# Patient Record
Sex: Female | Born: 1990 | Race: Black or African American | Hispanic: No | Marital: Single | Smoking: Current every day smoker
Health system: Southern US, Community
[De-identification: ages and names within clinical notes are randomized; demographics above are authoritative.]

## PROBLEM LIST (undated history)

## (undated) ENCOUNTER — Inpatient Hospital Stay (HOSPITAL_COMMUNITY): Payer: Self-pay

## (undated) DIAGNOSIS — A749 Chlamydial infection, unspecified: Secondary | ICD-10-CM

## (undated) DIAGNOSIS — I4891 Unspecified atrial fibrillation: Secondary | ICD-10-CM

## (undated) DIAGNOSIS — I10 Essential (primary) hypertension: Secondary | ICD-10-CM

## (undated) DIAGNOSIS — E0500 Thyrotoxicosis with diffuse goiter without thyrotoxic crisis or storm: Secondary | ICD-10-CM

## (undated) DIAGNOSIS — O1490 Unspecified pre-eclampsia, unspecified trimester: Secondary | ICD-10-CM

## (undated) DIAGNOSIS — M940 Chondrocostal junction syndrome [Tietze]: Secondary | ICD-10-CM

## (undated) DIAGNOSIS — E079 Disorder of thyroid, unspecified: Secondary | ICD-10-CM

## (undated) DIAGNOSIS — I517 Cardiomegaly: Secondary | ICD-10-CM

## (undated) DIAGNOSIS — R079 Chest pain, unspecified: Secondary | ICD-10-CM

## (undated) DIAGNOSIS — E0590 Thyrotoxicosis, unspecified without thyrotoxic crisis or storm: Secondary | ICD-10-CM

## (undated) DIAGNOSIS — A599 Trichomoniasis, unspecified: Secondary | ICD-10-CM

## (undated) HISTORY — PX: TUBAL LIGATION: SHX77

## (undated) HISTORY — DX: Chest pain, unspecified: R07.9

## (undated) HISTORY — DX: Cardiomegaly: I51.7

## (undated) HISTORY — PX: IUD REMOVAL: SHX5392

## (undated) HISTORY — DX: Chondrocostal junction syndrome (tietze): M94.0

---

## 2009-05-21 ENCOUNTER — Inpatient Hospital Stay (HOSPITAL_COMMUNITY): Admission: AD | Admit: 2009-05-21 | Discharge: 2009-05-21 | Payer: Self-pay | Admitting: Obstetrics and Gynecology

## 2009-06-01 ENCOUNTER — Ambulatory Visit: Payer: Self-pay | Admitting: Family

## 2009-06-01 ENCOUNTER — Inpatient Hospital Stay (HOSPITAL_COMMUNITY): Admission: AD | Admit: 2009-06-01 | Discharge: 2009-06-01 | Payer: Self-pay | Admitting: Obstetrics and Gynecology

## 2009-06-17 ENCOUNTER — Inpatient Hospital Stay (HOSPITAL_COMMUNITY): Admission: AD | Admit: 2009-06-17 | Discharge: 2009-06-17 | Payer: Self-pay | Admitting: Internal Medicine

## 2009-06-25 ENCOUNTER — Inpatient Hospital Stay (HOSPITAL_COMMUNITY): Admission: AD | Admit: 2009-06-25 | Discharge: 2009-06-26 | Payer: Self-pay | Admitting: Obstetrics and Gynecology

## 2009-07-24 ENCOUNTER — Ambulatory Visit: Payer: Self-pay | Admitting: Family

## 2009-07-24 ENCOUNTER — Inpatient Hospital Stay (HOSPITAL_COMMUNITY): Admission: AD | Admit: 2009-07-24 | Discharge: 2009-07-24 | Payer: Self-pay | Admitting: Obstetrics & Gynecology

## 2009-08-28 ENCOUNTER — Ambulatory Visit (HOSPITAL_COMMUNITY): Admission: RE | Admit: 2009-08-28 | Discharge: 2009-08-28 | Payer: Self-pay | Admitting: Obstetrics

## 2009-10-23 ENCOUNTER — Inpatient Hospital Stay (HOSPITAL_COMMUNITY): Admission: AD | Admit: 2009-10-23 | Discharge: 2009-10-27 | Payer: Self-pay | Admitting: Obstetrics & Gynecology

## 2009-11-01 ENCOUNTER — Emergency Department (HOSPITAL_COMMUNITY): Admission: EM | Admit: 2009-11-01 | Discharge: 2009-11-02 | Payer: Self-pay | Admitting: Emergency Medicine

## 2010-11-16 LAB — COMPREHENSIVE METABOLIC PANEL
ALT: 10 U/L (ref 0–35)
AST: 14 U/L (ref 0–37)
Albumin: 2.6 g/dL — ABNORMAL LOW (ref 3.5–5.2)
Alkaline Phosphatase: 152 U/L — ABNORMAL HIGH (ref 39–117)
BUN: 5 mg/dL — ABNORMAL LOW (ref 6–23)
Calcium: 9.4 mg/dL (ref 8.4–10.5)
Creatinine, Ser: 0.49 mg/dL (ref 0.4–1.2)
Glucose, Bld: 82 mg/dL (ref 70–99)
Potassium: 4 mEq/L (ref 3.5–5.1)
Sodium: 138 mEq/L (ref 135–145)
Total Bilirubin: 0.3 mg/dL (ref 0.3–1.2)
Total Protein: 6.1 g/dL (ref 6.0–8.3)

## 2010-11-16 LAB — CBC
HCT: 29.3 % — ABNORMAL LOW (ref 36.0–46.0)
Hemoglobin: 11.5 g/dL — ABNORMAL LOW (ref 12.0–15.0)
Hemoglobin: 9.8 g/dL — ABNORMAL LOW (ref 12.0–15.0)
MCHC: 32.7 g/dL (ref 30.0–36.0)
MCV: 85 fL (ref 78.0–100.0)
MCV: 85.6 fL (ref 78.0–100.0)
Platelets: 178 10*3/uL (ref 150–400)
Platelets: 209 10*3/uL (ref 150–400)
RBC: 4.09 MIL/uL (ref 3.87–5.11)
RDW: 15.6 % — ABNORMAL HIGH (ref 11.5–15.5)
RDW: 15.6 % — ABNORMAL HIGH (ref 11.5–15.5)
WBC: 14.4 10*3/uL — ABNORMAL HIGH (ref 4.0–10.5)
WBC: 16.2 10*3/uL — ABNORMAL HIGH (ref 4.0–10.5)

## 2010-11-16 LAB — MRSA PCR SCREENING: MRSA by PCR: NEGATIVE

## 2010-11-16 LAB — LACTATE DEHYDROGENASE: LDH: 108 U/L (ref 94–250)

## 2010-11-24 LAB — WET PREP, GENITAL: Clue Cells Wet Prep HPF POC: NONE SEEN

## 2010-11-25 LAB — URINALYSIS, ROUTINE W REFLEX MICROSCOPIC
Bilirubin Urine: NEGATIVE
Hgb urine dipstick: NEGATIVE
Nitrite: NEGATIVE
Specific Gravity, Urine: >1.03 — ABNORMAL HIGH (ref 1.005–1.030)
pH: 6 (ref 5.0–8.0)

## 2010-11-25 LAB — GC/CHLAMYDIA PROBE AMP, GENITAL: GC Probe Amp, Genital: NEGATIVE

## 2010-11-25 LAB — DIFFERENTIAL
Basophils Relative: 0 % (ref 0–1)
Lymphocytes Relative: 28 % (ref 12–46)
Lymphs Abs: 3 10*3/uL (ref 0.7–4.0)
Neutrophils Relative %: 64 % (ref 43–77)

## 2010-11-25 LAB — WET PREP, GENITAL
Clue Cells Wet Prep HPF POC: NONE SEEN
Trich, Wet Prep: NONE SEEN

## 2010-11-25 LAB — CBC
MCV: 87.2 fL (ref 78.0–100.0)
WBC: 10.6 10*3/uL — ABNORMAL HIGH (ref 4.0–10.5)

## 2010-11-25 LAB — SICKLE CELL SCREEN: Sickle Cell Screen: NEGATIVE

## 2010-11-25 LAB — TYPE AND SCREEN: Antibody Screen: NEGATIVE

## 2010-11-25 LAB — HEPATITIS B SURFACE ANTIGEN: Hepatitis B Surface Ag: NEGATIVE

## 2010-11-26 LAB — URINALYSIS, ROUTINE W REFLEX MICROSCOPIC
Glucose, UA: NEGATIVE mg/dL
Nitrite: POSITIVE — AB

## 2010-11-26 LAB — URINE CULTURE: Colony Count: >=100000

## 2010-11-26 LAB — URINE MICROSCOPIC-ADD ON

## 2010-11-27 LAB — URINALYSIS, ROUTINE W REFLEX MICROSCOPIC
Bilirubin Urine: NEGATIVE
Glucose, UA: NEGATIVE mg/dL
Ketones, ur: NEGATIVE mg/dL
Nitrite: NEGATIVE
pH: 7 (ref 5.0–8.0)

## 2010-11-27 LAB — WET PREP, GENITAL
Trich, Wet Prep: NONE SEEN
Yeast Wet Prep HPF POC: NONE SEEN

## 2010-11-27 LAB — CBC
Platelets: 231 10*3/uL (ref 150–400)
WBC: 8.8 10*3/uL (ref 4.0–10.5)

## 2011-01-29 ENCOUNTER — Emergency Department (HOSPITAL_COMMUNITY)
Admission: EM | Admit: 2011-01-29 | Discharge: 2011-01-29 | Disposition: A | Discharge status: Home / Self Care | Payer: Self-pay | Attending: Emergency Medicine | Admitting: Emergency Medicine

## 2011-01-29 DIAGNOSIS — O200 Threatened abortion: Secondary | ICD-10-CM | POA: Insufficient documentation

## 2011-01-29 DIAGNOSIS — I10 Essential (primary) hypertension: Secondary | ICD-10-CM | POA: Insufficient documentation

## 2011-01-29 LAB — URINALYSIS, ROUTINE W REFLEX MICROSCOPIC
Protein, ur: NEGATIVE mg/dL
Urobilinogen, UA: 1 mg/dL (ref 0.0–1.0)

## 2011-01-29 LAB — URINE MICROSCOPIC-ADD ON

## 2011-01-29 LAB — POCT PREGNANCY, URINE: Preg Test, Ur: POSITIVE

## 2011-01-29 LAB — HCG, QUANTITATIVE, PREGNANCY: hCG, Beta Chain, Quant, S: 549 m[IU]/mL — ABNORMAL HIGH (ref <5)

## 2011-01-29 LAB — WET PREP, GENITAL

## 2011-02-01 ENCOUNTER — Inpatient Hospital Stay (HOSPITAL_COMMUNITY): Payer: Self-pay

## 2011-02-01 ENCOUNTER — Inpatient Hospital Stay (HOSPITAL_COMMUNITY)
Admission: AD | Admit: 2011-02-01 | Discharge: 2011-02-01 | Disposition: A | Discharge status: Home / Self Care | Payer: Self-pay | Source: Ambulatory Visit | Attending: Obstetrics & Gynecology | Admitting: Obstetrics & Gynecology

## 2011-02-01 DIAGNOSIS — R52 Pain, unspecified: Secondary | ICD-10-CM

## 2011-02-01 DIAGNOSIS — O039 Complete or unspecified spontaneous abortion without complication: Secondary | ICD-10-CM

## 2011-02-01 LAB — WET PREP, GENITAL
Trich, Wet Prep: NONE SEEN
Yeast Wet Prep HPF POC: NONE SEEN

## 2011-02-01 LAB — CBC
Hemoglobin: 12.2 g/dL (ref 12.0–15.0)
MCHC: 32.5 g/dL (ref 30.0–36.0)
RBC: 4.46 MIL/uL (ref 3.87–5.11)

## 2011-02-01 LAB — HCG, QUANTITATIVE, PREGNANCY: hCG, Beta Chain, Quant, S: 80 m[IU]/mL — ABNORMAL HIGH (ref <5)

## 2011-02-03 LAB — GC/CHLAMYDIA PROBE AMP, GENITAL: Chlamydia, DNA Probe: UNDETERMINED

## 2011-02-08 ENCOUNTER — Other Ambulatory Visit: Payer: Self-pay

## 2011-02-08 DIAGNOSIS — Z0189 Encounter for other specified special examinations: Secondary | ICD-10-CM

## 2011-05-12 ENCOUNTER — Emergency Department (HOSPITAL_COMMUNITY)
Admission: EM | Admit: 2011-05-12 | Discharge: 2011-05-12 | Disposition: A | Discharge status: Home / Self Care | Payer: Medicaid Other | Attending: Emergency Medicine | Admitting: Emergency Medicine

## 2011-05-12 DIAGNOSIS — Z87891 Personal history of nicotine dependence: Secondary | ICD-10-CM | POA: Insufficient documentation

## 2011-05-12 DIAGNOSIS — I10 Essential (primary) hypertension: Secondary | ICD-10-CM | POA: Insufficient documentation

## 2011-05-12 DIAGNOSIS — R21 Rash and other nonspecific skin eruption: Secondary | ICD-10-CM | POA: Insufficient documentation

## 2011-10-04 ENCOUNTER — Encounter (HOSPITAL_COMMUNITY): Payer: Self-pay

## 2011-10-04 ENCOUNTER — Emergency Department (INDEPENDENT_AMBULATORY_CARE_PROVIDER_SITE_OTHER)
Admission: EM | Admit: 2011-10-04 | Discharge: 2011-10-04 | Disposition: A | Discharge status: Home / Self Care | Payer: Medicaid Other | Source: Home / Self Care | Attending: Family Medicine | Admitting: Family Medicine

## 2011-10-04 DIAGNOSIS — B86 Scabies: Secondary | ICD-10-CM

## 2011-10-04 MED ORDER — PERMETHRIN 5 % EX CREA: TOPICAL_CREAM | Freq: Once | CUTANEOUS | Status: AC

## 2011-10-04 NOTE — ED Provider Notes (Signed)
Marilyn Norman is a 21 y.o. female who presents to Urgent Care today for itching and bumps on her skin since August 2012.  Seen in ED at that time and told to take Benadryl for relief.  Not helping.  Sleeps with son occasionally who also has some itching on his skin and bumps.  No concurrent or prior illnesses.     PMH reviewed.  ROS as above otherwise neg Medications reviewed. No current facility-administered medications for this encounter.   Current Outpatient Prescriptions  Medication Sig Dispense Refill  . permethrin (ELIMITE) 5 % cream Apply topically once.  60 g  0    Exam:  BP 136/66  Pulse 66  Temp(Src) 98.3 F (36.8 C) (Oral)  Resp 18  SpO2 100%  LMP 09/22/2011 Gen: Well NAD HEENT: EOMI,  MMM Lungs: CTABL Nl WOB Heart: RRR no MRG Abd: NABS, NT, ND Exts: Non edematous BL  LE, warm and well perfused.  Skin:  Multiple papules scattered throughout bilateral upper extremities and lower extremities.  None noted on back.  Some excoriations noted.  Burrows noted.  Assessment and Plan: 1.  Scabies:  Plan to treat with Permethrin 5% cream.  Provided instructions.  Follow-up if no improvement. 2.  No PCP:  Provided resource sheet with list of PCP's in area.     Renold Don, MD 10/04/11 [phone]

## 2011-10-04 NOTE — ED Notes (Signed)
Diffuse reddened raise rash

## 2011-10-06 NOTE — ED Provider Notes (Signed)
Medical screening examination/treatment/procedure(s) were performed by PGY-3 FM resident and as supervising physician I was immediately available for consultation/collaboration.   Sharin Grave, MD   Sharin Grave, MD 10/06/11 [phone]

## 2012-03-17 ENCOUNTER — Encounter (HOSPITAL_COMMUNITY): Payer: Self-pay | Admitting: Emergency Medicine

## 2012-03-17 ENCOUNTER — Emergency Department (HOSPITAL_COMMUNITY)
Admission: EM | Admit: 2012-03-17 | Discharge: 2012-03-17 | Disposition: A | Discharge status: Home / Self Care | Payer: Medicaid Other | Attending: Emergency Medicine | Admitting: Emergency Medicine

## 2012-03-17 DIAGNOSIS — N921 Excessive and frequent menstruation with irregular cycle: Secondary | ICD-10-CM

## 2012-03-17 DIAGNOSIS — F17200 Nicotine dependence, unspecified, uncomplicated: Secondary | ICD-10-CM | POA: Insufficient documentation

## 2012-03-17 DIAGNOSIS — I10 Essential (primary) hypertension: Secondary | ICD-10-CM | POA: Insufficient documentation

## 2012-03-17 HISTORY — DX: Essential (primary) hypertension: I10

## 2012-03-17 LAB — URINALYSIS, ROUTINE W REFLEX MICROSCOPIC
Bilirubin Urine: NEGATIVE
Nitrite: NEGATIVE
Specific Gravity, Urine: 1.02 (ref 1.005–1.030)
pH: 6 (ref 5.0–8.0)

## 2012-03-17 LAB — URINE MICROSCOPIC-ADD ON

## 2012-03-17 LAB — POCT PREGNANCY, URINE: Preg Test, Ur: NEGATIVE

## 2012-03-17 NOTE — ED Provider Notes (Signed)
History    This chart was scribed for Joya Gaskins, MD by Sofie Rower. The patient was seen in room TR09C/TR09C and the patient's care was started at 11:28 AM    CSN: [id number]  Arrival date & time 03/17/12  1052   First MD Initiated Contact with Patient 03/17/12 1124      Chief Complaint  Patient presents with  . Late Period    Patient is a 21 y.o. female presenting with back pain. The history is provided by the patient. No language interpreter was used.  Back Pain  This is a new problem. The current episode started more than 1 week ago. The problem has not changed since onset.The pain is associated with no known injury. The pain is moderate. Pertinent negatives include no abdominal pain and no dysuria. She has tried nothing for the symptoms. The treatment provided no relief.   The pt informs the EDP that she is late in having her period, has not had one yet this month. LNMP 01/22/12.   Pt denies increased urinary frequency.     Past Medical History  Diagnosis Date  . Hypertension     Past Surgical History  Procedure Date  . Cesarean section     No family history on file.  History  Substance Use Topics  . Smoking status: Current Everyday Smoker  . Smokeless tobacco: Not on file  . Alcohol Use: Yes    OB History    Grav Para Term Preterm Abortions TAB SAB Ect Mult Living                  Review of Systems  Gastrointestinal: Negative for abdominal pain.  Genitourinary: Negative for dysuria.  Musculoskeletal: Positive for back pain.    Allergies  Strawberry  Home Medications   Current Outpatient Rx  Name Route Sig Dispense Refill  . ACETAMINOPHEN 500 MG PO TABS Oral Take 1,000 mg by mouth every 6 (six) hours as needed. For pain      BP 155/107  Pulse 65  Temp 98.7 F (37.1 C) (Oral)  Resp 20  SpO2 98%  LMP 01/22/2012  Physical Exam  CONSTITUTIONAL: Well developed/well nourished HEAD AND FACE: Normocephalic/atraumatic EYES:  EOMI/PERRL ENMT: Mucous membranes moist NECK: supple no meningeal signs SPINE:entire spine nontender CV: S1/S2 noted, no murmurs/rubs/gallops noted LUNGS: Lungs are clear to auscultation bilaterally, no apparent distress ABDOMEN: soft, nontender, no rebound or guarding GU:no cva tenderness NEURO: Pt is awake/alert, moves all extremitiesx4 EXTREMITIES: pulses normal, full ROM SKIN: warm, color normal PSYCH: no abnormalities of mood noted   ED Course  Procedures   DIAGNOSTIC STUDIES: Oxygen Saturation is 98% on room air, normal by my interpretation.    COORDINATION OF CARE:     11:31AM- EDP at bedside discusses treatment plan concerning pregnancy test.  11:46AM- EDP at bedside to discuss laboratory results.  Pt denies abdominal pain and denies vaginal bleeding Referred to GYN for followup Repots mild low back pain but exam unremarkable, no focal neuro deficits  Results for orders placed during the hospital encounter of 03/17/12  POCT PREGNANCY, URINE      Component Value Range   Preg Test, Ur NEGATIVE  NEGATIVE      1. Metrorrhagia       MDM  Nursing notes including past medical history and social history reviewed and considered in documentation labs/vitals reviewed and considered      I personally performed the services described in this documentation, which was scribed in my  presence. The recorded information has been reviewed and considered.       Joya Gaskins, MD 03/17/12 1148

## 2012-03-17 NOTE — ED Notes (Signed)
Pt reports that her period has been irregular and that she did not get a period this month. LMP 01/22/2012

## 2012-04-01 ENCOUNTER — Emergency Department (HOSPITAL_COMMUNITY): Payer: Medicaid Other

## 2012-04-01 ENCOUNTER — Encounter (HOSPITAL_COMMUNITY): Payer: Self-pay | Admitting: Emergency Medicine

## 2012-04-01 ENCOUNTER — Other Ambulatory Visit: Payer: Self-pay

## 2012-04-01 ENCOUNTER — Emergency Department (HOSPITAL_COMMUNITY)
Admission: EM | Admit: 2012-04-01 | Discharge: 2012-04-01 | Disposition: A | Discharge status: Home / Self Care | Payer: Medicaid Other | Attending: Emergency Medicine | Admitting: Emergency Medicine

## 2012-04-01 DIAGNOSIS — F17200 Nicotine dependence, unspecified, uncomplicated: Secondary | ICD-10-CM | POA: Insufficient documentation

## 2012-04-01 DIAGNOSIS — I10 Essential (primary) hypertension: Secondary | ICD-10-CM | POA: Insufficient documentation

## 2012-04-01 DIAGNOSIS — R079 Chest pain, unspecified: Secondary | ICD-10-CM | POA: Insufficient documentation

## 2012-04-01 LAB — CBC
HCT: 38.6 % (ref 36.0–46.0)
Hemoglobin: 13.2 g/dL (ref 12.0–15.0)
MCH: 29.1 pg (ref 26.0–34.0)
MCHC: 34.2 g/dL (ref 30.0–36.0)

## 2012-04-01 LAB — BASIC METABOLIC PANEL
BUN: 11 mg/dL (ref 6–23)
CO2: 25 mEq/L (ref 19–32)
Calcium: 9.3 mg/dL (ref 8.4–10.5)
GFR calc non Af Amer: >90 mL/min (ref >90)
Glucose, Bld: 83 mg/dL (ref 70–99)
Potassium: 4 mEq/L (ref 3.5–5.1)

## 2012-04-01 NOTE — ED Provider Notes (Signed)
History     CSN: [id number]  Arrival date & time 04/01/12  1059   First MD Initiated Contact with Patient 04/01/12 1126      Chief Complaint  Patient presents with  . Chest Pain    (Consider location/radiation/quality/duration/timing/severity/associated sxs/prior treatment) HPI  21 y.o. female in no acute distress with past medical history significant for hypertension complaining of left-sided chest pain worsening over the course of one week. Pain is described as 8/10 pressure-like with no radiation and worsened by movement and palpation. Patient denies any family history of early cardiac death, trauma, shortness of breath, nausea or vomiting.    Past Medical History  Diagnosis Date  . Hypertension     Past Surgical History  Procedure Date  . Cesarean section     History reviewed. No pertinent family history.  History  Substance Use Topics  . Smoking status: Current Everyday Smoker  . Smokeless tobacco: Not on file  . Alcohol Use: Yes    OB History    Grav Para Term Preterm Abortions TAB SAB Ect Mult Living                  Review of Systems  Respiratory: Negative for shortness of breath.   Cardiovascular: Positive for chest pain.  All other systems reviewed and are negative.    Allergies  Strawberry  Home Medications  No current outpatient prescriptions on file.  BP 158/90  Pulse 63  Temp 98.5 F (36.9 C) (Oral)  Resp 20  SpO2 99%  LMP 03/16/2012  Physical Exam  Vitals reviewed. Constitutional: She is oriented to person, place, and time. She appears well-developed and well-nourished. No distress.  HENT:  Head: Normocephalic.  Eyes: Conjunctivae and EOM are normal.  Cardiovascular: Normal rate, normal heart sounds and intact distal pulses.   Pulmonary/Chest: Effort normal and breath sounds normal. No respiratory distress. She has no wheezes. She has no rales. She exhibits tenderness.       Patient has reproducible chest pain to the upper left  chest. No crepitance  Musculoskeletal: Normal range of motion.  Neurological: She is alert and oriented to person, place, and time.  Psychiatric: She has a normal mood and affect.    ED Course  Procedures (including critical care time)   Labs Reviewed  CBC  BASIC METABOLIC PANEL  POCT I-STAT TROPONIN I   Dg Chest 2 View  04/01/2012  *RADIOLOGY REPORT*  Clinical Data: 3-day history of left-sided chest pain.  Smoker.  CHEST - 2 VIEW  Comparison: None.  Findings: Heart size upper normal to slightly enlarged.  Hilar and mediastinal contours otherwise unremarkable.  Lungs clear. Bronchovascular markings normal.  Pulmonary vascularity normal.  No pneumothorax.  No pleural effusions.  Visualized bony thorax intact.  IMPRESSION: Borderline heart size.  No acute cardiopulmonary disease.  Original Report Authenticated By: Arnell Sieving, M.D.     1. Chest pain       MDM  EKG and chest x-ray show no acute processes however chest x-ray shows borderline enlargement of the heart. I doubt any acute process at this point and we'll treat her as a costochondritis however I will advise her to follow immediately with primary care to evaluate her cardiomegaly in light of her hypertension at such a young age. Will also counsel her on smoking cessation.   Date: 04/01/2012  Rate: 75  Rhythm: normal sinus rhythm  QRS Axis: normal  Intervals: normal  ST/T Wave abnormalities: normal  Conduction Disutrbances:none  Narrative  Interpretation:   Old EKG Reviewed: unchanged          Wynetta Emery, PA-C 04/01/12 1244

## 2012-04-01 NOTE — ED Notes (Signed)
Pt c/o left sided CP x 1 week worse with palpation and with laying down

## 2012-04-02 NOTE — ED Provider Notes (Signed)
Medical screening examination/treatment/procedure(s) were performed by non-physician practitioner and as supervising physician I was immediately available for consultation/collaboration.  Derwood Kaplan, MD 04/02/12 1723

## 2012-04-05 ENCOUNTER — Encounter: Payer: Self-pay | Admitting: *Deleted

## 2012-04-05 DIAGNOSIS — R079 Chest pain, unspecified: Secondary | ICD-10-CM | POA: Insufficient documentation

## 2012-04-05 DIAGNOSIS — I10 Essential (primary) hypertension: Secondary | ICD-10-CM | POA: Insufficient documentation

## 2012-04-06 ENCOUNTER — Encounter: Payer: Self-pay | Admitting: *Deleted

## 2012-04-07 ENCOUNTER — Ambulatory Visit (INDEPENDENT_AMBULATORY_CARE_PROVIDER_SITE_OTHER): Payer: Medicaid Other | Admitting: Cardiovascular Disease

## 2012-04-07 ENCOUNTER — Encounter: Payer: Self-pay | Admitting: Cardiovascular Disease

## 2012-04-07 VITALS — BP 164/103 | HR 73 | Ht 64.0 in

## 2012-04-07 DIAGNOSIS — I10 Essential (primary) hypertension: Secondary | ICD-10-CM

## 2012-04-07 DIAGNOSIS — R079 Chest pain, unspecified: Secondary | ICD-10-CM

## 2012-04-07 NOTE — Patient Instructions (Signed)
Your physician recommends that you schedule a follow-up appointment in: 2-3 WEEKS WITH [facility] Your physician recommends that you continue on your current medications as directed. Please refer to the Current Medication list given to you today. Your physician has requested that you have an echocardiogram. Echocardiography is a painless test that uses sound waves to create images of your heart. It provides your doctor with information about the size and shape of your heart and how well your heart's chambers and valves are working. This procedure takes approximately one hour. There are no restrictions for this procedure. DX  401.1

## 2012-04-07 NOTE — Assessment & Plan Note (Signed)
Atypical No need for ETT unless echo abnormal

## 2012-04-07 NOTE — Progress Notes (Signed)
Patient ID: Marilyn Norman, female   DOB: 03-30-1991, 21 y.o.   MRN: [id number] 21 y.o. female in no acute distress with past medical history significant for hypertension complaining of left-sided chest pain worsening over the course of one week. Pain is described as 8/10 pressure-like with no radiation and worsened by movement and palpation. Patient denies any family history of early cardiac death, trauma, shortness of breath, nausea or vomiting.  Seen in ER 8/10 with r/o  Mild CE on CXR.  Conitnues to have atypical pain last 6 days.  Describes labile BP since pregnant 5 years ago.  Not on meds.  Father died in 13-Feb-2023 of heart disease but did drugs.  Denies dyspnea, palitations, syncope chronic kidney disease.  Cr .76 in ER and other labs normal.    ROS: Denies fever, malais, weight loss, blurry vision, decreased visual acuity, cough, sputum, SOB, hemoptysis, pleuritic pain, palpitaitons, heartburn, abdominal pain, melena, lower extremity edema, claudication, or rash.  All other systems reviewed and negative   General: Affect appropriate Healthy:  appears stated age HEENT: normal Neck supple with no adenopathy JVP normal no bruits no thyromegaly Lungs clear with no wheezing and good diaphragmatic motion Heart:  S1/S2 no murmur,rub, gallop or click PMI normal Abdomen: benighn, BS positve, no tenderness, no AAA no bruit.  No HSM or HJR Distal pulses intact with no bruits No edema Neuro non-focal Skin warm and dry No muscular weakness  Medications Current Outpatient Prescriptions  Medication Sig Dispense Refill  . ibuprofen (ADVIL,MOTRIN) 200 MG tablet Take 200 mg by mouth every 6 (six) hours as needed.        Allergies Strawberry  Family History: No family history on file.  Social History: History   Social History  . Marital Status: Single    Spouse Name: N/A    Number of Children: N/A  . Years of Education: N/A   Occupational History  . Not on file.   Social History Main  Topics  . Smoking status: Current Everyday Smoker  . Smokeless tobacco: Not on file  . Alcohol Use: No  . Drug Use: Yes    Special: Marijuana  . Sexually Active: Not on file   Other Topics Concern  . Not on file   Social History Narrative  . No narrative on file    Electrocardiogram:  8/10  SR rate 71 normal with no LVH  Assessment and Plan

## 2012-04-07 NOTE — Assessment & Plan Note (Signed)
Will likely need Rx.  Echo for LV size and function with ? CE .  She has BP cuff at home Will bring it next visit and learn how to use it Likely start Cozaar 50 mg Low sodium diet

## 2012-04-12 ENCOUNTER — Ambulatory Visit (HOSPITAL_COMMUNITY): Payer: Medicaid Other | Attending: Cardiovascular Disease | Admitting: Radiology

## 2012-04-12 DIAGNOSIS — F17200 Nicotine dependence, unspecified, uncomplicated: Secondary | ICD-10-CM | POA: Insufficient documentation

## 2012-04-12 DIAGNOSIS — I10 Essential (primary) hypertension: Secondary | ICD-10-CM

## 2012-04-12 DIAGNOSIS — I059 Rheumatic mitral valve disease, unspecified: Secondary | ICD-10-CM | POA: Insufficient documentation

## 2012-04-12 DIAGNOSIS — R072 Precordial pain: Secondary | ICD-10-CM

## 2012-04-12 DIAGNOSIS — R079 Chest pain, unspecified: Secondary | ICD-10-CM | POA: Insufficient documentation

## 2012-04-12 NOTE — Progress Notes (Signed)
Echocardiogram performed.  

## 2012-04-27 DIAGNOSIS — M940 Chondrocostal junction syndrome [Tietze]: Secondary | ICD-10-CM | POA: Insufficient documentation

## 2012-04-27 DIAGNOSIS — I517 Cardiomegaly: Secondary | ICD-10-CM | POA: Insufficient documentation

## 2012-04-28 ENCOUNTER — Ambulatory Visit: Payer: Medicaid Other | Admitting: Cardiovascular Disease

## 2012-05-17 ENCOUNTER — Encounter: Payer: Self-pay | Admitting: Cardiovascular Disease

## 2012-05-17 ENCOUNTER — Ambulatory Visit: Payer: Medicaid Other | Admitting: Cardiovascular Disease

## 2012-05-17 ENCOUNTER — Ambulatory Visit (INDEPENDENT_AMBULATORY_CARE_PROVIDER_SITE_OTHER): Payer: Medicaid Other | Admitting: Cardiovascular Disease

## 2012-05-17 VITALS — BP 152/98 | HR 79 | Ht 64.0 in | Wt 214.8 lb

## 2012-05-17 DIAGNOSIS — R079 Chest pain, unspecified: Secondary | ICD-10-CM

## 2012-05-17 DIAGNOSIS — I10 Essential (primary) hypertension: Secondary | ICD-10-CM

## 2012-05-17 NOTE — Assessment & Plan Note (Signed)
Atypical normal stress echo observe 

## 2012-05-17 NOTE — Progress Notes (Signed)
Patient ID: Marilyn Norman, female   DOB: [birth year], 21 y.o.   MRN: [id number] 21 y.o. female in no acute distress with past medical history significant for hypertension complaining of left-sided chest pain worsening over the course of one week. Pain is described as 8/10 pressure-like with no radiation and worsened by movement and palpation. Patient denies any family history of early cardiac death, trauma, shortness of breath, nausea or vomiting. Seen in ER 8/10 with r/o Mild CE on CXR. Conitnues to have atypical pain last 6 days. Describes labile BP since pregnant 5 years ago. Not on meds. Father died in 02-09-23 of heart disease but did drugs. Denies dyspnea, palitations, syncope chronic kidney disease. Cr .76 in ER and other labs normal.   Stress echo done 8/22 normal.  ROS: Denies fever, malais, weight loss, blurry vision, decreased visual acuity, cough, sputum, SOB, hemoptysis, pleuritic pain, palpitaitons, heartburn, abdominal pain, melena, lower extremity edema, claudication, or rash.  All other systems reviewed and negative  General: Affect appropriate Healthy:  appears stated age HEENT: normal Neck supple with no adenopathy JVP normal no bruits no thyromegaly Lungs clear with no wheezing and good diaphragmatic motion Heart:  S1/S2 no murmur, no rub, gallop or click PMI normal Abdomen: benighn, BS positve, no tenderness, no AAA no bruit.  No HSM or HJR Distal pulses intact with no bruits No edema Neuro non-focal Skin warm and dry No muscular weakness   Current Outpatient Prescriptions  Medication Sig Dispense Refill  . HYDROcodone-acetaminophen (NORCO/VICODIN) 5-325 MG per tablet Take 1 tablet by mouth every 6 (six) hours as needed.      Marland Kitchen ibuprofen (ADVIL,MOTRIN) 200 MG tablet Take 200 mg by mouth every 6 (six) hours as needed.        Allergies  Strawberry  Electrocardiogram:  Assessment and Plan

## 2012-05-17 NOTE — Patient Instructions (Signed)
Your physician recommends that you schedule a follow-up appointment in: AS NEEDED  Your physician recommends that you continue on your current medications as directed. Please refer to the Current Medication list given to you today.  

## 2012-05-17 NOTE — Assessment & Plan Note (Signed)
Well controlled.  Continue current medications and low sodium Dash type diet.    

## 2012-10-11 ENCOUNTER — Emergency Department (HOSPITAL_COMMUNITY): Payer: Medicaid Other

## 2012-10-11 ENCOUNTER — Other Ambulatory Visit: Payer: Self-pay | Admitting: Family Medicine

## 2012-10-11 ENCOUNTER — Emergency Department (HOSPITAL_COMMUNITY)
Admission: EM | Admit: 2012-10-11 | Discharge: 2012-10-11 | Disposition: A | Discharge status: Home / Self Care | Payer: Medicaid Other | Attending: Emergency Medicine | Admitting: Emergency Medicine

## 2012-10-11 ENCOUNTER — Encounter (HOSPITAL_COMMUNITY): Payer: Self-pay | Admitting: *Deleted

## 2012-10-11 DIAGNOSIS — N949 Unspecified condition associated with female genital organs and menstrual cycle: Secondary | ICD-10-CM | POA: Insufficient documentation

## 2012-10-11 DIAGNOSIS — O9989 Other specified diseases and conditions complicating pregnancy, childbirth and the puerperium: Secondary | ICD-10-CM | POA: Insufficient documentation

## 2012-10-11 DIAGNOSIS — I10 Essential (primary) hypertension: Secondary | ICD-10-CM | POA: Insufficient documentation

## 2012-10-11 DIAGNOSIS — Z8679 Personal history of other diseases of the circulatory system: Secondary | ICD-10-CM | POA: Insufficient documentation

## 2012-10-11 DIAGNOSIS — N898 Other specified noninflammatory disorders of vagina: Secondary | ICD-10-CM | POA: Insufficient documentation

## 2012-10-11 DIAGNOSIS — Z8739 Personal history of other diseases of the musculoskeletal system and connective tissue: Secondary | ICD-10-CM | POA: Insufficient documentation

## 2012-10-11 DIAGNOSIS — O98319 Other infections with a predominantly sexual mode of transmission complicating pregnancy, unspecified trimester: Secondary | ICD-10-CM | POA: Insufficient documentation

## 2012-10-11 DIAGNOSIS — R109 Unspecified abdominal pain: Secondary | ICD-10-CM | POA: Insufficient documentation

## 2012-10-11 DIAGNOSIS — Z87891 Personal history of nicotine dependence: Secondary | ICD-10-CM | POA: Insufficient documentation

## 2012-10-11 LAB — WET PREP, GENITAL
WBC, Wet Prep HPF POC: NONE SEEN
Yeast Wet Prep HPF POC: NONE SEEN

## 2012-10-11 LAB — HCG, QUANTITATIVE, PREGNANCY: hCG, Beta Chain, Quant, S: 733 m[IU]/mL — ABNORMAL HIGH (ref <5)

## 2012-10-11 LAB — URINALYSIS, ROUTINE W REFLEX MICROSCOPIC
Nitrite: NEGATIVE
Protein, ur: NEGATIVE mg/dL
Specific Gravity, Urine: 1.026 (ref 1.005–1.030)
Urobilinogen, UA: 1 mg/dL (ref 0.0–1.0)

## 2012-10-11 LAB — CBC WITH DIFFERENTIAL/PLATELET
Basophils Absolute: 0 10*3/uL (ref 0.0–0.1)
Eosinophils Relative: 5 % (ref 0–5)
HCT: 37.4 % (ref 36.0–46.0)
Hemoglobin: 12.6 g/dL (ref 12.0–15.0)
Lymphocytes Relative: 35 % (ref 12–46)
Lymphs Abs: 2.8 10*3/uL (ref 0.7–4.0)
MCV: 84.6 fL (ref 78.0–100.0)
Monocytes Absolute: 0.8 10*3/uL (ref 0.1–1.0)
Monocytes Relative: 10 % (ref 3–12)
RDW: 13.9 % (ref 11.5–15.5)
WBC: 8 10*3/uL (ref 4.0–10.5)

## 2012-10-11 LAB — COMPREHENSIVE METABOLIC PANEL
BUN: 9 mg/dL (ref 6–23)
CO2: 26 mEq/L (ref 19–32)
Calcium: 9.3 mg/dL (ref 8.4–10.5)
Chloride: 109 mEq/L (ref 96–112)
Creatinine, Ser: 0.63 mg/dL (ref 0.50–1.10)
GFR calc Af Amer: >90 mL/min (ref >90)
GFR calc non Af Amer: >90 mL/min (ref >90)
Glucose, Bld: 91 mg/dL (ref 70–99)
Total Bilirubin: 0.4 mg/dL (ref 0.3–1.2)

## 2012-10-11 LAB — URINE MICROSCOPIC-ADD ON

## 2012-10-11 LAB — POCT PREGNANCY, URINE: Preg Test, Ur: POSITIVE — AB

## 2012-10-11 MED ORDER — METRONIDAZOLE 500 MG PO TABS
2000.0000 mg | ORAL_TABLET | Freq: Once | ORAL | Status: AC
  Administered 2012-10-11: 2000 mg via ORAL
  Filled 2012-10-11: qty 4

## 2012-10-11 MED ORDER — CEFTRIAXONE SODIUM 250 MG IJ SOLR
250.0000 mg | Freq: Once | INTRAMUSCULAR | Status: AC
  Administered 2012-10-11: 250 mg via INTRAMUSCULAR
  Filled 2012-10-11: qty 250

## 2012-10-11 MED ORDER — LIDOCAINE HCL (PF) 1 % IJ SOLN
INTRAMUSCULAR | Status: AC
  Administered 2012-10-11: 1.5 mL via NASAL
  Filled 2012-10-11: qty 5

## 2012-10-11 MED ORDER — AZITHROMYCIN 1 G PO PACK
1.0000 g | PACK | Freq: Once | ORAL | Status: AC
  Administered 2012-10-11: 1 g via ORAL
  Filled 2012-10-11: qty 1

## 2012-10-11 MED ORDER — FOLIC ACID 1 MG PO TABS: 1.0000 mg | ORAL_TABLET | Freq: Every day | ORAL | Status: DC

## 2012-10-11 NOTE — ED Provider Notes (Addendum)
History     CSN: [id number]  Arrival date & time 10/11/12  1043   First MD Initiated Contact with Patient 10/11/12 1120      Chief Complaint  Patient presents with  . Abdominal Pain    (Consider location/radiation/quality/duration/timing/severity/associated sxs/prior treatment) HPI Comments: G2P2 patient comes in with cc of abd pain and vaginal discharge. LMP was in Jan, and she was noted to have positive U preg (this will be her 3rd pregnancy). Pt reports that for the past few days she has been having suprapubic and LLQ abd pain, with clear, but foul smelling vaginal discharge. Pt has no n/v/f/c. No vaginal bleeding.   Patient is a 22 y.o. female presenting with abdominal pain. The history is provided by the patient.  Abdominal Pain Associated symptoms: vaginal discharge   Associated symptoms: no chest pain, no dysuria, no hematuria, no nausea, no shortness of breath, no vaginal bleeding and no vomiting     Past Medical History  Diagnosis Date  . Hypertension   . Chest pain   . Cardiomegaly   . Costochondritis     Past Surgical History  Procedure Laterality Date  . Cesarean section      No family history on file.  History  Substance Use Topics  . Smoking status: Former Games developer  . Smokeless tobacco: Not on file  . Alcohol Use: No    OB History   Grav Para Term Preterm Abortions TAB SAB Ect Mult Living                  Review of Systems  Constitutional: Negative for activity change.  HENT: Negative for neck pain.   Respiratory: Negative for shortness of breath.   Cardiovascular: Negative for chest pain.  Gastrointestinal: Positive for abdominal pain. Negative for nausea and vomiting.  Genitourinary: Positive for vaginal discharge and pelvic pain. Negative for dysuria, hematuria, flank pain and vaginal bleeding.  Neurological: Negative for headaches.    Allergies  Strawberry  Home Medications   Current Outpatient Rx  Name  Route  Sig  Dispense  Refill   . acetaminophen (TYLENOL) 500 MG tablet   Oral   Take 1,000 mg by mouth every 6 (six) hours as needed for pain.           BP 140/75  Pulse 70  Temp(Src) 98.3 F (36.8 C) (Oral)  Resp 17  SpO2 98%  LMP 09/09/2012  Physical Exam  Nursing note and vitals reviewed. Constitutional: She is oriented to person, place, and time. She appears well-developed and well-nourished.  HENT:  Head: Normocephalic and atraumatic.  Eyes: Conjunctivae and EOM are normal. Pupils are equal, round, and reactive to light.  Neck: Normal range of motion. Neck supple.  Cardiovascular: Normal rate, regular rhythm, normal heart sounds and intact distal pulses.   No murmur heard. Pulmonary/Chest: Effort normal. No respiratory distress. She has no wheezes.  Abdominal: Soft. Bowel sounds are normal. She exhibits no distension. There is tenderness. There is no rebound and no guarding.  Genitourinary: Vagina normal and uterus normal.  External exam - normal, no lesions Speculum exam: Pt has some white, foul smelling discharge, no blood Bimanual exam: Patient has no CMT, no adnexal tenderness or fullness and cervical os is closed  Neurological: She is alert and oriented to person, place, and time.  Skin: Skin is warm and dry.    ED Course  Procedures (including critical care time)  Labs Reviewed  COMPREHENSIVE METABOLIC PANEL - Abnormal; Notable for the following:  Albumin 3.2 (*)    ALT 50 (*)    All other components within normal limits  URINALYSIS, ROUTINE W REFLEX MICROSCOPIC - Abnormal; Notable for the following:    APPearance HAZY (*)    Leukocytes, UA TRACE (*)    All other components within normal limits  URINE MICROSCOPIC-ADD ON - Abnormal; Notable for the following:    Squamous Epithelial / LPF MANY (*)    Bacteria, UA FEW (*)    All other components within normal limits  POCT PREGNANCY, URINE - Abnormal; Notable for the following:    Preg Test, Ur POSITIVE (*)    All other components  within normal limits  WET PREP, GENITAL  GC/CHLAMYDIA PROBE AMP  CBC WITH DIFFERENTIAL   No results found.   No diagnosis found.    MDM  Pt comes in with lower quad abd pain and vaginal discharge.  She has positive u preg. Will get [place] to r/o ectopic.  She is having unprotected intercourse, and has hx of STD - so we will treat her for GC and Chlamydia as she is very high risk for STD.   Derwood Kaplan, MD 10/11/12 1324  Derwood Kaplan, MD 10/11/12 1524  3:29 PM Trich is positive as well. BHCG is pending. [place] is equivocal. We will d.c at this time, asked to see [facility] clinic for repeat BHCG.  Derwood Kaplan, MD 10/11/12 1530

## 2012-10-11 NOTE — ED Notes (Signed)
C/o intermittent lower abd pain, L>R & foul smelling vaginal discharge x 1 week. Describes pain as "crampy". Denies n/v, urinary frequency, dysuria. Presently denies any pain.

## 2012-10-11 NOTE — ED Notes (Signed)
Pt with lower abd cramping (like menstrual cramps) x 1 week with no vaginal bleeding and diarrhea x 3 days.  Pt does c/o "smelly" unusual vaginal discharge.  Denies nausea.  LMP Jan 18.

## 2012-10-11 NOTE — ED Notes (Signed)
To ultrasound

## 2012-10-11 NOTE — ED Notes (Signed)
Patient transported to Ultrasound 

## 2012-10-13 ENCOUNTER — Encounter (HOSPITAL_COMMUNITY): Payer: Self-pay

## 2012-10-13 ENCOUNTER — Inpatient Hospital Stay (HOSPITAL_COMMUNITY)
Admission: AD | Admit: 2012-10-13 | Discharge: 2012-10-13 | Disposition: A | Discharge status: Home / Self Care | Payer: Medicaid Other | Source: Ambulatory Visit | Attending: Obstetrics & Gynecology | Admitting: Obstetrics & Gynecology

## 2012-10-13 DIAGNOSIS — A5619 Other chlamydial genitourinary infection: Secondary | ICD-10-CM | POA: Insufficient documentation

## 2012-10-13 DIAGNOSIS — O98319 Other infections with a predominantly sexual mode of transmission complicating pregnancy, unspecified trimester: Secondary | ICD-10-CM | POA: Insufficient documentation

## 2012-10-13 DIAGNOSIS — N739 Female pelvic inflammatory disease, unspecified: Secondary | ICD-10-CM | POA: Insufficient documentation

## 2012-10-13 LAB — HCG, QUANTITATIVE, PREGNANCY: hCG, Beta Chain, Quant, S: 1627 m[IU]/mL — ABNORMAL HIGH (ref <5)

## 2012-10-13 MED ORDER — AZITHROMYCIN 250 MG PO TABS
1000.0000 mg | ORAL_TABLET | Freq: Once | ORAL | Status: DC
  Filled 2012-10-13: qty 4

## 2012-10-13 MED ORDER — METRONIDAZOLE 500 MG PO TABS
2000.0000 mg | ORAL_TABLET | Freq: Once | ORAL | Status: DC
  Filled 2012-10-13: qty 4

## 2012-10-13 NOTE — ED Notes (Signed)
+   Chlamydia Patient treated with Rocephin and Zithromax-[employer]HS letter faxed 

## 2012-10-13 NOTE — MAU Note (Signed)
Patient to MAU for repeat BHCG. Patient denies any pain or bleeding. Will be treated today for trich and a positive CT.

## 2012-10-13 NOTE — MAU Provider Note (Signed)
Attestation of Attending Supervision of Advanced Practitioner (PA/CNM/NP): Evaluation and management procedures were performed by the Advanced Practitioner under my supervision and collaboration.  I have reviewed the Advanced Practitioner's note and chart, and I agree with the management and plan.  Theone Bowell, MD, FACOG Attending Obstetrician & Gynecologist Faculty Practice, [facility]  

## 2012-10-13 NOTE — MAU Provider Note (Signed)
Marilyn Norman is a 22 y.o. G1P0 at [redacted]w[redacted]d by LMP here for repeat quant HCG. Seen at [facility] ED on 2/19 with abd pain and vaginal discharge. Quant was 733, no IUP or ectopic on u/s. Was treated for Trich with Flagyl at that time, was also given Rocephin and Azithromycin presumptively for GC/CT. Chlamydia has since come back positive, has not been sexually active since treatment and states she has informed partner he needs treatment.   Filed Vitals:   10/13/12 0946  BP: 143/71  Pulse: 100  Temp: 98.1 F (36.7 C)  Resp: 18   Gen: well, no distress  Results for orders placed during the hospital encounter of 10/13/12 (from the past 24 hour(s))  HCG, QUANTITATIVE, PREGNANCY     Status: Abnormal   Collection Time    10/13/12  9:38 AM      Result Value Range   hCG, Beta Chain, Quant, S 1627 (*) <5 mIU/mL    A/P: 22 y.o. G1P0 at [redacted]w[redacted]d  Follow up in 7 days for repeat u/s for viability Chlamydia and Trich already treated, rev'd partner treatment and no sex until 2 weeks s/p treatment

## 2012-10-14 ENCOUNTER — Telehealth (HOSPITAL_COMMUNITY): Payer: Self-pay | Admitting: Emergency Medicine

## 2012-10-14 NOTE — ED Notes (Signed)
Patient was informed of +Chlamydia when at [facility] on 2/21

## 2012-10-20 ENCOUNTER — Inpatient Hospital Stay (HOSPITAL_COMMUNITY)
Admission: AD | Admit: 2012-10-20 | Discharge: 2012-10-20 | Disposition: A | Discharge status: Home / Self Care | Payer: Medicaid Other | Source: Ambulatory Visit | Attending: Obstetrics and Gynecology | Admitting: Obstetrics and Gynecology

## 2012-10-20 ENCOUNTER — Ambulatory Visit (HOSPITAL_COMMUNITY)
Admit: 2012-10-20 | Discharge: 2012-10-20 | Disposition: A | Discharge status: Home / Self Care | Payer: Medicaid Other | Attending: Obstetrics & Gynecology | Admitting: Obstetrics & Gynecology

## 2012-10-20 ENCOUNTER — Ambulatory Visit (HOSPITAL_COMMUNITY): Payer: Medicaid Other

## 2012-10-20 DIAGNOSIS — Z3689 Encounter for other specified antenatal screening: Secondary | ICD-10-CM | POA: Insufficient documentation

## 2012-10-20 DIAGNOSIS — O3680X0 Pregnancy with inconclusive fetal viability, not applicable or unspecified: Secondary | ICD-10-CM | POA: Insufficient documentation

## 2012-10-20 NOTE — MAU Note (Signed)
[place] for viability. No complaints.

## 2012-10-20 NOTE — MAU Provider Note (Signed)
Marilyn Norman is a 22 y.o. G1P0 at [redacted]w[redacted]d here for repeat u/s for viability. No pain or bleeding today.     A/P: 22 y.o. G1P0 at [redacted]w[redacted]d with IUP Start prenatal care as soon as possible, precautions rev'd Pregnancy verification given  1. Normal IUP (intrauterine pregnancy) on prenatal ultrasound       Medication List    TAKE these medications       acetaminophen 500 MG tablet  Commonly known as:  TYLENOL  Take 1,000 mg by mouth every 6 (six) hours as needed for pain.     folic acid 1 MG tablet  Commonly known as:  FOLVITE  Take 1 tablet (1 mg total) by mouth daily.            Follow-up Information   Follow up with provider of your choice. (start prenatal care as soon as possible)

## 2012-10-21 ENCOUNTER — Encounter (HOSPITAL_COMMUNITY): Payer: Self-pay | Admitting: Advanced Practice Midwife

## 2012-10-25 NOTE — MAU Provider Note (Signed)
Attestation of Attending Supervision of Advanced Practitioner (CNM/NP): Evaluation and management procedures were performed by the Advanced Practitioner under my supervision and collaboration.  I have reviewed the Advanced Practitioner's note and chart, and I agree with the management and plan.  Dot Splinter 10/25/2012 9:11 AM   

## 2012-10-30 ENCOUNTER — Encounter (HOSPITAL_COMMUNITY): Payer: Self-pay

## 2012-10-30 ENCOUNTER — Inpatient Hospital Stay (HOSPITAL_COMMUNITY)
Admission: AD | Admit: 2012-10-30 | Discharge: 2012-10-31 | Disposition: A | Discharge status: Home / Self Care | Payer: Medicaid Other | Source: Ambulatory Visit | Attending: Obstetrics | Admitting: Obstetrics

## 2012-10-30 DIAGNOSIS — O021 Missed abortion: Secondary | ICD-10-CM | POA: Insufficient documentation

## 2012-10-30 DIAGNOSIS — O039 Complete or unspecified spontaneous abortion without complication: Secondary | ICD-10-CM

## 2012-10-30 LAB — URINALYSIS, ROUTINE W REFLEX MICROSCOPIC
Ketones, ur: NEGATIVE mg/dL
Leukocytes, UA: NEGATIVE
Nitrite: NEGATIVE
Protein, ur: NEGATIVE mg/dL

## 2012-10-30 NOTE — MAU Note (Signed)
PT states that she has been spotting since this morning. States that it is only when she wipes. Denies cramping.

## 2012-10-31 ENCOUNTER — Inpatient Hospital Stay (HOSPITAL_COMMUNITY): Payer: Medicaid Other

## 2012-10-31 NOTE — MAU Provider Note (Signed)
History     CSN: [id number]  Arrival date and time: 10/30/12 2254   None     Chief Complaint  Patient presents with  . Vaginal Bleeding   HPI Marilyn Norman is a 22 y.o. female @ [redacted]w[redacted]d gestation who presents to MAU with vaginal bleeding. The bleeding started today. She describes the bleeding as lighter than a period. She was evaluated here 2/28 and had an IUP but the heart rate was 85. She has an appointment with Dr. Gaynell Face tomorrow. The history was provided by the patient and her medical record.  OB History   Grav Para Term Preterm Abortions TAB SAB Ect Mult Living   3 2 2       2       Past Medical History  Diagnosis Date  . Hypertension   . Chest pain   . Cardiomegaly   . Costochondritis     Past Surgical History  Procedure Laterality Date  . Cesarean section      Family History  Problem Relation Age of Onset  . Asthma Mother   . Hearing loss Father     History  Substance Use Topics  . Smoking status: Former Games developer  . Smokeless tobacco: Not on file  . Alcohol Use: No    Allergies:  Allergies  Allergen Reactions  . Strawberry Itching and Rash    Prescriptions prior to admission  Medication Sig Dispense Refill  . acetaminophen (TYLENOL) 500 MG tablet Take 1,000 mg by mouth every 6 (six) hours as needed for pain.      . folic acid (FOLVITE) 1 MG tablet Take 1 tablet (1 mg total) by mouth daily.  100 tablet  0    Review of Systems  Constitutional: Negative for fever and chills.  Eyes: Negative for blurred vision and double vision.  Respiratory: Negative for cough and wheezing.   Cardiovascular: Negative for chest pain.  Gastrointestinal: Positive for nausea. Negative for abdominal pain.  Genitourinary:       Vaginal bleeding  Musculoskeletal: Negative for back pain.  Neurological: Negative for dizziness and headaches.  Psychiatric/Behavioral: Negative for depression. The patient is not nervous/anxious.    Physical Exam   Blood pressure  135/71, pulse 94, temperature 98.8 F (37.1 C), temperature source Oral, resp. rate 18, height 5\' 4"  (1.626 m), weight 218 lb 6.4 oz (99.066 kg), last menstrual period 09/09/2012, SpO2 100.00%.  Physical Exam  Nursing note and vitals reviewed. Constitutional: She is oriented to person, place, and time. She appears well-developed and well-nourished. No distress.  HENT:  Head: Normocephalic and atraumatic.  Eyes: EOM are normal.  Neck: Neck supple.  Cardiovascular: Normal rate.   Respiratory: Effort normal.  Musculoskeletal: Normal range of motion.  Neurological: She is alert and oriented to person, place, and time.  Skin: Skin is warm and dry.  Psychiatric: She has a normal mood and affect. Her behavior is normal. Judgment and thought content normal.   US Ob Transvaginal  10/31/2012  *RADIOLOGY REPORT*  Clinical Data: Bleeding, pregnant.  OBSTETRIC <14 WK ULTRASOUND  Technique:  Transabdominal ultrasound was performed for evaluation of the gestation as well as the maternal uterus and adnexal regions.  Comparison:  10/20/2012  Intrauterine gestational sac: Visualized/normal in shape. Yolk sac: Identified Embryo: Identified Cardiac Activity: Not identified  CRL:  8.5 mm  7 w  0 d  Maternal uterus/Adnexae: No subchorionic hemorrhage.  Normal sonographic appearance to the ovaries.  Corpus luteal cyst on the right.  No free fluid.  IMPRESSION: Intrauterine gestation without cardiac activity. At 8.5 mm, suspicious for nonviable/failed first trimester pregnancy. Recommend short-term follow-up.   Original Report Authenticated By: Jearld Lesch, M.D.     Blood type O positive  Procedures  Assessment: 22 y.o. female @ [redacted]w[redacted]d gestation with failed pregnancy  Plan:  Keep appointment with Dr. Gaynell Face tomorrow, return here as needed.   Instructions on miscarriage  NEESE,HOPE, RN, FNP, [facility] 10/31/2012, 12:45 AM

## 2013-01-18 ENCOUNTER — Encounter (HOSPITAL_COMMUNITY): Payer: Self-pay

## 2013-01-18 ENCOUNTER — Emergency Department (HOSPITAL_COMMUNITY)
Admission: EM | Admit: 2013-01-18 | Discharge: 2013-01-18 | Discharge status: Against Medical Advice | Payer: Medicaid Other | Attending: Emergency Medicine | Admitting: Emergency Medicine

## 2013-01-18 DIAGNOSIS — N949 Unspecified condition associated with female genital organs and menstrual cycle: Secondary | ICD-10-CM | POA: Insufficient documentation

## 2013-01-18 DIAGNOSIS — Z87891 Personal history of nicotine dependence: Secondary | ICD-10-CM | POA: Insufficient documentation

## 2013-01-18 DIAGNOSIS — Z3202 Encounter for pregnancy test, result negative: Secondary | ICD-10-CM | POA: Insufficient documentation

## 2013-01-18 DIAGNOSIS — I10 Essential (primary) hypertension: Secondary | ICD-10-CM | POA: Insufficient documentation

## 2013-01-18 NOTE — ED Notes (Signed)
Patient presents to ED c/o vaginal swelling and itching that started yesterday. Pt denies any new soaps or detergents. Pt states that she has burning with urination. Denies any vaginal discharge.

## 2013-01-18 NOTE — ED Notes (Signed)
NURSE FIRST ROUNDS : UNABLE TO LOCATE PT. AT TRIAGE AND WAIITNG AREA SEVERAL TIMES.

## 2013-01-19 ENCOUNTER — Encounter (HOSPITAL_COMMUNITY): Payer: Self-pay | Admitting: Emergency Medicine

## 2013-01-19 ENCOUNTER — Emergency Department (HOSPITAL_COMMUNITY)
Admission: EM | Admit: 2013-01-19 | Discharge: 2013-01-19 | Disposition: A | Discharge status: Home / Self Care | Payer: Medicaid Other | Attending: Emergency Medicine | Admitting: Emergency Medicine

## 2013-01-19 DIAGNOSIS — Z8739 Personal history of other diseases of the musculoskeletal system and connective tissue: Secondary | ICD-10-CM | POA: Insufficient documentation

## 2013-01-19 DIAGNOSIS — Z202 Contact with and (suspected) exposure to infections with a predominantly sexual mode of transmission: Secondary | ICD-10-CM | POA: Insufficient documentation

## 2013-01-19 DIAGNOSIS — N390 Urinary tract infection, site not specified: Secondary | ICD-10-CM | POA: Insufficient documentation

## 2013-01-19 DIAGNOSIS — A5901 Trichomonal vulvovaginitis: Secondary | ICD-10-CM | POA: Insufficient documentation

## 2013-01-19 DIAGNOSIS — B373 Candidiasis of vulva and vagina: Secondary | ICD-10-CM | POA: Insufficient documentation

## 2013-01-19 DIAGNOSIS — Z8679 Personal history of other diseases of the circulatory system: Secondary | ICD-10-CM | POA: Insufficient documentation

## 2013-01-19 DIAGNOSIS — L293 Anogenital pruritus, unspecified: Secondary | ICD-10-CM | POA: Insufficient documentation

## 2013-01-19 DIAGNOSIS — I10 Essential (primary) hypertension: Secondary | ICD-10-CM | POA: Insufficient documentation

## 2013-01-19 DIAGNOSIS — B3731 Acute candidiasis of vulva and vagina: Secondary | ICD-10-CM | POA: Insufficient documentation

## 2013-01-19 DIAGNOSIS — R011 Cardiac murmur, unspecified: Secondary | ICD-10-CM | POA: Insufficient documentation

## 2013-01-19 DIAGNOSIS — N898 Other specified noninflammatory disorders of vagina: Secondary | ICD-10-CM | POA: Insufficient documentation

## 2013-01-19 DIAGNOSIS — A64 Unspecified sexually transmitted disease: Secondary | ICD-10-CM

## 2013-01-19 DIAGNOSIS — Z87891 Personal history of nicotine dependence: Secondary | ICD-10-CM | POA: Insufficient documentation

## 2013-01-19 DIAGNOSIS — R300 Dysuria: Secondary | ICD-10-CM | POA: Insufficient documentation

## 2013-01-19 DIAGNOSIS — Z3202 Encounter for pregnancy test, result negative: Secondary | ICD-10-CM | POA: Insufficient documentation

## 2013-01-19 LAB — URINALYSIS, ROUTINE W REFLEX MICROSCOPIC
Glucose, UA: NEGATIVE mg/dL
Hgb urine dipstick: NEGATIVE
Specific Gravity, Urine: 1.019 (ref 1.005–1.030)
pH: 5.5 (ref 5.0–8.0)

## 2013-01-19 LAB — WET PREP, GENITAL: Clue Cells Wet Prep HPF POC: NONE SEEN

## 2013-01-19 LAB — PREGNANCY, URINE: Preg Test, Ur: NEGATIVE

## 2013-01-19 LAB — URINE MICROSCOPIC-ADD ON

## 2013-01-19 MED ORDER — LIDOCAINE HCL (PF) 1 % IJ SOLN
INTRAMUSCULAR | Status: AC
  Administered 2013-01-19: 0.9 mL
  Filled 2013-01-19: qty 5

## 2013-01-19 MED ORDER — FLUCONAZOLE 200 MG PO TABS: 200.0000 mg | ORAL_TABLET | Freq: Every day | ORAL | Status: AC

## 2013-01-19 MED ORDER — FLUCONAZOLE 100 MG PO TABS: 150.0000 mg | ORAL_TABLET | Freq: Once | ORAL | Status: DC

## 2013-01-19 MED ORDER — CEFTRIAXONE SODIUM 250 MG IJ SOLR
250.0000 mg | Freq: Once | INTRAMUSCULAR | Status: AC
  Administered 2013-01-19: 250 mg via INTRAMUSCULAR
  Filled 2013-01-19: qty 250

## 2013-01-19 MED ORDER — SULFAMETHOXAZOLE-TRIMETHOPRIM 800-160 MG PO TABS: 1.0000 | ORAL_TABLET | Freq: Two times a day (BID) | ORAL | Status: DC

## 2013-01-19 MED ORDER — METRONIDAZOLE 500 MG PO TABS
2000.0000 mg | ORAL_TABLET | Freq: Once | ORAL | Status: AC
  Administered 2013-01-19: 2000 mg via ORAL
  Filled 2013-01-19: qty 4

## 2013-01-19 MED ORDER — AZITHROMYCIN 250 MG PO TABS
1000.0000 mg | ORAL_TABLET | Freq: Once | ORAL | Status: AC
  Administered 2013-01-19: 1000 mg via ORAL
  Filled 2013-01-19: qty 4

## 2013-01-19 NOTE — ED Notes (Signed)
Vaginal pain and itching x 3 days hurts to void has a d/c

## 2013-01-19 NOTE — ED Provider Notes (Signed)
Medical screening examination/treatment/procedure(s) were performed by non-physician practitioner and as supervising physician I was immediately available for consultation/collaboration.   Joya Gaskins, MD 01/19/13 1012

## 2013-01-19 NOTE — ED Provider Notes (Signed)
History     CSN: [id number]  Arrival date & time 01/19/13  0828   First MD Initiated Contact with Patient 01/19/13 0831      Chief Complaint  Patient presents with  . Vaginal Pain    (Consider location/radiation/quality/duration/timing/severity/associated sxs/prior treatment) HPI  22 year old female who recently had a failed first trimester pregnancy last seen on transvaginal ultrasound performed on 10/31/12 presents c/o dysuria and vaginal itching. Patient reports for the past 3 days she has burning when urinating and also some itching sensation. Symptom has been persistent. She noticed some mild vaginal discharge without any significant odor. No specific treatment tried. No complaint of fever, chills, nausea, vomiting, diarrhea, abdominal pain, back pain, hematuria, hematochezia, or melena. Patient is a G4 P2. Patient has prior history of STDs including trichomonas and chlamydia as recent as February of this year. She is sexually active using protection.     Past Medical History  Diagnosis Date  . Hypertension   . Chest pain   . Cardiomegaly   . Costochondritis     Past Surgical History  Procedure Laterality Date  . Cesarean section      Family History  Problem Relation Age of Onset  . Asthma Mother   . Hearing loss Father     History  Substance Use Topics  . Smoking status: Former Games developer  . Smokeless tobacco: Not on file  . Alcohol Use: No    OB History   Grav Para Term Preterm Abortions TAB SAB Ect Mult Living   3 2 2       2       Review of Systems  All other systems reviewed and are negative.    Allergies  Strawberry  Home Medications   Current Outpatient Rx  Name  Route  Sig  Dispense  Refill  . acetaminophen (TYLENOL) 500 MG tablet   Oral   Take 1,000 mg by mouth every 4 (four) hours as needed for pain.          Marland Kitchen ibuprofen (ADVIL,MOTRIN) 200 MG tablet   Oral   Take 400 mg by mouth daily as needed for pain.           BP 169/81   Pulse 89  Resp 16  SpO2 99%  LMP 12/28/2012  Physical Exam  Nursing note and vitals reviewed. Constitutional: She appears well-developed and well-nourished. No distress.  HENT:  Head: Normocephalic and atraumatic.  Eyes: Conjunctivae are normal.  Neck: Normal range of motion. Neck supple.  Cardiovascular: Normal rate and regular rhythm.   Murmur (2 out 6 systolic murmur) heard. Pulmonary/Chest: Effort normal and breath sounds normal. She exhibits no tenderness.  Abdominal: Soft. There is no tenderness.  Genitourinary: Vagina normal and uterus normal. There is no rash or lesion on the right labia. There is no rash or lesion on the left labia. Cervix exhibits no motion tenderness and no discharge. Right adnexum displays no mass and no tenderness. Left adnexum displays no mass and no tenderness. No erythema, tenderness or bleeding around the vagina. No vaginal discharge found.  Chaperone present:  Curd-like discharge noted in vaginal vault.  No evidence suggestive of PID.    Lymphadenopathy:       Right: No inguinal adenopathy present.       Left: No inguinal adenopathy present.    ED Course  Procedures (including critical care time)  9:10 AM Dysuria and vaginal discharge, prior hx of STD.  Work up initiated.   9:46 AM Her UA  shows evidence of urinary tract infection. Patient will be treated with Bactrim. She also has evidence of yeast infection. She will be treated with Diflucan. She is positive for Trichomonas infection. She will receive 2 g of Flagyl here in the ED. Many  white blood cells on the wet prep. Will prophylactically treat for possible gonorrhea and Chlamydia infection with Rocephin/Zithromax. Patient were notified to avoid all sexual activities, notified her partner, and using protection each time she's having sexual activities. Patient voiced understanding and agrees with plan.  Labs Reviewed  WET PREP, GENITAL - Abnormal; Notable for the following:    Yeast Wet Prep  HPF POC FEW (*)    Trich, Wet Prep FEW (*)    WBC, Wet Prep HPF POC MANY (*)    All other components within normal limits  URINALYSIS, ROUTINE W REFLEX MICROSCOPIC - Abnormal; Notable for the following:    APPearance CLOUDY (*)    Leukocytes, UA LARGE (*)    All other components within normal limits  URINE MICROSCOPIC-ADD ON - Abnormal; Notable for the following:    Squamous Epithelial / LPF MANY (*)    Bacteria, UA FEW (*)    All other components within normal limits  GC/CHLAMYDIA PROBE AMP  URINE CULTURE  PREGNANCY, URINE   No results found.   1. STD (female)   2. Vaginal candidiasis   3. Trichomonal vaginitis   4. UTI (lower urinary tract infection)       MDM  BP 169/81  Pulse 89  Resp 16  SpO2 99%  LMP 12/28/2012  I have reviewed nursing notes and vital signs.  I reviewed available ER/hospitalization records thought the EMR         Fayrene Helper, [place] 01/19/13 7829

## 2013-01-20 LAB — URINE CULTURE

## 2013-05-10 ENCOUNTER — Other Ambulatory Visit: Payer: Self-pay | Admitting: Otolaryngology

## 2013-05-10 DIAGNOSIS — E049 Nontoxic goiter, unspecified: Secondary | ICD-10-CM

## 2013-05-10 DIAGNOSIS — R599 Enlarged lymph nodes, unspecified: Secondary | ICD-10-CM

## 2013-05-11 ENCOUNTER — Ambulatory Visit
Admission: RE | Admit: 2013-05-11 | Discharge: 2013-05-11 | Disposition: A | Discharge status: Home / Self Care | Payer: Medicaid Other | Source: Ambulatory Visit | Attending: Otolaryngology | Admitting: Otolaryngology

## 2013-05-11 DIAGNOSIS — R599 Enlarged lymph nodes, unspecified: Secondary | ICD-10-CM

## 2013-05-11 DIAGNOSIS — E049 Nontoxic goiter, unspecified: Secondary | ICD-10-CM

## 2013-05-14 ENCOUNTER — Telehealth: Payer: Self-pay | Admitting: Emergency Medicine

## 2013-05-14 NOTE — Telephone Encounter (Signed)
Called [place] w/ Dr Emeline Darling about [place] results from Friday.  According to report there is nothing to Bx.  Per Rad, to f/u ct neck/chest in future.

## 2013-05-17 ENCOUNTER — Ambulatory Visit: Payer: Medicaid Other | Admitting: Internal Medicine

## 2013-05-31 ENCOUNTER — Ambulatory Visit: Payer: Medicaid Other | Admitting: Internal Medicine

## 2013-06-21 ENCOUNTER — Emergency Department (HOSPITAL_COMMUNITY): Payer: Medicaid Other

## 2013-06-21 ENCOUNTER — Encounter (HOSPITAL_COMMUNITY): Payer: Self-pay | Admitting: Emergency Medicine

## 2013-06-21 ENCOUNTER — Emergency Department (HOSPITAL_COMMUNITY)
Admission: EM | Admit: 2013-06-21 | Discharge: 2013-06-21 | Disposition: A | Discharge status: Home / Self Care | Payer: Medicaid Other | Attending: Emergency Medicine | Admitting: Emergency Medicine

## 2013-06-21 DIAGNOSIS — N949 Unspecified condition associated with female genital organs and menstrual cycle: Secondary | ICD-10-CM | POA: Insufficient documentation

## 2013-06-21 DIAGNOSIS — O210 Mild hyperemesis gravidarum: Secondary | ICD-10-CM | POA: Insufficient documentation

## 2013-06-21 DIAGNOSIS — I10 Essential (primary) hypertension: Secondary | ICD-10-CM | POA: Insufficient documentation

## 2013-06-21 DIAGNOSIS — O9989 Other specified diseases and conditions complicating pregnancy, childbirth and the puerperium: Secondary | ICD-10-CM | POA: Insufficient documentation

## 2013-06-21 DIAGNOSIS — A599 Trichomoniasis, unspecified: Secondary | ICD-10-CM | POA: Insufficient documentation

## 2013-06-21 DIAGNOSIS — N898 Other specified noninflammatory disorders of vagina: Secondary | ICD-10-CM | POA: Insufficient documentation

## 2013-06-21 DIAGNOSIS — Z8639 Personal history of other endocrine, nutritional and metabolic disease: Secondary | ICD-10-CM | POA: Insufficient documentation

## 2013-06-21 DIAGNOSIS — Z862 Personal history of diseases of the blood and blood-forming organs and certain disorders involving the immune mechanism: Secondary | ICD-10-CM | POA: Insufficient documentation

## 2013-06-21 DIAGNOSIS — Z3490 Encounter for supervision of normal pregnancy, unspecified, unspecified trimester: Secondary | ICD-10-CM

## 2013-06-21 DIAGNOSIS — O98819 Other maternal infectious and parasitic diseases complicating pregnancy, unspecified trimester: Secondary | ICD-10-CM | POA: Insufficient documentation

## 2013-06-21 DIAGNOSIS — O169 Unspecified maternal hypertension, unspecified trimester: Secondary | ICD-10-CM | POA: Insufficient documentation

## 2013-06-21 DIAGNOSIS — Z87891 Personal history of nicotine dependence: Secondary | ICD-10-CM | POA: Insufficient documentation

## 2013-06-21 DIAGNOSIS — Z79899 Other long term (current) drug therapy: Secondary | ICD-10-CM | POA: Insufficient documentation

## 2013-06-21 HISTORY — DX: Disorder of thyroid, unspecified: E07.9

## 2013-06-21 LAB — CBC WITH DIFFERENTIAL/PLATELET
HCT: 31.2 % — ABNORMAL LOW (ref 36.0–46.0)
Hemoglobin: 10.8 g/dL — ABNORMAL LOW (ref 12.0–15.0)
Lymphocytes Relative: 38 % (ref 12–46)
MCHC: 34.6 g/dL (ref 30.0–36.0)
Monocytes Absolute: 0.8 10*3/uL (ref 0.1–1.0)
Monocytes Relative: 10 % (ref 3–12)
Neutro Abs: 3.9 10*3/uL (ref 1.7–7.7)
WBC: 7.7 10*3/uL (ref 4.0–10.5)

## 2013-06-21 LAB — URINALYSIS, ROUTINE W REFLEX MICROSCOPIC
Bilirubin Urine: NEGATIVE
Glucose, UA: NEGATIVE mg/dL
Hgb urine dipstick: NEGATIVE
Ketones, ur: NEGATIVE mg/dL
pH: 7 (ref 5.0–8.0)

## 2013-06-21 LAB — BASIC METABOLIC PANEL
BUN: 7 mg/dL (ref 6–23)
CO2: 24 mEq/L (ref 19–32)
Chloride: 102 mEq/L (ref 96–112)
Creatinine, Ser: 0.34 mg/dL — ABNORMAL LOW (ref 0.50–1.10)

## 2013-06-21 LAB — WET PREP, GENITAL: Clue Cells Wet Prep HPF POC: NONE SEEN

## 2013-06-21 MED ORDER — PRENATAL COMPLETE 14-0.4 MG PO TABS: 1.0000 | ORAL_TABLET | Freq: Every day | ORAL | Status: DC

## 2013-06-21 MED ORDER — METRONIDAZOLE 500 MG PO TABS
2000.0000 mg | ORAL_TABLET | Freq: Once | ORAL | Status: AC
  Administered 2013-06-21: 2000 mg via ORAL
  Filled 2013-06-21: qty 4

## 2013-06-21 NOTE — ED Notes (Signed)
Pt states for the past week she has been having throbbing lower pelvic pain.  Pt states she has not had a menstrual cycle since august but states this is not abnormal.  Pt states she is not using birth control, but is not sexually active.

## 2013-06-21 NOTE — ED Provider Notes (Signed)
CSN: [id number]     Arrival date & time 06/21/13  1526 History   First MD Initiated Contact with Patient 06/21/13 1933     Chief Complaint  Patient presents with  . Pelvic Pain  . Vaginal Discharge   (Consider location/radiation/quality/duration/timing/severity/associated sxs/prior Treatment) HPI Pt reports lower abdominal pain x 2 days, N/V x 4 days, thick white vaginal discharge, urinary frequency.  LMP at end of August.  She is G4P2 with 2 early spontaneous abortions and 2 live births.  OBGyn is Francoise Ceo.  Denies fevers, chills, body aches, diarrhea, change in stools, dysuria, vaginal bleeding.    Past Medical History  Diagnosis Date  . Hypertension   . Chest pain   . Cardiomegaly   . Costochondritis   . Thyroid disease     hyperthyroid   Past Surgical History  Procedure Laterality Date  . Cesarean section     Family History  Problem Relation Age of Onset  . Asthma Mother   . Hearing loss Father    History  Substance Use Topics  . Smoking status: Former Games developer  . Smokeless tobacco: Not on file  . Alcohol Use: No   OB History   Grav Para Term Preterm Abortions TAB SAB Ect Mult Living   3 2 2       2      Review of Systems  Constitutional: Negative for fever and chills.  Respiratory: Negative for cough and shortness of breath.   Cardiovascular: Negative for chest pain.  Gastrointestinal: Positive for nausea, vomiting and abdominal pain. Negative for diarrhea.  Genitourinary: Positive for frequency, vaginal discharge, menstrual problem and pelvic pain. Negative for dysuria, urgency and vaginal bleeding.  Musculoskeletal: Negative for myalgias.    Allergies  Strawberry  Home Medications   Current Outpatient Rx  Name  Route  Sig  Dispense  Refill  . acetaminophen (TYLENOL) 500 MG tablet   Oral   Take 1,000 mg by mouth every 4 (four) hours as needed for pain.          Marland Kitchen LISINOPRIL PO   Oral   Take 1 tablet by mouth daily.         . varenicline  (CHANTIX) 0.5 MG tablet   Oral   Take 0.5 mg by mouth 2 (two) times daily.          BP 131/78  Pulse 95  Temp(Src) 97.7 F (36.5 C) (Oral)  Resp 16  Ht 5\' 6"  (1.676 m)  Wt 214 lb (97.07 kg)  BMI 34.56 kg/m2  SpO2 98%  LMP 09/09/2012 Physical Exam  Nursing note and vitals reviewed. Constitutional: She appears well-developed and well-nourished. No distress.  HENT:  Head: Normocephalic and atraumatic.  Neck: Neck supple.  Cardiovascular: Normal rate and regular rhythm.   Pulmonary/Chest: Effort normal and breath sounds normal. No respiratory distress. She has no wheezes. She has no rales.  Abdominal: Soft. She exhibits no distension. There is tenderness. There is no rebound and no guarding.  Lower abdominal tenderness  Genitourinary: Uterus is not tender. Cervix exhibits no motion tenderness. Right adnexum displays no mass, no tenderness and no fullness. Left adnexum displays tenderness. Left adnexum displays no mass and no fullness. No bleeding around the vagina. Vaginal discharge found.  No blood within vaginal vault.  Small amount of white discharge.  Cervix is nontender.  Left adnexal tenderness without palpable mass.   Neurological: She is alert.  Skin: She is not diaphoretic.    ED Course  Procedures (including  critical care time) Labs Review Labs Reviewed  WET PREP, GENITAL - Abnormal; Notable for the following:    Trich, Wet Prep RARE (*)    WBC, Wet Prep HPF POC MODERATE (*)    All other components within normal limits  CBC WITH DIFFERENTIAL - Abnormal; Notable for the following:    Hemoglobin 10.8 (*)    HCT 31.2 (*)    All other components within normal limits  BASIC METABOLIC PANEL - Abnormal; Notable for the following:    Creatinine, Ser 0.34 (*)    All other components within normal limits  URINALYSIS, ROUTINE W REFLEX MICROSCOPIC - Abnormal; Notable for the following:    APPearance CLOUDY (*)    All other components within normal limits  HCG,  QUANTITATIVE, PREGNANCY - Abnormal; Notable for the following:    hCG, Beta Chain, Quant, S 62952 (*)    All other components within normal limits  POCT PREGNANCY, URINE - Abnormal; Notable for the following:    Preg Test, Ur POSITIVE (*)    All other components within normal limits  GC/CHLAMYDIA PROBE AMP   Imaging Review US Ob Comp Less 14 Wks  06/21/2013   CLINICAL DATA:  Pain, possible pregnancy  EXAM: OBSTETRIC <14 WK ULTRASOUND  TECHNIQUE: Transabdominal ultrasound was performed for evaluation of the gestation as well as the maternal uterus and adnexal regions.  COMPARISON:  Recent obstetrical ultrasound 10/31/2012  FINDINGS: Intrauterine gestational sac: Normal single intrauterine gestational sac.  Yolk sac:  Present  Embryo:  Present  Cardiac Activity: Yes  Heart Rate: 150 bpm  CRL:   17.6  mm   8 w to d                  [place] EDC: January 29, 2014  Maternal uterus/adnexae: Complex cyst affiliated with the right ovary likely reflects a corpus luteum. No subchorionic hemorrhage identified. The left ovary is unremarkable. No free fluid.  IMPRESSION: Single viable IUP.  Gestational age by crown-rump length is 8 weeks 2 days. The estimated date of confinement is 01/29/2014. Fetal heart rate 150 beats per min.   Electronically Signed   By: Malachy Moan M.D.   On: 06/21/2013 20:52    EKG Interpretation   None       MDM   1. Pregnant   2. Trichomonas     Pregnant patient with lower abdominal/pelvic pain, N/V, vaginal discharge. Found to be pregnant, IUP confirmed. Trichomonas on wet prep - pt has had this several times before and states she and her partner have both been treated previously.  Pt advised to have partner treated and to follow closely with her OB Dr Gaynell Face.  No e/o UTI.  Pt anemic but labwork otherwise unremarkable.  Flagyl PO given in ED per EMRA guidelines for pregnant patient.  Pt d/c home with OB follow up.  Discussed all results with patient.  Pt given return precautions.   Pt verbalizes understanding and agrees with plan.       Trixie Dredge, PA-C 06/21/13 2124

## 2013-06-21 NOTE — ED Notes (Addendum)
PT c/o emesis x 1 week and pelvic pain and discharge since yesterday.  PT is also worried she may be pregnant.

## 2013-06-22 LAB — GC/CHLAMYDIA PROBE AMP
CT Probe RNA: POSITIVE — AB
GC Probe RNA: NEGATIVE

## 2013-06-22 NOTE — ED Provider Notes (Signed)
Medical screening examination/treatment/procedure(s) were performed by non-physician practitioner and as supervising physician I was immediately available for consultation/collaboration.  Flint Melter, MD 06/22/13 [id number]

## 2013-06-23 ENCOUNTER — Telehealth (HOSPITAL_COMMUNITY): Payer: Self-pay | Admitting: Emergency Medicine

## 2013-06-23 NOTE — ED Notes (Signed)
Patient has +Chlamydia. 

## 2013-06-23 NOTE — ED Notes (Signed)
+  Chlamydia. Chart sent to EDP office for review. DHHS attached. 

## 2013-06-28 ENCOUNTER — Other Ambulatory Visit: Payer: Self-pay

## 2013-06-30 NOTE — ED Notes (Signed)
Chart returned from EDP office . Order written by Lemont Fillers for Azithromycin 1 gram po x 1 dose needs to be called to pharmacy Attempt made to contact patient-Left voice mail message.

## 2013-07-25 LAB — OB RESULTS CONSOLE HIV ANTIBODY (ROUTINE TESTING): HIV: NONREACTIVE

## 2013-07-25 LAB — OB RESULTS CONSOLE HEPATITIS B SURFACE ANTIGEN: Hepatitis B Surface Ag: NEGATIVE

## 2013-07-25 LAB — OB RESULTS CONSOLE RUBELLA ANTIBODY, IGM: Rubella: IMMUNE

## 2013-07-25 LAB — OB RESULTS CONSOLE RPR: RPR: NONREACTIVE

## 2013-07-31 ENCOUNTER — Encounter (HOSPITAL_COMMUNITY): Payer: Self-pay

## 2013-07-31 ENCOUNTER — Ambulatory Visit (HOSPITAL_COMMUNITY): Admission: RE | Admit: 2013-07-31 | Payer: Medicaid Other | Source: Ambulatory Visit

## 2013-07-31 ENCOUNTER — Inpatient Hospital Stay (HOSPITAL_COMMUNITY)
Admission: AD | Admit: 2013-07-31 | Discharge: 2013-08-02 | Disposition: A | Discharge status: Home / Self Care | Payer: Medicaid Other | Source: Ambulatory Visit | Attending: Obstetrics | Admitting: Obstetrics

## 2013-07-31 ENCOUNTER — Ambulatory Visit (HOSPITAL_COMMUNITY)
Admission: RE | Admit: 2013-07-31 | Discharge: 2013-07-31 | Disposition: A | Discharge status: Home / Self Care | Payer: Medicaid Other | Source: Ambulatory Visit | Attending: Obstetrics | Admitting: Obstetrics

## 2013-07-31 ENCOUNTER — Encounter (HOSPITAL_COMMUNITY): Payer: Self-pay | Admitting: *Deleted

## 2013-07-31 VITALS — BP 162/92 | HR 110 | Wt 196.0 lb

## 2013-07-31 DIAGNOSIS — E0590 Thyrotoxicosis, unspecified without thyrotoxic crisis or storm: Secondary | ICD-10-CM

## 2013-07-31 DIAGNOSIS — E079 Disorder of thyroid, unspecified: Secondary | ICD-10-CM | POA: Insufficient documentation

## 2013-07-31 DIAGNOSIS — I10 Essential (primary) hypertension: Secondary | ICD-10-CM

## 2013-07-31 DIAGNOSIS — O10019 Pre-existing essential hypertension complicating pregnancy, unspecified trimester: Secondary | ICD-10-CM | POA: Insufficient documentation

## 2013-07-31 HISTORY — DX: Thyrotoxicosis, unspecified without thyrotoxic crisis or storm: E05.90

## 2013-07-31 LAB — T3, FREE: T3, Free: >20 pg/mL — ABNORMAL HIGH (ref 2.3–4.2)

## 2013-07-31 LAB — CBC
MCH: 26.6 pg (ref 26.0–34.0)
MCV: 77.4 fL — ABNORMAL LOW (ref 78.0–100.0)
Platelets: 223 10*3/uL (ref 150–400)
RBC: 4.03 MIL/uL (ref 3.87–5.11)
RDW: 14 % (ref 11.5–15.5)

## 2013-07-31 LAB — T4, FREE: Free T4: 6.13 ng/dL — ABNORMAL HIGH (ref 0.80–1.80)

## 2013-07-31 MED ORDER — ZOLPIDEM TARTRATE 5 MG PO TABS: 5.0000 mg | ORAL_TABLET | Freq: Every evening | ORAL | Status: DC | PRN

## 2013-07-31 MED ORDER — ONDANSETRON 4 MG PO TBDP
4.0000 mg | ORAL_TABLET | ORAL | Status: DC | PRN
Start: 2013-07-31 — End: 2013-08-02
  Administered 2013-07-31: 4 mg via ORAL
  Filled 2013-07-31: qty 1

## 2013-07-31 MED ORDER — LABETALOL HCL 200 MG PO TABS
200.0000 mg | ORAL_TABLET | Freq: Three times a day (TID) | ORAL | Status: DC
  Administered 2013-07-31 – 2013-08-02 (×6): 200 mg via ORAL
  Filled 2013-07-31 (×7): qty 1

## 2013-07-31 NOTE — Progress Notes (Signed)
CSW consult noted & referred to RN case manager for assistance.  CSW signing off.

## 2013-07-31 NOTE — MAU Note (Signed)
Patient states she was seen in MFM this am and her blood pressure was elevated and was sent to MAU for evaluation. Patient denies any problems or symptoms of any kind.

## 2013-07-31 NOTE — H&P (Signed)
This is Dr. Francoise Ceo dictating the history and physical on  Marilyn Norman she's a 22 year old gravida 3 para 1011 EDC 02/18/2014 she's a 14 weeks and she's a chronic hypertensive she was seen by MFM today with a blood pressure 100 and a T2 and 90 she also has a history of hypothyroidism but she has not been seen by her endocrinologist since pregnancies [redacted] weeks pregnant stat she was taking lisinopril and last seen in this was changed to him labetalol 200 every 8 hours patient incision the monitor a prescription and had not been taking the medication his admitted for stabilization Past surgical history negative Past medical history history of chronic hypertensive also preeclampsia with her previous pregnancy Social history negative System review negative Physical exam well-developed female in no acute distress HEENT this is thyroid goiter Lungs clear to P&A heart regular rhythm no murmurs no gallops Breasts negative Abdomen negative Uterus 14 weeks size Present Cervix long close Pelvic extremities negative and

## 2013-07-31 NOTE — Progress Notes (Signed)
MATERNAL FETAL MEDICINE CONSULT  Patient Name: Marilyn Norman Medical Record Number:  [id number] Date of Birth: 11/11/1990 Requesting Physician Name:  Kathreen Cosier, MD Date of Service: 07/31/2013  Chief Complaint Hypertension and hyperthyroidism   History of Present Illness Marilyn Norman was seen today secondary to hypertension and hyperthyroidism at the request of Kathreen Cosier, MD.  The patient is a 22 y.o. G3P2002,at [redacted]w[redacted]d with an EDD of Estimated Date of Delivery: 01/29/14, by early ultrasound.  She has been hypertensive ever since her first delivery in 2008.  It has been effectively managed prior to her current pregnancy with lisinopril.  She was prescribed labetalol and told to stop lisinopril by Dr. Gaynell Face, but has not done so as she has been unable to afford to pay for the labetalol.  She has take lisinopril continuously throughout the whole pregnancy thus far.  She was also diagnosed with a goiter and hyperthyroidism in August of 2014.  She has not yet seen an Endocrinologist or started medication.  She reports a mild difficulty swallowing.  She denies any headaches, visual changes, RUQ, pain, swelling, tremor, heart palpitations, diarrhea, voice changes, or skin changes.  Review of Systems Pertinent items are noted in HPI.  Patient History OB History  Gravida Para Term Preterm AB SAB TAB Ectopic Multiple Living  3 2 2       2     # Outcome Date GA Lbr Len/2nd Weight Sex Delivery Anes PTL Lv  3 TRM     M LTCS   Y  2 TRM     F LTCS   Y  1 GRA              Comments: System Generated. Please review and update pregnancy details.      Past Medical History  Diagnosis Date  . Hypertension   . Chest pain   . Cardiomegaly   . Costochondritis   . Thyroid disease     hyperthyroid    Past Surgical History  Procedure Laterality Date  . Cesarean section      History   Social History  . Marital Status: Single    Spouse Name: N/A    Number of Children: N/A  .  Years of Education: N/A   Social History Main Topics  . Smoking status: Former Games developer  . Smokeless tobacco: Not on file  . Alcohol Use: No  . Drug Use: Yes    Special: Marijuana  . Sexual Activity: Yes    Birth Control/ Protection: Condom   Other Topics Concern  . Not on file   Social History Narrative  . No narrative on file    Family History  Problem Relation Age of Onset  . Asthma Mother   . Hearing loss Father    In addition, the patient has no family history of mental retardation, birth defects, or genetic diseases.  Physical Examination Vitals:  BP 179/91, Pulse 110, Weight 196 pounds General appearance - alert, well appearing, and in no distress Abdomen - soft, nontender, nondistended, no masses or organomegaly Extremities - no pedal edema noted  Assessment and Recommendations 1.  Hypertension.  Based on her blood pressure of 179/91 today it does not appear that her current dose of lisinopril is adequately controlling her blood pressure.  Furthermore, ACE inhibitors are associated with fetal renal dysplasia, oligohydramnios, and calvarial hypoplasia, so it should be discontinued immediately.  Labetalol is a good choice of antihypertensive medication.  Starting at a dose of 200  mg po bid is reasonable, but I expect it will need to be titrated upwards rapidly.  Dr. Gaynell Face has already obtained a baseline set of labs and 24 hour urine protein.  This should be repeated each trimester and as clinically indicated based on disease symptoms or the appearance of hypertension.  As chronic hypertension is associated with fetal growth restriction the patient should have serial growth scans every 4-6 weeks after her anatomic survey at approximately 18 weeks.  Once or twice weekly fetal surveillance should be started at 32 weeks of gestation. 2.  Hyperthyroidism.  I am uncertain how severe Ms. Klepper's hyperthyroidism is at this time, as I have no recent thyroid function tests to review.   However, she is demonstrating some signs of thyrotoxicosis such as a wide pulse pressure and tachycardia.  This needs to be evaluated immediately as I suspect it is significantly contributing to her hypertension.  She needs  TSH, free T4, free T3, and TSH receptor antibodies checked and should have an Endocrinology consult as soon as possible.  If hyperthyroidism is confirmed she should be started on methimazole 5 mg po daily, and free T4 should be monitored monthly.  The dose of methimazole should be titrated as needed to maintain the free T4 in the upper half of the normal range. 3.  Lisinopril exposure.  Unfortunately, Ms. Vanderhoof has been taking lisinopril during the most sensitive period pregnancy, and therefore is at risk of fetal renal dysplasia, oligohydramnios, and calvarial hypoplasia as indicated above.  Linsinopril should be stopped immediately, and labetalol started.  Once her medical issues have been stabilized she should have an ultrasound in the [facility] to assess for fetal effects of lisinopril. 4.  Disposition.  Given the complicated nature of Ms. Anguiano's medical conditions and her financial constraints, I think it is best to admit her to the hospital so these issues can be resolved as quickly as possible.  I have informed Dr. Gaynell Face of my recommendations, and he agrees.  Ms. Carbon needs to return home to drop off her son and will return for admission to the Antenatal units as soon as possible.  I spent 45 minutes with Ms. Ashley Royalty today of which 50% was face-to-face counseling.  Thank you for referring Ms. Speagle to the [facility].  Please do not hesitate to contact us with questions.   Rema Fendt, MD

## 2013-08-01 LAB — CREATININE CLEARANCE, URINE, 24 HOUR
Collection Interval-CRCL: 24 hours
Creatinine, 24H Ur: 1071 mg/d (ref 700–1800)
Creatinine, Urine: 97.38 mg/dL
Creatinine: 0.36 mg/dL — ABNORMAL LOW (ref 0.50–1.10)
Urine Total Volume-CRCL: 1100 mL

## 2013-08-01 LAB — CREATININE, SERUM
Creatinine, Ser: 0.36 mg/dL — ABNORMAL LOW (ref 0.50–1.10)
GFR calc Af Amer: >90 mL/min (ref >90)
GFR calc non Af Amer: >90 mL/min (ref >90)

## 2013-08-01 MED ORDER — METHIMAZOLE 5 MG PO TABS
5.0000 mg | ORAL_TABLET | Freq: Every day | ORAL | Status: DC
  Administered 2013-08-01: 10 mg via ORAL
  Filled 2013-08-01 (×2): qty 1

## 2013-08-01 NOTE — Progress Notes (Signed)
Case Manager went to see patient and discussed discharge plans.  Case Manager called Marilyn Norman - Financial Counselor and verified with her that patient does have active Medicaid and that her brand name prescriptions will be 3$ each. Relayed this information to the patient in patient's room.   Patient stated she did not get her medicines prior to this admission because she did not have the the money.  Patient stated that she has $3 for her B/P medicine and also she would like a prescription for nausea when she leaves.  She stated she has the money to pay for these prescriptions at discharge.  Reviewed with patient the importance of getting medicines filled at discharge.  Patient verbalized understanding and in agreement with plan.  No other needs identified at this time.

## 2013-08-01 NOTE — Progress Notes (Signed)
Patient ID: Marilyn Norman, female   DOB: Nov 02, 1990, 22 y.o.   MRN: [id number] Blood pressure 160 radiate there better than over yesterday she has no complaints TSH 1008 free T4-6 0.13 elevated free T3 greater than 20 will discuss with MFM today Patient says she is able to buy her medication on her own

## 2013-08-02 LAB — PROTEIN, URINE, 24 HOUR
Collection Interval-UPROT: 24 hours
Protein, 24H Urine: 77 mg/d (ref 50–100)
Protein, Urine: 7 mg/dL

## 2013-08-02 MED ORDER — METHIMAZOLE 5 MG PO TABS: 5.0000 mg | ORAL_TABLET | Freq: Every day | ORAL | Status: DC

## 2013-08-02 NOTE — Progress Notes (Signed)
Pt teaching complete  Ambulated out   

## 2013-08-02 NOTE — Discharge Summary (Signed)
  Patient is a 22 year old female with chronic hypertension and she was at home taking lisinopril that has been changed to Procardia 203 times a day she is also hyperthyroid and is in methimazole 5 mg by mouth daily she had significant decrease in her blood pressures she was admitted and   when she is home she is to see the endocrinologist and also MFM and then see me in 2 weeks

## 2013-08-03 NOTE — Care Management Note (Unsigned)
    Page 1 of 1   07/31/2013     4:12:44 PM   CARE MANAGEMENT NOTE 07/31/2013  Patient:  Marilyn Norman, Marilyn Norman   Account Number:  [id number]  Date Initiated:  07/31/2013  Documentation initiated by:  CRAFT,TERRI  Subjective/Objective Assessment:   22 year old female admitted 07/31/13 with hypertension at 14 weeks     Action/Plan:   D/C when medically  stable   Anticipated DC Date:  08/03/2013   Anticipated DC Plan:  HOME/SELF CARE  In-house referral  Clinical Social Worker      DC Planning Services  CM consult                Status of service:  In process, will continue to follow  Per UR Regulation:  Reviewed for med. necessity/level of care/duration of stay  Comments:  07/31/13,  Kathi Der RNC-MNN,BSN, [phone], CM received phone call from CSW concerning referral for medication needs.  Pt has Medicaid and is not eligible for medication assistance/  MATCH, CM explained to pt.  Pt states she does not have any needs at this time in whcih she needs assistance.  Instructed pt to let her nurse know if there are other questions/concerns.  Will follow.

## 2013-08-09 ENCOUNTER — Encounter (HOSPITAL_COMMUNITY): Payer: Self-pay | Admitting: *Deleted

## 2013-08-18 ENCOUNTER — Encounter (HOSPITAL_COMMUNITY): Payer: Self-pay | Admitting: *Deleted

## 2013-08-27 ENCOUNTER — Other Ambulatory Visit (HOSPITAL_COMMUNITY): Payer: Self-pay | Admitting: Obstetrics

## 2013-08-27 DIAGNOSIS — Z3689 Encounter for other specified antenatal screening: Secondary | ICD-10-CM

## 2013-08-27 DIAGNOSIS — O99282 Endocrine, nutritional and metabolic diseases complicating pregnancy, second trimester: Secondary | ICD-10-CM

## 2013-08-27 DIAGNOSIS — O162 Unspecified maternal hypertension, second trimester: Secondary | ICD-10-CM

## 2013-08-27 DIAGNOSIS — E0590 Thyrotoxicosis, unspecified without thyrotoxic crisis or storm: Secondary | ICD-10-CM

## 2013-09-05 ENCOUNTER — Encounter (HOSPITAL_COMMUNITY): Payer: Self-pay

## 2013-09-05 ENCOUNTER — Ambulatory Visit (HOSPITAL_COMMUNITY)
Admission: RE | Admit: 2013-09-05 | Discharge: 2013-09-05 | Disposition: A | Discharge status: Home / Self Care | Payer: Medicaid Other | Source: Ambulatory Visit | Attending: Obstetrics | Admitting: Obstetrics

## 2013-09-05 VITALS — BP 163/90 | HR 95 | Wt 201.0 lb

## 2013-09-05 DIAGNOSIS — O99280 Endocrine, nutritional and metabolic diseases complicating pregnancy, unspecified trimester: Secondary | ICD-10-CM

## 2013-09-05 DIAGNOSIS — O162 Unspecified maternal hypertension, second trimester: Secondary | ICD-10-CM

## 2013-09-05 DIAGNOSIS — Z3689 Encounter for other specified antenatal screening: Secondary | ICD-10-CM

## 2013-09-05 DIAGNOSIS — Z363 Encounter for antenatal screening for malformations: Secondary | ICD-10-CM | POA: Insufficient documentation

## 2013-09-05 DIAGNOSIS — Z1389 Encounter for screening for other disorder: Secondary | ICD-10-CM | POA: Insufficient documentation

## 2013-09-05 DIAGNOSIS — E079 Disorder of thyroid, unspecified: Secondary | ICD-10-CM | POA: Insufficient documentation

## 2013-09-05 DIAGNOSIS — O358XX0 Maternal care for other (suspected) fetal abnormality and damage, not applicable or unspecified: Secondary | ICD-10-CM | POA: Insufficient documentation

## 2013-09-05 DIAGNOSIS — O10019 Pre-existing essential hypertension complicating pregnancy, unspecified trimester: Secondary | ICD-10-CM | POA: Insufficient documentation

## 2013-09-05 DIAGNOSIS — E0590 Thyrotoxicosis, unspecified without thyrotoxic crisis or storm: Secondary | ICD-10-CM

## 2013-09-05 DIAGNOSIS — O09299 Supervision of pregnancy with other poor reproductive or obstetric history, unspecified trimester: Secondary | ICD-10-CM | POA: Insufficient documentation

## 2013-09-05 DIAGNOSIS — O99340 Other mental disorders complicating pregnancy, unspecified trimester: Secondary | ICD-10-CM | POA: Insufficient documentation

## 2013-09-05 DIAGNOSIS — F1910 Other psychoactive substance abuse, uncomplicated: Secondary | ICD-10-CM | POA: Insufficient documentation

## 2013-09-05 DIAGNOSIS — O99282 Endocrine, nutritional and metabolic diseases complicating pregnancy, second trimester: Secondary | ICD-10-CM

## 2013-09-05 NOTE — Consult Note (Signed)
Maternal Fetal Medicine Consultation  Requesting Provider(s): Francoise Ceo, MD  Reason for consultation: CHTN previously on Lisinopril, hyperthyroidism  HPI: Marilyn Norman is a 23 yo [id number], EDD 01/29/2014 currently at 19 1/7 weeks who returns for follow up (see previous consult from Dr.Nitsche).  The patient reports a history of chronic hypertension, first diagnosed in 2008 following the delivery of her first child.  The patient on Lisinopril until [redacted] weeks gestation- recently transitioned to Labetalol 200 mg TID.  Additionally, the patient has a history of hyperthyroidism.  Her most recent TSH (12/9) was < 0.008 and FT4 of 6/13.  She is currently on Methimazole and was recently seen by Endocrinology.  The patient was recently hospitalized - had baseline preeclampsia labs that were normal.  Her 24-hr urine showed 77 mg protein/24 hrs and CrCl was 207 ml/min.  The patient's past OB history is remarkable for elevated blood pressures / preeclampsia.  She underwent 2 previous term C-sections without complications.  She is without complaints today.  OB History: OB History   Grav Para Term Preterm Abortions TAB SAB Ect Mult Living   5 2 2  2  2   2       PMH:  Past Medical History  Diagnosis Date  . Hypertension   . Chest pain   . Cardiomegaly   . Costochondritis   . Thyroid disease     hyperthyroid  . Hyperthyroidism     PSH:  Past Surgical History  Procedure Laterality Date  . Cesarean section     Meds: Current outpatient prescriptions:LABETALOL HCL PO, Take by mouth 3 (three) times daily., Disp: , Rfl: ;  methimazole (TAPAZOLE) 5 MG tablet, Take 1 tablet (5 mg total) by mouth daily., Disp: 60 tablet, Rfl: 3;  Prenatal Vit-Fe Fumarate-FA (PRENATAL COMPLETE) 14-0.4 MG TABS, Take 1 tablet by mouth daily., Disp: 60 each, Rfl: 5  Allergies:  Allergies  Allergen Reactions  . Strawberry Itching and Rash   FH:  Family History  Problem Relation Age of Onset  . Asthma Mother   .  Hearing loss Father   Father - heart problems - age 68. Mother, Grandmother - Cancer (unknown type), hypertension.  Denies birth defects or hereditary disorders.  Soc:  Smokes < 1 ppd, denies ETOH use, some Mariajuana use but denies other illicit drug use  Review of Systems: no vaginal bleeding or cramping/contractions, no LOF, no nausea/vomiting. All other systems reviewed and are negative.  PE:   Filed Vitals:   09/05/13 1320  BP: 163/90  Pulse: 95    GEN: well-appearing female ABD: gravid, NT  Ultrasound: Single IUP at 19 1/7 weeks Hx of CHTN, preeclampsia, hyperthyroidism on Methimazole Normal anatomic fetal survey Limited views of the fetal heart anatomy, diaphragm obtained due to poor ultrasound penetration No markers associated with aneuploidy noted Normal amniotic fluid volume  Labs: CBC    Component Value Date/Time   WBC 8.0 07/31/2013 1140   RBC 4.03 07/31/2013 1140   HGB 10.7* 07/31/2013 1140   HCT 31.2* 07/31/2013 1140   PLT 223 07/31/2013 1140   MCV 77.4* 07/31/2013 1140   MCH 26.6 07/31/2013 1140   MCHC 34.3 07/31/2013 1140   RDW 14.0 07/31/2013 1140   LYMPHSABS 2.9 06/21/2013 1617   MONOABS 0.8 06/21/2013 1617   EOSABS 0.2 06/21/2013 1617   BASOSABS 0.0 06/21/2013 1617   CMP     Component Value Date/Time   NA 136 06/21/2013 1617   K 4.5 06/21/2013 1617   CL  102 06/21/2013 1617   CO2 24 06/21/2013 1617   GLUCOSE 88 06/21/2013 1617   BUN 7 06/21/2013 1617   CREATININE 0.36* 08/01/2013 1607   CREATININE 0.36* 07/31/2013 1339   CALCIUM 9.8 06/21/2013 1617   PROT 7.0 10/11/2012 1110   ALBUMIN 3.2* 10/11/2012 1110   AST 28 10/11/2012 1110   ALT 50* 10/11/2012 1110   ALKPHOS 97 10/11/2012 1110   BILITOT 0.4 10/11/2012 1110   GFRNONAA >90 08/01/2013 1607   GFRAA >90 08/01/2013 1607     A/P: 1) Single IUP at 19 1/7 weeks         2) Lisinopril exposure - Lisinopril use during the 2nd and 3rd trimester of pregnancy is associated with fetal renal dysplasia,  oligohydramnios, and calvarial hypoplasia.  Somewhat limited views of the fetal anatomy were obtained today, but no obvious renal abnormalities were appreciated.  Recommend follow up ultrasound in 4-6 weeks to reevaluate the fetal anatomy.         3) Chronic hypertension - The patient is currently on Labetalol 200 mg TID - continues to have some elevated blood pressures.  Would recommend increasing Labetalol to 300 mg TID and continue to titrate the dose as needed to maintain BP's in the 140/90 range. (maximum dose 800 mg TID).  If a second agent is required, would recommend Procardia XL.  Based on this history, would recommend serial growth scans every 4 weeks during the third trimester and antepartum fetal testing beginning at [redacted] weeks gestation.         4) Hyperthyroidism - currently on Methimzole.  Recommend monthly TSH and free T4 labs - continued close follow up with Endocrinology.  Would adjust dose of Methimazole to achieve a T4 value in the high-normal range.   Thank you for the opportunity to be a part of the care of Marilyn Norman. Please contact our office if we can be of further assistance.   I spent approximately 30 minutes with this patient with over 50% of time spent in face-to-face counseling.  Alpha GulaPaul Bhakti Labella, MD Maternal-Fetal Medicine

## 2013-10-17 ENCOUNTER — Ambulatory Visit (HOSPITAL_COMMUNITY)
Admission: RE | Admit: 2013-10-17 | Discharge: 2013-10-17 | Disposition: A | Discharge status: Home / Self Care | Payer: Medicaid Other | Source: Ambulatory Visit | Attending: Family Medicine | Admitting: Family Medicine

## 2013-10-17 ENCOUNTER — Other Ambulatory Visit (HOSPITAL_COMMUNITY): Payer: Self-pay | Admitting: Obstetrics

## 2013-10-17 DIAGNOSIS — E079 Disorder of thyroid, unspecified: Secondary | ICD-10-CM | POA: Insufficient documentation

## 2013-10-17 DIAGNOSIS — O99280 Endocrine, nutritional and metabolic diseases complicating pregnancy, unspecified trimester: Secondary | ICD-10-CM

## 2013-10-17 DIAGNOSIS — E0590 Thyrotoxicosis, unspecified without thyrotoxic crisis or storm: Secondary | ICD-10-CM

## 2013-10-17 DIAGNOSIS — O162 Unspecified maternal hypertension, second trimester: Secondary | ICD-10-CM

## 2013-10-17 DIAGNOSIS — O09299 Supervision of pregnancy with other poor reproductive or obstetric history, unspecified trimester: Secondary | ICD-10-CM

## 2013-10-17 DIAGNOSIS — F1910 Other psychoactive substance abuse, uncomplicated: Secondary | ICD-10-CM | POA: Insufficient documentation

## 2013-10-17 DIAGNOSIS — O34219 Maternal care for unspecified type scar from previous cesarean delivery: Secondary | ICD-10-CM

## 2013-10-17 DIAGNOSIS — O10019 Pre-existing essential hypertension complicating pregnancy, unspecified trimester: Secondary | ICD-10-CM | POA: Insufficient documentation

## 2013-10-17 DIAGNOSIS — IMO0002 Reserved for concepts with insufficient information to code with codable children: Secondary | ICD-10-CM

## 2013-10-17 DIAGNOSIS — O99340 Other mental disorders complicating pregnancy, unspecified trimester: Secondary | ICD-10-CM | POA: Insufficient documentation

## 2013-10-17 DIAGNOSIS — Z3689 Encounter for other specified antenatal screening: Secondary | ICD-10-CM | POA: Insufficient documentation

## 2013-11-28 ENCOUNTER — Ambulatory Visit (HOSPITAL_COMMUNITY): Payer: Medicaid Other

## 2013-12-01 ENCOUNTER — Inpatient Hospital Stay (HOSPITAL_COMMUNITY)
Admission: AD | Admit: 2013-12-01 | Discharge: 2013-12-01 | Disposition: A | Discharge status: Home / Self Care | Payer: Medicaid Other | Source: Ambulatory Visit | Attending: Obstetrics | Admitting: Obstetrics

## 2013-12-01 ENCOUNTER — Encounter (HOSPITAL_COMMUNITY): Payer: Self-pay | Admitting: *Deleted

## 2013-12-01 DIAGNOSIS — O4700 False labor before 37 completed weeks of gestation, unspecified trimester: Secondary | ICD-10-CM | POA: Insufficient documentation

## 2013-12-01 DIAGNOSIS — R109 Unspecified abdominal pain: Secondary | ICD-10-CM | POA: Insufficient documentation

## 2013-12-01 DIAGNOSIS — O10019 Pre-existing essential hypertension complicating pregnancy, unspecified trimester: Secondary | ICD-10-CM | POA: Insufficient documentation

## 2013-12-01 DIAGNOSIS — N949 Unspecified condition associated with female genital organs and menstrual cycle: Secondary | ICD-10-CM

## 2013-12-01 DIAGNOSIS — Z87891 Personal history of nicotine dependence: Secondary | ICD-10-CM | POA: Insufficient documentation

## 2013-12-01 HISTORY — DX: Chlamydial infection, unspecified: A74.9

## 2013-12-01 HISTORY — DX: Trichomoniasis, unspecified: A59.9

## 2013-12-01 LAB — OB RESULTS CONSOLE GC/CHLAMYDIA
CHLAMYDIA, DNA PROBE: NEGATIVE
Gonorrhea: NEGATIVE

## 2013-12-01 LAB — WET PREP, GENITAL
Clue Cells Wet Prep HPF POC: NONE SEEN
TRICH WET PREP: NONE SEEN

## 2013-12-01 LAB — FETAL FIBRONECTIN: FETAL FIBRONECTIN: POSITIVE — AB

## 2013-12-01 LAB — URINALYSIS, ROUTINE W REFLEX MICROSCOPIC
BILIRUBIN URINE: NEGATIVE
Glucose, UA: NEGATIVE mg/dL
HGB URINE DIPSTICK: NEGATIVE
KETONES UR: NEGATIVE mg/dL
Leukocytes, UA: NEGATIVE
Nitrite: NEGATIVE
Protein, ur: NEGATIVE mg/dL
Specific Gravity, Urine: 1.01 (ref 1.005–1.030)
UROBILINOGEN UA: 0.2 mg/dL (ref 0.0–1.0)
pH: 6 (ref 5.0–8.0)

## 2013-12-01 NOTE — Discharge Instructions (Signed)
Abdominal Pain During Pregnancy °Abdominal pain is common in pregnancy. Most of the time, it does not cause harm. There are many causes of abdominal pain. Some causes are more serious than others. Some of the causes of abdominal pain in pregnancy are easily diagnosed. Occasionally, the diagnosis takes time to understand. Other times, the cause is not determined. Abdominal pain can be a sign that something is very wrong with the pregnancy, or the pain may have nothing to do with the pregnancy at all. For this reason, always tell your health care provider if you have any abdominal discomfort. °HOME CARE INSTRUCTIONS  °Monitor your abdominal pain for any changes. The following actions may help to alleviate any discomfort you are experiencing: °· Do not have sexual intercourse or put anything in your vagina until your symptoms go away completely. °· Get plenty of rest until your pain improves. °· Drink clear fluids if you feel nauseous. Avoid solid food as long as you are uncomfortable or nauseous. °· Only take over-the-counter or prescription medicine as directed by your health care provider. °· Keep all follow-up appointments with your health care provider. °SEEK IMMEDIATE MEDICAL CARE IF: °· You are bleeding, leaking fluid, or passing tissue from the vagina. °· You have increasing pain or cramping. °· You have persistent vomiting. °· You have painful or bloody urination. °· You have a fever. °· You notice a decrease in your baby's movements. °· You have extreme weakness or feel faint. °· You have shortness of breath, with or without abdominal pain. °· You develop a severe headache with abdominal pain. °· You have abnormal vaginal discharge with abdominal pain. °· You have persistent diarrhea. °· You have abdominal pain that continues even after rest, or gets worse. °MAKE SURE YOU:  °· Understand these instructions. °· Will watch your condition. °· Will get help right away if you are not doing well or get  worse. °Document Released: 08/09/2005 Document Revised: 05/30/2013 Document Reviewed: 03/08/2013 °ExitCare® Patient Information ©2014 [employer]. ° °

## 2013-12-01 NOTE — MAU Note (Signed)
Began having pain and bleeding last night. Notices blood when she wipes. Denies recent intercourse. States she has had problems with her thyroid and her BP has been elevated.

## 2013-12-01 NOTE — MAU Provider Note (Signed)
None     Chief Complaint:  Abdominal Pain   Hamilton Capribril L Beidleman is  23 y.o. [id number] at 4332w4d presents complaining of Abdominal Pain Along her sides in lower pelvic region since yesterday. She also noticed some spotting only when she wipes since then, as well.  Denies recent intercourse, pelvic exam, vaginal discharge or irritation.  She has CHTN, states she took her Labetalol 200mg  TID this am, but is due for afternoon dose  Obstetrical/Gynecological History: OB History   Grav Para Term Preterm Abortions TAB SAB Ect Mult Living   5 2 2  2  2   2      Past Medical History: Past Medical History  Diagnosis Date  . Hypertension   . Chest pain   . Cardiomegaly   . Costochondritis   . Thyroid disease     hyperthyroid  . Hyperthyroidism   . Chlamydia   . Trichomonas infection     Past Surgical History: Past Surgical History  Procedure Laterality Date  . Cesarean section      Family History: Family History  Problem Relation Age of Onset  . Asthma Mother   . Hearing loss Father     Social History: History  Substance Use Topics  . Smoking status: Former Games developermoker  . Smokeless tobacco: Not on file  . Alcohol Use: No    Allergies:  Allergies  Allergen Reactions  . Strawberry Itching, Rash, Hives and Swelling    Meds:  Prescriptions prior to admission  Medication Sig Dispense Refill  . acetaminophen (TYLENOL) 500 MG tablet Take 1,000 mg by mouth every 6 (six) hours as needed for moderate pain.      Marland Kitchen. labetalol (NORMODYNE) 200 MG tablet Take 200 mg by mouth 3 (three) times daily.      . methimazole (TAPAZOLE) 5 MG tablet Take 20 mg by mouth daily. Take 4 tablets once daily      . Prenatal Vit-Fe Fumarate-FA (PRENATAL VITAMINS PLUS) 27-1 MG TABS Take 1 tablet by mouth.        Review of Systems   Constitutional: Negative for fever and chills Eyes: Negative for visual disturbances Respiratory: Negative for shortness of breath, dyspnea Cardiovascular: Negative for chest  pain or palpitations  Gastrointestinal: Negative for vomiting, diarrhea and constipation Genitourinary: Negative for dysuria and urgency Musculoskeletal: Negative for back pain, joint pain, myalgias  Neurological: Negative for dizziness and headaches    Physical Exam  Blood pressure 155/87, pulse 101, temperature 98.5 F (36.9 C), temperature source Oral, resp. rate 18, height 5' 4.5" (1.638 m), weight 94.348 kg (208 lb), unknown if currently breastfeeding. GENERAL: Well-developed, well-nourished female in no acute distress.  LUNGS: Clear to auscultation bilaterally.  HEART: Regular rate and rhythm. ABDOMEN: Soft, nontender, nondistended, gravid.  EXTREMITIES: Nontender, no edema, 2+ distal pulses. DTR's 2+ PELVIC:  SSE: scant pink tinged discharge without odor; Wet prep normal; cx non friable,  fFn collected  CERVICAL EXAM: Dilatation 0cm   Effacement 0%   Station nothing in pelvis   Presentation: unsure FHT:  Baseline rate 150 bpm   Variability moderate  Accelerations present   Decelerations none Contractions: infrequent and irregular, not felt by pt, q 6-18 mins   Labs: Results for orders placed during the hospital encounter of 12/01/13 (from the past 24 hour(s))  URINALYSIS, ROUTINE W REFLEX MICROSCOPIC   Collection Time    12/01/13  2:50 PM      Result Value Ref Range   Color, Urine YELLOW  YELLOW  APPearance CLEAR  CLEAR   Specific Gravity, Urine 1.010  1.005 - 1.030   pH 6.0  5.0 - 8.0   Glucose, UA NEGATIVE  NEGATIVE mg/dL   Hgb urine dipstick NEGATIVE  NEGATIVE   Bilirubin Urine NEGATIVE  NEGATIVE   Ketones, ur NEGATIVE  NEGATIVE mg/dL   Protein, ur NEGATIVE  NEGATIVE mg/dL   Urobilinogen, UA 0.2  0.0 - 1.0 mg/dL   Nitrite NEGATIVE  NEGATIVE   Leukocytes, UA NEGATIVE  NEGATIVE  WET PREP, GENITAL   Collection Time    12/01/13  3:06 PM      Result Value Ref Range   Yeast Wet Prep HPF POC RARE (*) NONE SEEN   Trich, Wet Prep NONE SEEN  NONE SEEN   Clue Cells Wet  Prep HPF POC NONE SEEN  NONE SEEN   WBC, Wet Prep HPF POC MODERATE (*) NONE SEEN  FETAL FIBRONECTIN   Collection Time    12/01/13  3:06 PM      Result Value Ref Range   Fetal Fibronectin POSITIVE (*) NEGATIVE   Imaging Studies:  No results found.  Assessment: RHIANON SLIGER is  23 y.o. [id number] at [redacted]w[redacted]d presents with most likely ligament pain;  + fetal fibronectin CHTN.  Plan: Discussed with Dr. Clearance Coots. In lieu of long closed cx and rare contractions, will not treat + ffN; f/u with Dr. Gaynell Face on Monday, or with MAU if new sx develop  Jacklyn Shell 4/11/20154:23 PM

## 2013-12-03 LAB — GC/CHLAMYDIA PROBE AMP
CT Probe RNA: NEGATIVE
GC Probe RNA: NEGATIVE

## 2013-12-05 ENCOUNTER — Ambulatory Visit (HOSPITAL_COMMUNITY)
Admission: RE | Admit: 2013-12-05 | Discharge: 2013-12-05 | Disposition: A | Discharge status: Home / Self Care | Payer: Medicaid Other | Source: Ambulatory Visit | Attending: Obstetrics | Admitting: Obstetrics

## 2013-12-05 ENCOUNTER — Other Ambulatory Visit (HOSPITAL_COMMUNITY): Payer: Self-pay | Admitting: Obstetrics

## 2013-12-05 DIAGNOSIS — O10019 Pre-existing essential hypertension complicating pregnancy, unspecified trimester: Secondary | ICD-10-CM

## 2013-12-05 DIAGNOSIS — O09299 Supervision of pregnancy with other poor reproductive or obstetric history, unspecified trimester: Secondary | ICD-10-CM

## 2013-12-05 DIAGNOSIS — E079 Disorder of thyroid, unspecified: Secondary | ICD-10-CM | POA: Insufficient documentation

## 2013-12-05 DIAGNOSIS — IMO0002 Reserved for concepts with insufficient information to code with codable children: Secondary | ICD-10-CM

## 2013-12-05 DIAGNOSIS — F1910 Other psychoactive substance abuse, uncomplicated: Secondary | ICD-10-CM | POA: Insufficient documentation

## 2013-12-05 DIAGNOSIS — O139 Gestational [pregnancy-induced] hypertension without significant proteinuria, unspecified trimester: Secondary | ICD-10-CM

## 2013-12-05 DIAGNOSIS — O34219 Maternal care for unspecified type scar from previous cesarean delivery: Secondary | ICD-10-CM

## 2013-12-05 DIAGNOSIS — O09899 Supervision of other high risk pregnancies, unspecified trimester: Secondary | ICD-10-CM | POA: Insufficient documentation

## 2013-12-05 DIAGNOSIS — O99280 Endocrine, nutritional and metabolic diseases complicating pregnancy, unspecified trimester: Secondary | ICD-10-CM

## 2013-12-05 DIAGNOSIS — O99340 Other mental disorders complicating pregnancy, unspecified trimester: Secondary | ICD-10-CM | POA: Insufficient documentation

## 2013-12-12 ENCOUNTER — Other Ambulatory Visit (HOSPITAL_COMMUNITY): Payer: Self-pay | Admitting: Obstetrics

## 2013-12-12 ENCOUNTER — Encounter (HOSPITAL_COMMUNITY): Payer: Self-pay | Admitting: *Deleted

## 2013-12-12 ENCOUNTER — Ambulatory Visit (HOSPITAL_COMMUNITY)
Admission: RE | Admit: 2013-12-12 | Discharge: 2013-12-12 | Disposition: A | Discharge status: Home / Self Care | Payer: Medicaid Other | Source: Ambulatory Visit | Attending: Obstetrics | Admitting: Obstetrics

## 2013-12-12 ENCOUNTER — Inpatient Hospital Stay (HOSPITAL_COMMUNITY)
Admission: AD | Admit: 2013-12-12 | Discharge: 2013-12-17 | DRG: 781 | Disposition: A | Discharge status: Home / Self Care | Payer: Medicaid Other | Source: Ambulatory Visit | Attending: Obstetrics | Admitting: Obstetrics

## 2013-12-12 DIAGNOSIS — O09299 Supervision of pregnancy with other poor reproductive or obstetric history, unspecified trimester: Secondary | ICD-10-CM

## 2013-12-12 DIAGNOSIS — E079 Disorder of thyroid, unspecified: Secondary | ICD-10-CM | POA: Diagnosis present

## 2013-12-12 DIAGNOSIS — IMO0002 Reserved for concepts with insufficient information to code with codable children: Secondary | ICD-10-CM

## 2013-12-12 DIAGNOSIS — O139 Gestational [pregnancy-induced] hypertension without significant proteinuria, unspecified trimester: Secondary | ICD-10-CM

## 2013-12-12 DIAGNOSIS — O34219 Maternal care for unspecified type scar from previous cesarean delivery: Secondary | ICD-10-CM

## 2013-12-12 DIAGNOSIS — Z87891 Personal history of nicotine dependence: Secondary | ICD-10-CM

## 2013-12-12 DIAGNOSIS — R030 Elevated blood-pressure reading, without diagnosis of hypertension: Secondary | ICD-10-CM

## 2013-12-12 DIAGNOSIS — O99340 Other mental disorders complicating pregnancy, unspecified trimester: Secondary | ICD-10-CM

## 2013-12-12 DIAGNOSIS — IMO0001 Reserved for inherently not codable concepts without codable children: Secondary | ICD-10-CM | POA: Diagnosis present

## 2013-12-12 DIAGNOSIS — O99280 Endocrine, nutritional and metabolic diseases complicating pregnancy, unspecified trimester: Secondary | ICD-10-CM

## 2013-12-12 DIAGNOSIS — O10019 Pre-existing essential hypertension complicating pregnancy, unspecified trimester: Principal | ICD-10-CM | POA: Diagnosis present

## 2013-12-12 DIAGNOSIS — E0590 Thyrotoxicosis, unspecified without thyrotoxic crisis or storm: Secondary | ICD-10-CM | POA: Diagnosis present

## 2013-12-12 LAB — COMPREHENSIVE METABOLIC PANEL
ALBUMIN: 2.5 g/dL — AB (ref 3.5–5.2)
ALT: 16 U/L (ref 0–35)
AST: 17 U/L (ref 0–37)
Alkaline Phosphatase: 266 U/L — ABNORMAL HIGH (ref 39–117)
BUN: 7 mg/dL (ref 6–23)
CO2: 21 mEq/L (ref 19–32)
Calcium: 8.4 mg/dL (ref 8.4–10.5)
Chloride: 103 mEq/L (ref 96–112)
Creatinine, Ser: 0.42 mg/dL — ABNORMAL LOW (ref 0.50–1.10)
GFR calc non Af Amer: >90 mL/min (ref >90)
GLUCOSE: 85 mg/dL (ref 70–99)
Potassium: 4.1 mEq/L (ref 3.7–5.3)
Sodium: 138 mEq/L (ref 137–147)
Total Bilirubin: 0.5 mg/dL (ref 0.3–1.2)
Total Protein: 6.7 g/dL (ref 6.0–8.3)

## 2013-12-12 LAB — CBC
HEMATOCRIT: 32.5 % — AB (ref 36.0–46.0)
HEMOGLOBIN: 10.6 g/dL — AB (ref 12.0–15.0)
MCH: 24.4 pg — AB (ref 26.0–34.0)
MCHC: 32.6 g/dL (ref 30.0–36.0)
MCV: 74.9 fL — AB (ref 78.0–100.0)
Platelets: 265 10*3/uL (ref 150–400)
RBC: 4.34 MIL/uL (ref 3.87–5.11)
RDW: 15 % (ref 11.5–15.5)
WBC: 9 10*3/uL (ref 4.0–10.5)

## 2013-12-12 LAB — TYPE AND SCREEN
ABO/RH(D): O POS
Antibody Screen: NEGATIVE

## 2013-12-12 LAB — URIC ACID: Uric Acid, Serum: 4.2 mg/dL (ref 2.4–7.0)

## 2013-12-12 LAB — LACTATE DEHYDROGENASE: LDH: 241 U/L (ref 94–250)

## 2013-12-12 MED ORDER — TERBUTALINE SULFATE 1 MG/ML IJ SOLN
0.2500 mg | Freq: Once | INTRAMUSCULAR | Status: AC
  Administered 2013-12-12: 0.25 mg via SUBCUTANEOUS

## 2013-12-12 MED ORDER — LABETALOL HCL 5 MG/ML IV SOLN: 40.0000 mg | INTRAVENOUS | Status: DC | PRN

## 2013-12-12 MED ORDER — ZOLPIDEM TARTRATE 5 MG PO TABS: 5.0000 mg | ORAL_TABLET | Freq: Every evening | ORAL | Status: DC | PRN

## 2013-12-12 MED ORDER — TERBUTALINE SULFATE 1 MG/ML IJ SOLN
INTRAMUSCULAR | Status: AC
  Administered 2013-12-12: 0.25 mg via SUBCUTANEOUS
  Filled 2013-12-12: qty 1

## 2013-12-12 MED ORDER — LABETALOL HCL 5 MG/ML IV SOLN
40.0000 mg | Freq: Once | INTRAVENOUS | Status: AC
  Administered 2013-12-12: 40 mg via INTRAVENOUS
  Filled 2013-12-12: qty 8

## 2013-12-12 MED ORDER — LABETALOL HCL 200 MG PO TABS
400.0000 mg | ORAL_TABLET | Freq: Three times a day (TID) | ORAL | Status: DC
  Administered 2013-12-12 – 2013-12-17 (×15): 400 mg via ORAL
  Filled 2013-12-12 (×15): qty 2

## 2013-12-12 MED ORDER — BETAMETHASONE SOD PHOS & ACET 6 (3-3) MG/ML IJ SUSP
12.0000 mg | Freq: Once | INTRAMUSCULAR | Status: AC
  Administered 2013-12-12: 12 mg via INTRAMUSCULAR
  Filled 2013-12-12: qty 2

## 2013-12-12 MED ORDER — LACTATED RINGERS IV SOLN
INTRAVENOUS | Status: DC
  Administered 2013-12-12: 18:00:00 via INTRAVENOUS

## 2013-12-12 MED ORDER — ACETAMINOPHEN 325 MG PO TABS: 650.0000 mg | ORAL_TABLET | ORAL | Status: DC | PRN

## 2013-12-12 MED ORDER — DOCUSATE SODIUM 100 MG PO CAPS
100.0000 mg | ORAL_CAPSULE | Freq: Every day | ORAL | Status: DC
Start: 2013-12-12 — End: 2013-12-17
  Administered 2013-12-13 – 2013-12-16 (×4): 100 mg via ORAL
  Filled 2013-12-12 (×4): qty 1

## 2013-12-12 MED ORDER — ZOLPIDEM TARTRATE 5 MG PO TABS
5.0000 mg | ORAL_TABLET | Freq: Every evening | ORAL | Status: DC | PRN
  Administered 2013-12-12 – 2013-12-14 (×3): 5 mg via ORAL
  Filled 2013-12-12 (×3): qty 1

## 2013-12-12 MED ORDER — PRENATAL MULTIVITAMIN CH
1.0000 | ORAL_TABLET | Freq: Every day | ORAL | Status: DC
  Administered 2013-12-13 – 2013-12-17 (×5): 1 via ORAL
  Filled 2013-12-12 (×5): qty 1

## 2013-12-12 MED ORDER — BETAMETHASONE SOD PHOS & ACET 6 (3-3) MG/ML IJ SUSP
12.0000 mg | Freq: Once | INTRAMUSCULAR | Status: AC
  Administered 2013-12-13: 12 mg via INTRAMUSCULAR
  Filled 2013-12-12: qty 2

## 2013-12-12 MED ORDER — CALCIUM CARBONATE ANTACID 500 MG PO CHEW: 2.0000 | CHEWABLE_TABLET | ORAL | Status: DC | PRN

## 2013-12-12 MED ORDER — METHIMAZOLE 10 MG PO TABS
20.0000 mg | ORAL_TABLET | Freq: Every day | ORAL | Status: DC
  Administered 2013-12-12 – 2013-12-17 (×6): 20 mg via ORAL
  Filled 2013-12-12 (×6): qty 2

## 2013-12-12 NOTE — ED Notes (Signed)
Pt admitted to room 158 per Dr. Gaynell Face.  Taken to room 158 via wheelchair.  Report to [place], [place]

## 2013-12-12 NOTE — Progress Notes (Signed)
[facility]  Indication: 23 yr old [id number] at [redacted]w[redacted]d with chronic hypertension and hyperthryroidism for BPP.  Findings: 1. Single intrauterine pregnancy. 2. Anterior placenta without evidence of previa. 3. Normal amniotic fluid index. 4. Normal biophysical profile of 8/8.  Recommendations: 1. Hypertension: - BP today was 174/106 and 185/107 - patient reports intermittent headaches - given this I have recommended patient be evaluated in MAU with labs and serial blood pressures; will likely need admission for BP control and evaluation for preeclampsia - recommend administer IV labetalol to decrease blood pressure and oral labetalol should be changed to 400mg  tid and increased as needed to keep blood pressures in the mild range - patient should have a full evaluation for preeclampsia and if there is concern for preeclampsia recommend a course of betamethasone and delivery timing based on severity of clinical picture - recommend magnesium sulfate if suspect severe preeclampsia while work up is being conducted and if delivery is indicated - recommend fetal growth every 4 weeks; due in 3 weeks - recommend continue antenatal testing 2. Hyperthryoidism: - on methimazole - recommend follow TFTs at least once a trimester - fetal surveillance as above  Discussed with Dr. Gaynell Face and patient sent to MAU  Eulis Foster, MD

## 2013-12-13 LAB — CREATININE CLEARANCE, URINE, 24 HOUR
CREATININE: 0.42 mg/dL — AB (ref 0.50–1.10)
Collection Interval-CRCL: 24 hours
Creatinine Clearance: 230 mL/min — ABNORMAL HIGH (ref 75–115)
Creatinine, 24H Ur: 1391 mg/d (ref 700–1800)
Creatinine, Urine: 92.76 mg/dL
Urine Total Volume-CRCL: 1500 mL

## 2013-12-13 LAB — CBC
HEMATOCRIT: 30 % — AB (ref 36.0–46.0)
Hemoglobin: 10 g/dL — ABNORMAL LOW (ref 12.0–15.0)
MCH: 24.8 pg — ABNORMAL LOW (ref 26.0–34.0)
MCHC: 33.3 g/dL (ref 30.0–36.0)
MCV: 74.3 fL — AB (ref 78.0–100.0)
PLATELETS: 238 10*3/uL (ref 150–400)
RBC: 4.04 MIL/uL (ref 3.87–5.11)
RDW: 14.8 % (ref 11.5–15.5)
WBC: 8.1 10*3/uL (ref 4.0–10.5)

## 2013-12-13 LAB — T4, FREE: Free T4: 3.64 ng/dL — ABNORMAL HIGH (ref 0.80–1.80)

## 2013-12-13 NOTE — Progress Notes (Signed)
Patient ID: Marilyn Norman, female   DOB: [birth year], 23 y.o.   MRN: [id number] Blood pressure 150/89 this a.m.  she received 40 mg of labetalol IV x1 on admission and is doing well There is a 24 urine collection for protein and creatinine in progress at this time She has no complaints and is doing well

## 2013-12-13 NOTE — H&P (Signed)
This is Dr. Francoise Ceo dictating the history and physical on  Marilyn Norman  she's a 23 year old a chronic hypertens ive  gravida 5 para 09/24/2000 at 33 weeks and 2 days [facility] 01/29/2014 followed with MFM she was admitted for control of her blood pressure she is on labetalol 400 every 8 hours and she was seen MFM yesterday with a blood pressure of  190/  107 she received labetalol 40 mg IV x1 overnight and her blood pressure is now 150  /89  she's asymptomatic and she is having a 24 urine collection done  At  this time and daily NSTs she's supposed  To be  seen by MFM today she is also  Hyperthyroid  and she is on  mwthamazole  10 mg by mouth 3 times a day she's not contracting she also  Got  betamethasone 12.5 mg IM on admission to receive a second dose today Past medical history history of chronic hypertension Past surgical history negative Social history negative System review negative Physical exam well-developed female in no distress HEENT negative Lungs clear to P&A Heart regular rhythm no murmurs no gallops Breasts negative Abdomen 34 weeks Pelvic deferred Extremities negative

## 2013-12-13 NOTE — Progress Notes (Signed)
UR completed 

## 2013-12-14 ENCOUNTER — Other Ambulatory Visit (HOSPITAL_COMMUNITY): Payer: Medicaid Other

## 2013-12-14 ENCOUNTER — Inpatient Hospital Stay (HOSPITAL_COMMUNITY): Payer: Medicaid Other

## 2013-12-14 LAB — PROTEIN, URINE, 24 HOUR
Collection Interval-UPROT: 24 hours
PROTEIN 24H UR: 120 mg/d — AB (ref 50–100)
Protein, Urine: 8 mg/dL
URINE TOTAL VOLUME-UPROT: 1500 mL

## 2013-12-14 LAB — CBC
HCT: 28.6 % — ABNORMAL LOW (ref 36.0–46.0)
Hemoglobin: 9.4 g/dL — ABNORMAL LOW (ref 12.0–15.0)
MCH: 24.5 pg — ABNORMAL LOW (ref 26.0–34.0)
MCHC: 32.9 g/dL (ref 30.0–36.0)
MCV: 74.7 fL — AB (ref 78.0–100.0)
PLATELETS: 222 10*3/uL (ref 150–400)
RBC: 3.83 MIL/uL — AB (ref 3.87–5.11)
RDW: 15.2 % (ref 11.5–15.5)
WBC: 10.5 10*3/uL (ref 4.0–10.5)

## 2013-12-14 NOTE — Progress Notes (Signed)
Discussed with Dr. Clearance Coots patients BP and spots at this time.  Reviewed BP's the last 24 hours .  Orders received for MFM consult.

## 2013-12-14 NOTE — Progress Notes (Signed)
Patient ID: Marilyn Norman, female   DOB: 09-16-1990, 23 y.o.   MRN: [id number] Hospital Day: 3           S:  No complaints. O: Blood pressure 161/89, pulse 96, temperature 98.4 F (36.9 C), temperature source Oral, resp. rate 18, height 5\' 4"  (1.626 m), weight 206 lb (93.441 kg), unknown if currently breastfeeding.   WUJ:WJXBJYNW: 140-150 bpm Toco: None SVE:   A/P- 23 y.o. admitted with:  Hypertension.  Stable on Labetalol.  MFM consult pending.  Present on Admission:  . Elevated BP  Pregnancy Complications: hypertension  Preterm labor management: no treatment necessary Dating:  [redacted]w[redacted]d PNL Needed:  none FWB:  good PTL:  stable

## 2013-12-14 NOTE — Consult Note (Signed)
Maternal Fetal Medicine Consultation  Requesting Provider(s): Coral Ceo, MD  Reason for consultation: rule out superimposed preeclampsia  HPI: Marilyn Norman is a 23 yo [id number] currently at [redacted]w[redacted]d who was admitted due to severe range blood pressures.  Marilyn Norman has been seen previously by MFM.  She has a history of chronic hypertension that was first diagnosed in 2008 with her first pregnancy.  She was previously on Lisinopril which was discontinued during the first trimester and she was transitioned to Labetalol.  She is currently on Labetalol 200 mg TID - blood pressures at the time of admission where 174/ 106 and 185/107.  She received a single dose of IV Labetalol at the time of admission.  Blood pressures have continued to be elevated in the 155-170/80-90 ranges.  Her 24-hr urine protein was 120 mg and the remainder of the preeclampsia labs were within normal limits.  The patient received a course of betamethasone during this admission.  She reports some headaches that improve with Tylenol and scotomata.  She denies any headaches currently.  She denies RUQ pain or contractions.  The patient also has a history of hyperthyroidism.  She is currently on Methimazole and is followed by Endocrinology. Free T4 drawn at the time of admission was 3.64 (0.8-1.80).  The patient also reports a previous cardiology work up several years ago - was told that she had "fluid on the heart" that has since improved - has had no further follow up and is otherwise asymptomatic. (records unavailable for review)  OB History: OB History   Grav Para Term Preterm Abortions TAB SAB Ect Mult Living   5 2 2  2  2   2       PMH:  Past Medical History  Diagnosis Date  . Hypertension   . Chest pain   . Cardiomegaly   . Costochondritis   . Thyroid disease     hyperthyroid  . Hyperthyroidism   . Chlamydia   . Trichomonas infection     PSH:  Past Surgical History  Procedure Laterality Date  . Cesarean section      Meds:  Scheduled Meds: . docusate sodium  100 mg Oral Daily  . labetalol  400 mg Oral Q8H  . methimazole  20 mg Oral Daily  . prenatal multivitamin  1 tablet Oral Q1200   Continuous Infusions: . lactated ringers 50 mL/hr at 12/12/13 1745   PRN Meds:.acetaminophen, calcium carbonate, labetalol, zolpidem  Allergies: Allergies  Allergen Reactions  . Strawberry Itching, Rash, Hives and Swelling   FH:  Family History  Problem Relation Age of Onset  . Asthma Mother   . Hearing loss Father    Soc:  History   Social History  . Marital Status: Single    Spouse Name: N/A    Number of Children: N/A  . Years of Education: N/A   Occupational History  . Not on file.   Social History Main Topics  . Smoking status: Former Games developer  . Smokeless tobacco: Not on file  . Alcohol Use: No  . Drug Use: Yes    Special: Marijuana     Comment: Used 1 week ago.  Marland Kitchen Sexual Activity: Yes    Birth Control/ Protection: Condom     Comment: Last intercourse 2 wks ago   Other Topics Concern  . Not on file   Social History Narrative  . No narrative on file    Review of Systems: no vaginal bleeding or cramping/contractions, no LOF, no nausea/vomiting.  All other systems reviewed and are negative.  PE:   Filed Vitals:   12/14/13 0818  BP: 161/89  Pulse: 96  Temp:   Resp:     GEN: well-appearing female ABD: gravid, NT LE: 1+ pitting edema Neuro: 2+ DTR no clonus  Labs: CBC    Component Value Date/Time   WBC 10.5 12/14/2013 0557   RBC 3.83* 12/14/2013 0557   HGB 9.4* 12/14/2013 0557   HCT 28.6* 12/14/2013 0557   PLT 222 12/14/2013 0557   MCV 74.7* 12/14/2013 0557   MCH 24.5* 12/14/2013 0557   MCHC 32.9 12/14/2013 0557   RDW 15.2 12/14/2013 0557   LYMPHSABS 2.9 06/21/2013 1617   MONOABS 0.8 06/21/2013 1617   EOSABS 0.2 06/21/2013 1617   BASOSABS 0.0 06/21/2013 1617   CMP     Component Value Date/Time   NA 138 12/12/2013 1756   K 4.1 12/12/2013 1756   CL 103 12/12/2013 1756    CO2 21 12/12/2013 1756   GLUCOSE 85 12/12/2013 1756   BUN 7 12/12/2013 1756   CREATININE 0.42* 12/12/2013 1845   CREATININE 0.42* 12/12/2013 1756   CALCIUM 8.4 12/12/2013 1756   PROT 6.7 12/12/2013 1756   ALBUMIN 2.5* 12/12/2013 1756   AST 17 12/12/2013 1756   ALT 16 12/12/2013 1756   ALKPHOS 266* 12/12/2013 1756   BILITOT 0.5 12/12/2013 1756   GFRNONAA >90 12/12/2013 1756   GFRAA >90 12/12/2013 1756   Ultrasound 4/15: EFW at the 57th %tile.  A/P: 1) Single IUP at 6624w3d         2) Chronic hypertension, rule out superimposed preeclampsia - Ms. Marilyn Norman continues to have some severe range blood pressures despite her current dose of Labetalol.  Based on the patient's current lab work, it does not appear that she has superimposed preeclampsia but rather resistant chronic hypertension or worsening gestational hypertension.         3) Hyperthyroidism - currently followed by Endocrinology.  Free T4 is still elevated despite Methimazole         4) ? Cardiomegaly vs. Pericardial effusion - now resolved   Recommend: 1)  Increase dose of Labetalol - would increase to 400 mg TID today and may titrate dose upward to max of 800 mg TID.  Would work for a target blood pressure of approximately 140/90's.  If able to keep blood pressures in this range, and the patient otherwise remains stable, would feel comfortable managing as an outpatient. 2)  Would check preeclampsia labs at least weekly (CBC, CMP, urine protein/creat ratio) - would move toward delivery at 34 weeks is preeclampsia with severe features noted (LFTs > 2x nl, thrombocytopenia < 100, severe headaches, Creat > 1.2) 3) 2x weekly NSTs with weekly AFIs 4) Serial growth scans every 3-4 weeks 5) If otherwise stable, recommend delivery at 37 weeks. 6) Recommend follow up or consultation with Endocrinology - may need to adjust Methimazole dose based on elevated fT4 this admission 7) Consider repeat maternal echo after delivery given ? Pericardial effusion  or cardiomegaly    Thank you for the opportunity to be a part of the care of Marilyn Norman. Please contact our office if we can be of further assistance.   I spent approximately 30 minutes with this patient with over 50% of time spent in face-to-face counseling.  Alpha GulaPaul Gerritt Galentine, MD Maternal Fetal Medicine

## 2013-12-15 LAB — TYPE AND SCREEN
ABO/RH(D): O POS
Antibody Screen: NEGATIVE

## 2013-12-15 LAB — CBC
HEMATOCRIT: 29.3 % — AB (ref 36.0–46.0)
Hemoglobin: 9.7 g/dL — ABNORMAL LOW (ref 12.0–15.0)
MCH: 24.9 pg — ABNORMAL LOW (ref 26.0–34.0)
MCHC: 33.1 g/dL (ref 30.0–36.0)
MCV: 75.3 fL — ABNORMAL LOW (ref 78.0–100.0)
Platelets: 241 10*3/uL (ref 150–400)
RBC: 3.89 MIL/uL (ref 3.87–5.11)
RDW: 15.5 % (ref 11.5–15.5)
WBC: 11.3 10*3/uL — ABNORMAL HIGH (ref 4.0–10.5)

## 2013-12-15 NOTE — Progress Notes (Signed)
Patient ID: Marilyn Norman, female   DOB: [birth year], 23 y.o.   MRN: [id number] Hospital Day: 4  S: No complaints.  O: Blood pressure 168/80, pulse 89, temperature 98.2 F (36.8 C), temperature source Oral, resp. rate 20, height 5\' 4"  (1.626 m), weight 206 lb (93.441 kg), unknown if currently breastfeeding.   VVK:PQAESLPN: 140-150 bpm Toco: None SVE:   A/P- 23 y.o. admitted with:  Hypertension, chronic.  Responding well to therapy.  Continue Labetalol.  Present on Admission:  . Elevated BP  Pregnancy Complications: hypertension  Preterm labor management: no treatment necessary Dating:  [redacted]w[redacted]d PNL Needed:  none FWB:  good PTL:  none

## 2013-12-16 LAB — CBC
HEMATOCRIT: 29.8 % — AB (ref 36.0–46.0)
Hemoglobin: 9.7 g/dL — ABNORMAL LOW (ref 12.0–15.0)
MCH: 24.6 pg — ABNORMAL LOW (ref 26.0–34.0)
MCHC: 32.6 g/dL (ref 30.0–36.0)
MCV: 75.6 fL — ABNORMAL LOW (ref 78.0–100.0)
PLATELETS: 240 10*3/uL (ref 150–400)
RBC: 3.94 MIL/uL (ref 3.87–5.11)
RDW: 15.3 % (ref 11.5–15.5)
WBC: 14.1 10*3/uL — AB (ref 4.0–10.5)

## 2013-12-16 NOTE — Progress Notes (Signed)
Patient ID: Marilyn Norman, female   DOB: 08/07/1991, 23 y.o.   MRN: [id number] Hospital Day: 5  S: No complaints.  O: Blood pressure 156/80, pulse 87, temperature 98.3 F (36.8 C), temperature source Oral, resp. rate 20, height 5\' 4"  (1.626 m), weight 206 lb (93.441 kg), unknown if currently breastfeeding.   HDQ:QIWLNLGX: 140 bpm Toco: None SVE:   A/P- 23 y.o. admitted with:  Chronic Hypertension in pregnancy.  H/O Hyperthyroidism.  On Methimazole.  Stable.  Continue Labetalol at 400mg  q 8 hours.  Discharge planning for tomorrow.  Hyperthyroidism.  Needs follow up appointment with her Endocrinologist to re-evaluate Methimazole dosage.  Present on Admission:  . Elevated BP  Pregnancy Complications: hypertension  Preterm labor management: no treatment necessary Dating:  [redacted]w[redacted]d PNL Needed:  none FWB:  good PTL:  none

## 2013-12-17 ENCOUNTER — Other Ambulatory Visit (HOSPITAL_COMMUNITY): Payer: Medicaid Other

## 2013-12-17 ENCOUNTER — Ambulatory Visit (HOSPITAL_COMMUNITY)
Admission: RE | Admit: 2013-12-17 | Discharge: 2013-12-17 | Disposition: A | Discharge status: Home / Self Care | Payer: Medicaid Other | Source: Ambulatory Visit | Attending: Obstetrics | Admitting: Obstetrics

## 2013-12-17 DIAGNOSIS — IMO0002 Reserved for concepts with insufficient information to code with codable children: Secondary | ICD-10-CM

## 2013-12-17 DIAGNOSIS — O34219 Maternal care for unspecified type scar from previous cesarean delivery: Secondary | ICD-10-CM

## 2013-12-17 DIAGNOSIS — O139 Gestational [pregnancy-induced] hypertension without significant proteinuria, unspecified trimester: Secondary | ICD-10-CM

## 2013-12-17 DIAGNOSIS — E079 Disorder of thyroid, unspecified: Secondary | ICD-10-CM

## 2013-12-17 DIAGNOSIS — O99280 Endocrine, nutritional and metabolic diseases complicating pregnancy, unspecified trimester: Secondary | ICD-10-CM

## 2013-12-17 DIAGNOSIS — O09299 Supervision of pregnancy with other poor reproductive or obstetric history, unspecified trimester: Secondary | ICD-10-CM

## 2013-12-17 LAB — CBC
HCT: 30.2 % — ABNORMAL LOW (ref 36.0–46.0)
Hemoglobin: 9.9 g/dL — ABNORMAL LOW (ref 12.0–15.0)
MCH: 24.8 pg — ABNORMAL LOW (ref 26.0–34.0)
MCHC: 32.8 g/dL (ref 30.0–36.0)
MCV: 75.5 fL — ABNORMAL LOW (ref 78.0–100.0)
Platelets: 273 10*3/uL (ref 150–400)
RBC: 4 MIL/uL (ref 3.87–5.11)
RDW: 15.4 % (ref 11.5–15.5)
WBC: 13 10*3/uL — ABNORMAL HIGH (ref 4.0–10.5)

## 2013-12-17 NOTE — Discharge Summary (Signed)
  Patient is 33+ weeks pregnant and was admitted for control of her blood pressures has a history of chronic hypertension and there was no preeclampsia her labetalol was increased to 400 mg 3 times a day and she has done well on that dosage she discharge today to see her endocrinologist today and MFM on Friday and we'll see me on Monday

## 2013-12-17 NOTE — Progress Notes (Signed)
Ur chart review completed.  

## 2013-12-17 NOTE — Progress Notes (Signed)
Patient ID: Marilyn Norman, female   DOB: 05/05/1991, 23 y.o.   MRN: [id number] Blood pressure 135/27 there much better than they were on admission her preeclamptic labs-year-old negative patient feels fine she discharge today to see her endocrine outages today and MFM on Friday and and I will see her next week she'll go home on labetalol 400 mg 3 times a day

## 2013-12-17 NOTE — Discharge Instructions (Signed)
Discharge instructions   You can wash your hair  Shower  Eat what you want  Drink what you want  See me in 6 weeks  Your ankles are going to swell more in the next 2 weeks than when pregnant  No sex for 6 weeks   Marilyn CosierBernard A Marshall, MD 12/17/2013  Hypertension During Pregnancy Hypertension is also called high blood pressure. It can occur at any time in life and during pregnancy. When you have hypertension, there is extra pressure inside your blood vessels that carry blood from the heart to the rest of your body (arteries). Hypertension during pregnancy can cause problems for you and your baby. Your baby might not weigh as much as it should at birth or might be born early (premature). Very bad cases of hypertension during pregnancy can be life threatening.  Different types of hypertension can occur during pregnancy.  Chronic hypertension. This happens when a woman has hypertension before pregnancy and it continues during pregnancy. Gestational hypertension. This is when hypertension develops during pregnancy. Preeclampsia or toxemia of pregnancy. This is a very serious type of hypertension that develops only during pregnancy. It is a disease that affects the whole body (systemic) and can be very dangerous for both mother and baby.  Gestational hypertension and preeclampsia usually go away after your baby is born. Blood pressure generally stabilizes within 6 weeks. Women who have hypertension during pregnancy have a greater chance of developing hypertension later in life or with future pregnancies. RISK FACTORS Some factors make you more likely to develop hypertension during pregnancy. Risk factors include: Having hypertension before pregnancy. Having hypertension during a previous pregnancy. Being overweight. Being older than 40. Being pregnant with more than one baby (multiples). Having diabetes or kidney problems. SIGNS AND SYMPTOMS Chronic and gestational hypertension rarely  cause symptoms. Preeclampsia has symptoms, which may include: Increased protein in your urine. Your health care provider will check for this at every prenatal visit. Swelling of your hands and face. Rapid weight gain. Headaches. Visual changes. Being bothered by light. Abdominal pain, especially in the right upper area. Chest pain. Shortness of breath. Increased reflexes. Seizures. Seizures occur with a more severe form of preeclampsia, called eclampsia. DIAGNOSIS  You may be diagnosed with hypertension during a regular prenatal exam. At each visit, tests may include: Blood pressure checks. A urine test to check for protein in your urine. The type of hypertension you are diagnosed with depends on when you developed it. It also depends on your specific blood pressure reading. Developing hypertension before 20 weeks of pregnancy is consistent with chronic hypertension. Developing hypertension after 20 weeks of pregnancy is consistent with gestational hypertension. Hypertension with increased urinary protein is diagnosed as preeclampsia. Blood pressure measurements that stay above 160 systolic or 110 diastolic are a sign of severe preeclampsia. TREATMENT Treatment for hypertension during pregnancy varies. Treatment depends on the type of hypertension and how serious it is. If you take medicine for chronic hypertension, you may need to switch medicines. Drugs called ACE inhibitors should not be taken during pregnancy. Low-dose aspirin may be suggested for women who have risk factors for preeclampsia. If you have gestational hypertension, you may need to take a blood pressure medicine that is safe during pregnancy. Your health care provider will recommend the appropriate medicine. If you have severe preeclampsia, you may need to be in the hospital. Health care providers will watch you and the baby very closely. You also may need to take medicine (magnesium sulfate) to  prevent seizures and lower  blood pressure. Sometimes an early delivery is needed. This may be the case if the condition worsens. It would be done to protect you and the baby. The only cure for preeclampsia is delivery. HOME CARE INSTRUCTIONS Schedule and keep all of your regular appointments for prenatal care. Only take over-the-counter or prescription medicines as directed by your health care provider. Tell your health care provider about all medicines you take. Eat as little salt as possible. Get regular exercise. Do not drink alcohol. Do not use tobacco products. Do not drink products with caffeine. Lie on your left side when resting. SEEK IMMEDIATE MEDICAL CARE IF: You have severe abdominal pain. You have sudden swelling in the hands, ankles, or face. You gain 4 pounds (1.8 kg) or more in 1 week. You vomit repeatedly. You have vaginal bleeding. You do not feel the baby moving as much. You have a headache. You have blurred or double vision. You have muscle twitching or spasms. You have shortness of breath. You have blue fingernails and lips. You have blood in your urine. MAKE SURE YOU: Understand these instructions. Will watch your condition. Will get help right away if you are not doing well or get worse. Document Released: 04/27/2011 Document Revised: 05/30/2013 Document Reviewed: 03/08/2013 [facility] Patient Information 2014 [place], [place]. Hypertension As your heart beats, it forces blood through your arteries. This force is your blood pressure. If the pressure is too high, it is called hypertension (HTN) or high blood pressure. HTN is dangerous because you may have it and not know it. High blood pressure may mean that your heart has to work harder to pump blood. Your arteries may be narrow or stiff. The extra work puts you at risk for heart disease, stroke, and other problems.  Blood pressure consists of two numbers, a higher number over a lower, 110/72, for example. It is stated as "110 over 72." The  ideal is below 120 for the top number (systolic) and under 80 for the bottom (diastolic). Write down your blood pressure today. You should pay close attention to your blood pressure if you have certain conditions such as: Heart failure. Prior heart attack. Diabetes Chronic kidney disease. Prior stroke. Multiple risk factors for heart disease. To see if you have HTN, your blood pressure should be measured while you are seated with your arm held at the level of the heart. It should be measured at least twice. A one-time elevated blood pressure reading (especially in the Emergency Department) does not mean that you need treatment. There may be conditions in which the blood pressure is different between your right and left arms. It is important to see your caregiver soon for a recheck. Most people have essential hypertension which means that there is not a specific cause. This type of high blood pressure may be lowered by changing lifestyle factors such as: Stress. Smoking. Lack of exercise. Excessive weight. Drug/tobacco/alcohol use. Eating less salt. Most people do not have symptoms from high blood pressure until it has caused damage to the body. Effective treatment can often prevent, delay or reduce that damage. TREATMENT  When a cause has been identified, treatment for high blood pressure is directed at the cause. There are a large number of medications to treat HTN. These fall into several categories, and your caregiver will help you select the medicines that are best for you. Medications may have side effects. You should review side effects with your caregiver. If your blood pressure stays high  after you have made lifestyle changes or started on medicines,  Your medication(s) may need to be changed. Other problems may need to be addressed. Be certain you understand your prescriptions, and know how and when to take your medicine. Be sure to follow up with your caregiver within the time frame  advised (usually within two weeks) to have your blood pressure rechecked and to review your medications. If you are taking more than one medicine to lower your blood pressure, make sure you know how and at what times they should be taken. Taking two medicines at the same time can result in blood pressure that is too low. SEEK IMMEDIATE MEDICAL CARE IF: You develop a severe headache, blurred or changing vision, or confusion. You have unusual weakness or numbness, or a faint feeling. You have severe chest or abdominal pain, vomiting, or breathing problems. MAKE SURE YOU:  Understand these instructions. Will watch your condition. Will get help right away if you are not doing well or get worse. Document Released: 08/09/2005 Document Revised: 11/01/2011 Document Reviewed: 03/29/2008 [facility] Patient Information 2014 [place], [place]. Fetal Movement Counts Patient Name: __________________________________________________ Patient Due Date: ____________________ Performing a fetal movement count is highly recommended in high-risk pregnancies, but it is good for every pregnant woman to do. Your caregiver may ask you to start counting fetal movements at 28 weeks of the pregnancy. Fetal movements often increase: After eating a full meal. After physical activity. After eating or drinking something sweet or cold. At rest. Pay attention to when you feel the baby is most active. This will help you notice a pattern of your baby's sleep and wake cycles and what factors contribute to an increase in fetal movement. It is important to perform a fetal movement count at the same time each day when your baby is normally most active.  HOW TO COUNT FETAL MOVEMENTS Find a quiet and comfortable area to sit or lie down on your left side. Lying on your left side provides the best blood and oxygen circulation to your baby. Write down the day and time on a sheet of paper or in a journal. Start counting kicks, flutters,  swishes, rolls, or jabs in a 2 hour period. You should feel at least 10 movements within 2 hours. If you do not feel 10 movements in 2 hours, wait 2 3 hours and count again. Look for a change in the pattern or not enough counts in 2 hours. SEEK MEDICAL CARE IF: You feel less than 10 counts in 2 hours, tried twice. There is no movement in over an hour. The pattern is changing or taking longer each day to reach 10 counts in 2 hours. You feel the baby is not moving as he or she usually does. Date: ____________ Movements: ____________ Start time: ____________ Doreatha Martin time: ____________  Date: ____________ Movements: ____________ Start time: ____________ Doreatha Martin time: ____________ Date: ____________ Movements: ____________ Start time: ____________ Doreatha Martin time: ____________ Date: ____________ Movements: ____________ Start time: ____________ Doreatha Martin time: ____________ Date: ____________ Movements: ____________ Start time: ____________ Doreatha Martin time: ____________ Date: ____________ Movements: ____________ Start time: ____________ Doreatha Martin time: ____________ Date: ____________ Movements: ____________ Start time: ____________ Doreatha Martin time: ____________ Date: ____________ Movements: ____________ Start time: ____________ Doreatha Martin time: ____________  Date: ____________ Movements: ____________ Start time: ____________ Doreatha Martin time: ____________ Date: ____________ Movements: ____________ Start time: ____________ Doreatha Martin time: ____________ Date: ____________ Movements: ____________ Start time: ____________ Doreatha Martin time: ____________ Date: ____________ Movements: ____________ Start time: ____________ Doreatha Martin time: ____________ Date: ____________ Movements: ____________ Start time:  ____________ Doreatha Martin time: ____________ Date: ____________ Movements: ____________ Start time: ____________ Doreatha Martin time: ____________ Date: ____________ Movements: ____________ Start time: ____________ Doreatha Martin time: ____________  Date: ____________  Movements: ____________ Start time: ____________ Doreatha Martin time: ____________ Date: ____________ Movements: ____________ Start time: ____________ Doreatha Martin time: ____________ Date: ____________ Movements: ____________ Start time: ____________ Doreatha Martin time: ____________ Date: ____________ Movements: ____________ Start time: ____________ Doreatha Martin time: ____________ Date: ____________ Movements: ____________ Start time: ____________ Doreatha Martin time: ____________ Date: ____________ Movements: ____________ Start time: ____________ Doreatha Martin time: ____________ Date: ____________ Movements: ____________ Start time: ____________ Doreatha Martin time: ____________  Date: ____________ Movements: ____________ Start time: ____________ Doreatha Martin time: ____________ Date: ____________ Movements: ____________ Start time: ____________ Doreatha Martin time: ____________ Date: ____________ Movements: ____________ Start time: ____________ Doreatha Martin time: ____________ Date: ____________ Movements: ____________ Start time: ____________ Doreatha Martin time: ____________ Date: ____________ Movements: ____________ Start time: ____________ Doreatha Martin time: ____________ Date: ____________ Movements: ____________ Start time: ____________ Doreatha Martin time: ____________ Date: ____________ Movements: ____________ Start time: ____________ Doreatha Martin time: ____________  Date: ____________ Movements: ____________ Start time: ____________ Doreatha Martin time: ____________ Date: ____________ Movements: ____________ Start time: ____________ Doreatha Martin time: ____________ Date: ____________ Movements: ____________ Start time: ____________ Doreatha Martin time: ____________ Date: ____________ Movements: ____________ Start time: ____________ Doreatha Martin time: ____________ Date: ____________ Movements: ____________ Start time: ____________ Doreatha Martin time: ____________ Date: ____________ Movements: ____________ Start time: ____________ Doreatha Martin time: ____________ Date: ____________ Movements: ____________ Start time:  ____________ Doreatha Martin time: ____________  Date: ____________ Movements: ____________ Start time: ____________ Doreatha Martin time: ____________ Date: ____________ Movements: ____________ Start time: ____________ Doreatha Martin time: ____________ Date: ____________ Movements: ____________ Start time: ____________ Doreatha Martin time: ____________ Date: ____________ Movements: ____________ Start time: ____________ Doreatha Martin time: ____________ Date: ____________ Movements: ____________ Start time: ____________ Doreatha Martin time: ____________ Date: ____________ Movements: ____________ Start time: ____________ Doreatha Martin time: ____________ Date: ____________ Movements: ____________ Start time: ____________ Doreatha Martin time: ____________  Date: ____________ Movements: ____________ Start time: ____________ Doreatha Martin time: ____________ Date: ____________ Movements: ____________ Start time: ____________ Doreatha Martin time: ____________ Date: ____________ Movements: ____________ Start time: ____________ Doreatha Martin time: ____________ Date: ____________ Movements: ____________ Start time: ____________ Doreatha Martin time: ____________ Date: ____________ Movements: ____________ Start time: ____________ Doreatha Martin time: ____________ Date: ____________ Movements: ____________ Start time: ____________ Doreatha Martin time: ____________ Date: ____________ Movements: ____________ Start time: ____________ Doreatha Martin time: ____________  Date: ____________ Movements: ____________ Start time: ____________ Doreatha Martin time: ____________ Date: ____________ Movements: ____________ Start time: ____________ Doreatha Martin time: ____________ Date: ____________ Movements: ____________ Start time: ____________ Doreatha Martin time: ____________ Date: ____________ Movements: ____________ Start time: ____________ Doreatha Martin time: ____________ Date: ____________ Movements: ____________ Start time: ____________ Doreatha Martin time: ____________ Date: ____________ Movements: ____________ Start time: ____________ Doreatha Martin time:  ____________ Document Released: 09/08/2006 Document Revised: 07/26/2012 Document Reviewed: 06/05/2012 [employer] Patient Information 2014 [employer]. Preterm Labor Information Preterm labor is when labor starts at less than 37 weeks of pregnancy. The normal length of a pregnancy is 39 to 41 weeks. CAUSES Often, there is no identifiable underlying cause as to why a woman goes into preterm labor. One of the most common known causes of preterm labor is infection. Infections of the uterus, cervix, vagina, amniotic sac, bladder, kidney, or even the lungs (pneumonia) can cause labor to start. Other suspected causes of preterm labor include:  Urogenital infections, such as yeast infections and bacterial vaginosis.  Uterine abnormalities (uterine shape, uterine septum, fibroids, or bleeding from the placenta).  A cervix that has been operated on (it may fail to stay closed).  Malformations in the fetus.  Multiple gestations (twins, triplets, and so on).  Breakage of the amniotic sac.  RISK FACTORS  Having a previous history of preterm labor.  Having premature rupture of membranes (PROM).  Having a placenta that covers the opening of the cervix (placenta previa).  Having a placenta that separates from the uterus (placental abruption).  Having a cervix that is too weak to hold the fetus in the uterus (incompetent cervix).  Having too much fluid in the amniotic sac (polyhydramnios).  Taking illegal drugs or smoking while pregnant.  Not gaining enough weight while pregnant.  Being younger than 65 and older than 24 years old.  Having a low socioeconomic status.  Being African American. SYMPTOMS Signs and symptoms of preterm labor include:  Menstrual-like cramps, abdominal pain, or back pain. Uterine contractions that are regular, as frequent as six in an hour, regardless of their intensity (may be mild or painful). Contractions that start on the top of the uterus and spread down to  the lower abdomen and back.  A sense of increased pelvic pressure.  A watery or bloody mucus discharge that comes from the vagina.  TREATMENT Depending on the length of the pregnancy and other circumstances, your health care provider may suggest bed rest. If necessary, there are medicines that can be given to stop contractions and to mature the fetal lungs. If labor happens before 34 weeks of pregnancy, a prolonged hospital stay may be recommended. Treatment depends on the condition of both you and the fetus.  WHAT SHOULD YOU DO IF YOU THINK YOU ARE IN PRETERM LABOR? Call your health care provider right away. You will need to go to the hospital to get checked immediately. HOW CAN YOU PREVENT PRETERM LABOR IN FUTURE PREGNANCIES? You should:  Stop smoking if you smoke. Maintain healthy weight gain and avoid chemicals and drugs that are not necessary. Be watchful for any type of infection. Inform your health care provider if you have a known history of preterm labor. Document Released: 10/30/2003 Document Revised: 04/11/2013 Document Reviewed: 09/11/2012 [facility] Patient Information 2014 [place], [place].

## 2013-12-19 ENCOUNTER — Ambulatory Visit (HOSPITAL_COMMUNITY): Payer: Medicaid Other

## 2013-12-20 ENCOUNTER — Ambulatory Visit (HOSPITAL_COMMUNITY): Payer: Medicaid Other | Attending: Obstetrics

## 2013-12-24 ENCOUNTER — Encounter (HOSPITAL_COMMUNITY): Payer: Self-pay

## 2013-12-24 ENCOUNTER — Inpatient Hospital Stay (HOSPITAL_COMMUNITY)
Admission: AD | Admit: 2013-12-24 | Discharge: 2013-12-24 | Disposition: A | Discharge status: Home / Self Care | Payer: Medicaid Other | Source: Ambulatory Visit | Attending: Obstetrics | Admitting: Obstetrics

## 2013-12-24 ENCOUNTER — Ambulatory Visit (HOSPITAL_COMMUNITY)
Admit: 2013-12-24 | Discharge: 2013-12-24 | Disposition: A | Discharge status: Home / Self Care | Payer: Medicaid Other | Attending: Obstetrics | Admitting: Obstetrics

## 2013-12-24 ENCOUNTER — Inpatient Hospital Stay (HOSPITAL_COMMUNITY): Payer: Medicaid Other

## 2013-12-24 ENCOUNTER — Other Ambulatory Visit (HOSPITAL_COMMUNITY): Payer: Self-pay | Admitting: Obstetrics

## 2013-12-24 ENCOUNTER — Encounter (HOSPITAL_COMMUNITY): Payer: Self-pay | Admitting: *Deleted

## 2013-12-24 DIAGNOSIS — O23593 Infection of other part of genital tract in pregnancy, third trimester: Secondary | ICD-10-CM

## 2013-12-24 DIAGNOSIS — E079 Disorder of thyroid, unspecified: Secondary | ICD-10-CM | POA: Insufficient documentation

## 2013-12-24 DIAGNOSIS — B373 Candidiasis of vulva and vagina: Secondary | ICD-10-CM

## 2013-12-24 DIAGNOSIS — O34219 Maternal care for unspecified type scar from previous cesarean delivery: Secondary | ICD-10-CM | POA: Diagnosis not present

## 2013-12-24 DIAGNOSIS — O99280 Endocrine, nutritional and metabolic diseases complicating pregnancy, unspecified trimester: Secondary | ICD-10-CM

## 2013-12-24 DIAGNOSIS — O4700 False labor before 37 completed weeks of gestation, unspecified trimester: Secondary | ICD-10-CM | POA: Insufficient documentation

## 2013-12-24 DIAGNOSIS — O10019 Pre-existing essential hypertension complicating pregnancy, unspecified trimester: Secondary | ICD-10-CM | POA: Insufficient documentation

## 2013-12-24 DIAGNOSIS — O139 Gestational [pregnancy-induced] hypertension without significant proteinuria, unspecified trimester: Secondary | ICD-10-CM

## 2013-12-24 DIAGNOSIS — IMO0002 Reserved for concepts with insufficient information to code with codable children: Secondary | ICD-10-CM

## 2013-12-24 DIAGNOSIS — O09299 Supervision of pregnancy with other poor reproductive or obstetric history, unspecified trimester: Secondary | ICD-10-CM

## 2013-12-24 DIAGNOSIS — A5901 Trichomonal vulvovaginitis: Secondary | ICD-10-CM

## 2013-12-24 DIAGNOSIS — O10919 Unspecified pre-existing hypertension complicating pregnancy, unspecified trimester: Secondary | ICD-10-CM

## 2013-12-24 DIAGNOSIS — O2390 Unspecified genitourinary tract infection in pregnancy, unspecified trimester: Secondary | ICD-10-CM

## 2013-12-24 DIAGNOSIS — R109 Unspecified abdominal pain: Secondary | ICD-10-CM | POA: Insufficient documentation

## 2013-12-24 DIAGNOSIS — B3731 Acute candidiasis of vulva and vagina: Secondary | ICD-10-CM

## 2013-12-24 DIAGNOSIS — Z87891 Personal history of nicotine dependence: Secondary | ICD-10-CM | POA: Insufficient documentation

## 2013-12-24 DIAGNOSIS — O26859 Spotting complicating pregnancy, unspecified trimester: Secondary | ICD-10-CM

## 2013-12-24 DIAGNOSIS — O98819 Other maternal infectious and parasitic diseases complicating pregnancy, unspecified trimester: Secondary | ICD-10-CM | POA: Insufficient documentation

## 2013-12-24 LAB — URINE MICROSCOPIC-ADD ON

## 2013-12-24 LAB — WET PREP, GENITAL: Clue Cells Wet Prep HPF POC: NONE SEEN

## 2013-12-24 LAB — CBC
HCT: 35.6 % — ABNORMAL LOW (ref 36.0–46.0)
Hemoglobin: 11.6 g/dL — ABNORMAL LOW (ref 12.0–15.0)
MCH: 24.5 pg — ABNORMAL LOW (ref 26.0–34.0)
MCHC: 32.6 g/dL (ref 30.0–36.0)
MCV: 75.1 fL — ABNORMAL LOW (ref 78.0–100.0)
Platelets: 279 10*3/uL (ref 150–400)
RBC: 4.74 MIL/uL (ref 3.87–5.11)
RDW: 15.1 % (ref 11.5–15.5)
WBC: 13.2 10*3/uL — ABNORMAL HIGH (ref 4.0–10.5)

## 2013-12-24 LAB — URINALYSIS, ROUTINE W REFLEX MICROSCOPIC
Bilirubin Urine: NEGATIVE
GLUCOSE, UA: NEGATIVE mg/dL
Ketones, ur: NEGATIVE mg/dL
Nitrite: NEGATIVE
Protein, ur: NEGATIVE mg/dL
SPECIFIC GRAVITY, URINE: 1.01 (ref 1.005–1.030)
Urobilinogen, UA: 0.2 mg/dL (ref 0.0–1.0)
pH: 5.5 (ref 5.0–8.0)

## 2013-12-24 LAB — COMPREHENSIVE METABOLIC PANEL
ALBUMIN: 2.6 g/dL — AB (ref 3.5–5.2)
ALK PHOS: 251 U/L — AB (ref 39–117)
ALT: 14 U/L (ref 0–35)
AST: 11 U/L (ref 0–37)
BILIRUBIN TOTAL: 0.3 mg/dL (ref 0.3–1.2)
BUN: 10 mg/dL (ref 6–23)
CHLORIDE: 103 meq/L (ref 96–112)
CO2: 22 mEq/L (ref 19–32)
Calcium: 9.8 mg/dL (ref 8.4–10.5)
Creatinine, Ser: 0.47 mg/dL — ABNORMAL LOW (ref 0.50–1.10)
GFR calc Af Amer: >90 mL/min (ref >90)
GFR calc non Af Amer: >90 mL/min (ref >90)
Glucose, Bld: 88 mg/dL (ref 70–99)
POTASSIUM: 4.6 meq/L (ref 3.7–5.3)
Sodium: 137 mEq/L (ref 137–147)
Total Protein: 6.6 g/dL (ref 6.0–8.3)

## 2013-12-24 MED ORDER — METRONIDAZOLE 500 MG PO TABS
2000.0000 mg | ORAL_TABLET | ORAL | Status: AC
  Administered 2013-12-24: 2000 mg via ORAL
  Filled 2013-12-24: qty 4

## 2013-12-24 MED ORDER — FLUCONAZOLE 150 MG PO TABS: ORAL_TABLET | ORAL | Status: DC

## 2013-12-24 MED ORDER — FLUCONAZOLE 150 MG PO TABS
150.0000 mg | ORAL_TABLET | ORAL | Status: AC
Start: 2013-12-24 — End: 2013-12-24
  Administered 2013-12-24: 150 mg via ORAL
  Filled 2013-12-24: qty 1

## 2013-12-24 MED ORDER — LABETALOL HCL 100 MG PO TABS: 200.0000 mg | ORAL_TABLET | ORAL | Status: DC

## 2013-12-24 NOTE — MAU Note (Signed)
Patient states she went to the bathroom and had gush of bright red bleeding. Has started having abdominal cramping since the bleeding stated. States she has felt fetal movement but not as much as usual.

## 2013-12-24 NOTE — Progress Notes (Signed)
[facility] ultrasound  Indication: 23 yr old [id number] at [redacted]w[redacted]d with chronic hypertension and hyperthryroidism for AFI.  Findings: 1. Single intrauterine pregnancy. 2. Anterior placenta without evidence of previa. 3. Normal amniotic fluid index.  Recommendations: 1. Hypertension: - BP today was improved - recommend close surveillance for the development of signs/symptoms of preeclampsia; recent work up negative - recommend fetal growth every 4 weeks; due in 1 week - recommend continue antenatal testing 2. 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

## 2013-12-24 NOTE — Discharge Instructions (Signed)
Trichomoniasis Trichomoniasis is an infection, caused by the Trichomonas organism, that affects both women and men. In women, the outer female genitalia and the vagina are affected. In men, the penis is mainly affected, but the prostate and other reproductive organs can also be involved. Trichomoniasis is a sexually transmitted disease (STD) and is most often passed to another person through sexual contact. The majority of people who get trichomoniasis do so from a sexual encounter and are also at risk for other STDs. CAUSES   Sexual intercourse with an infected partner.  It can be present in swimming pools or hot tubs. SYMPTOMS   Abnormal gray-green frothy vaginal discharge in women.  Vaginal itching and irritation in women.  Itching and irritation of the area outside the vagina in women.  Penile discharge with or without pain in males.  Inflammation of the urethra (urethritis), causing painful urination.  Bleeding after sexual intercourse. RELATED COMPLICATIONS  Pelvic inflammatory disease.  Infection of the uterus (endometritis).  Infertility.  Tubal (ectopic) pregnancy.  It can be associated with other STDs, including gonorrhea and chlamydia, hepatitis B, and HIV. COMPLICATIONS DURING PREGNANCY  Early (premature) delivery.  Premature rupture of the membranes (PROM).  Low birth weight. DIAGNOSIS   Visualization of Trichomonas under the microscope from the vagina discharge.  Ph of the vagina greater than 4.5, tested with a test tape.  Trich Rapid Test.  Culture of the organism, but this is not usually needed.  It may be found on a Pap test.  Having a "strawberry cervix,"which means the cervix looks very red like a strawberry. TREATMENT   You may be given medication to fight the infection. Inform your caregiver if you could be or are pregnant. Some medications used to treat the infection should not be taken during pregnancy.  Over-the-counter medications or  creams to decrease itching or irritation may be recommended.  Your sexual partner will need to be treated if infected. HOME CARE INSTRUCTIONS   Take all medication prescribed by your caregiver.  Take over-the-counter medication for itching or irritation as directed by your caregiver.  Do not have sexual intercourse while you have the infection.  Do not douche or wear tampons.  Discuss your infection with your partner, as your partner may have acquired the infection from you. Or, your partner may have been the person who transmitted the infection to you.  Have your sex partner examined and treated if necessary.  Practice safe, informed, and protected sex.  See your caregiver for other STD testing. SEEK MEDICAL CARE IF:   You still have symptoms after you finish the medication.  You have an oral temperature above 102 F (38.9 C).  You develop belly (abdominal) pain.  You have pain when you urinate.  You have bleeding after sexual intercourse.  You develop a rash.  The medication makes you sick or makes you throw up (vomit). Document Released: 02/02/2001 Document Revised: 11/01/2011 Document Reviewed: 02/28/2009 [facility] Patient Information 2014 [place], [place]. Monilial Vaginitis Vaginitis in a soreness, swelling and redness (inflammation) of the vagina and vulva. Monilial vaginitis is not a sexually transmitted infection. CAUSES  Yeast vaginitis is caused by yeast (candida) that is normally found in your vagina. With a yeast infection, the candida has overgrown in number to a point that upsets the chemical balance. SYMPTOMS   White, thick vaginal discharge.  Swelling, itching, redness and irritation of the vagina and possibly the lips of the vagina (vulva).  Burning or painful urination.  Painful intercourse. DIAGNOSIS  Things that may contribute to monilial vaginitis are:  Postmenopausal and virginal states.  Pregnancy.  Infections.  Being tired, sick or  stressed, especially if you had monilial vaginitis in the past.  Diabetes. Good control will help lower the chance.  Birth control pills.  Tight fitting garments.  Using bubble bath, feminine sprays, douches or deodorant tampons.  Taking certain medications that kill germs (antibiotics).  Sporadic recurrence can occur if you become ill. TREATMENT  Your caregiver will give you medication.  There are several kinds of anti monilial vaginal creams and suppositories specific for monilial vaginitis. For recurrent yeast infections, use a suppository or cream in the vagina 2 times a week, or as directed.  Anti-monilial or steroid cream for the itching or irritation of the vulva may also be used. Get your caregiver's permission.  Painting the vagina with methylene blue solution may help if the monilial cream does not work.  Eating yogurt may help prevent monilial vaginitis. HOME CARE INSTRUCTIONS   Finish all medication as prescribed.  Do not have sex until treatment is completed or after your caregiver tells you it is okay.  Take warm sitz baths.  Do not douche.  Do not use tampons, especially scented ones.  Wear cotton underwear.  Avoid tight pants and panty hose.  Tell your sexual partner that you have a yeast infection. They should go to their caregiver if they have symptoms such as mild rash or itching.  Your sexual partner should be treated as well if your infection is difficult to eliminate.  Practice safer sex. Use condoms.  Some vaginal medications cause latex condoms to fail. Vaginal medications that harm condoms are:  Cleocin cream.  Butoconazole (Femstat).  Terconazole (Terazol) vaginal suppository.  Miconazole (Monistat) (may be purchased over the counter). SEEK MEDICAL CARE IF:   You have a temperature by mouth above 102 F (38.9 C).  The infection is getting worse after 2 days of treatment.  The infection is not getting better after 3 days of  treatment.  You develop blisters in or around your vagina.  You develop vaginal bleeding, and it is not your menstrual period.  You have pain when you urinate.  You develop intestinal problems.  You have pain with sexual intercourse. Document Released: 05/19/2005 Document Revised: 11/01/2011 Document Reviewed: 01/31/2009 [facility] Patient Information 2014 [place], [place]. Hypertension During Pregnancy Hypertension is also called high blood pressure. It can occur at any time in life and during pregnancy. When you have hypertension, there is extra pressure inside your blood vessels that carry blood from the heart to the rest of your body (arteries). Hypertension during pregnancy can cause problems for you and your baby. Your baby might not weigh as much as it should at birth or might be born early (premature). Very bad cases of hypertension during pregnancy can be life threatening.  Different types of hypertension can occur during pregnancy.   Chronic hypertension. This happens when a woman has hypertension before pregnancy and it continues during pregnancy.  Gestational hypertension. This is when hypertension develops during pregnancy.  Preeclampsia or toxemia of pregnancy. This is a very serious type of hypertension that develops only during pregnancy. It is a disease that affects the whole body (systemic) and can be very dangerous for both mother and baby.  Gestational hypertension and preeclampsia usually go away after your baby is born. Blood pressure generally stabilizes within 6 weeks. Women who have hypertension during pregnancy have a greater chance of developing hypertension later in life  or with future pregnancies. RISK FACTORS Some factors make you more likely to develop hypertension during pregnancy. Risk factors include:  Having hypertension before pregnancy.  Having hypertension during a previous pregnancy.  Being overweight.  Being older than 40.  Being pregnant with  more than one baby (multiples).  Having diabetes or kidney problems. SIGNS AND SYMPTOMS Chronic and gestational hypertension rarely cause symptoms. Preeclampsia has symptoms, which may include:  Increased protein in your urine. Your health care provider will check for this at every prenatal visit.  Swelling of your hands and face.  Rapid weight gain.  Headaches.  Visual changes.  Being bothered by light.  Abdominal pain, especially in the right upper area.  Chest pain.  Shortness of breath.  Increased reflexes.  Seizures. Seizures occur with a more severe form of preeclampsia, called eclampsia. DIAGNOSIS   You may be diagnosed with hypertension during a regular prenatal exam. At each visit, tests may include:  Blood pressure checks.  A urine test to check for protein in your urine.  The type of hypertension you are diagnosed with depends on when you developed it. It also depends on your specific blood pressure reading.  Developing hypertension before 20 weeks of pregnancy is consistent with chronic hypertension.  Developing hypertension after 20 weeks of pregnancy is consistent with gestational hypertension.  Hypertension with increased urinary protein is diagnosed as preeclampsia.  Blood pressure measurements that stay above 160 systolic or 110 diastolic are a sign of severe preeclampsia. TREATMENT Treatment for hypertension during pregnancy varies. Treatment depends on the type of hypertension and how serious it is.  If you take medicine for chronic hypertension, you may need to switch medicines.  Drugs called ACE inhibitors should not be taken during pregnancy.  Low-dose aspirin may be suggested for women who have risk factors for preeclampsia.  If you have gestational hypertension, you may need to take a blood pressure medicine that is safe during pregnancy. Your health care provider will recommend the appropriate medicine.  If you have severe preeclampsia,  you may need to be in the hospital. Health care providers will watch you and the baby very closely. You also may need to take medicine (magnesium sulfate) to prevent seizures and lower blood pressure.  Sometimes an early delivery is needed. This may be the case if the condition worsens. It would be done to protect you and the baby. The only cure for preeclampsia is delivery. HOME CARE INSTRUCTIONS  Schedule and keep all of your regular appointments for prenatal care.  Only take over-the-counter or prescription medicines as directed by your health care provider. Tell your health care provider about all medicines you take.  Eat as little salt as possible.  Get regular exercise.  Do not drink alcohol.  Do not use tobacco products.  Do not drink products with caffeine.  Lie on your left side when resting. SEEK IMMEDIATE MEDICAL CARE IF:  You have severe abdominal pain.  You have sudden swelling in the hands, ankles, or face.  You gain 4 pounds (1.8 kg) or more in 1 week.  You vomit repeatedly.  You have vaginal bleeding.  You do not feel the baby moving as much.  You have a headache.  You have blurred or double vision.  You have muscle twitching or spasms.  You have shortness of breath.  You have blue fingernails and lips.  You have blood in your urine. MAKE SURE YOU:  Understand these instructions.  Will watch your condition.  Will  get help right away if you are not doing well or get worse. Document Released: 04/27/2011 Document Revised: 05/30/2013 Document Reviewed: 03/08/2013 [facility] Patient Information 2014 [place], [place].

## 2013-12-24 NOTE — MAU Provider Note (Signed)
Chief Complaint:  Vaginal Bleeding and Abdominal Cramping   First Provider Initiated Contact with Patient 12/24/13 1803      HPI: Marilyn Norman is a 23 y.o. [id number] at 60w6dho presents to maternity admissions reporting cramping x5-6 hours ago, followed by bright red bleeding when wiping and in her underwear x2 hours ago.  She has hx of LTCS x2.  She reports good fetal movement, denies LOF, vaginal itching/burning, urinary symptoms, h/a, dizziness, n/v, or fever/chills.    Past Medical History: Past Medical History  Diagnosis Date  . Hypertension   . Chest pain   . Cardiomegaly   . Costochondritis   . Thyroid disease     hyperthyroid  . Hyperthyroidism   . Chlamydia   . Trichomonas infection     Past obstetric history: OB History  Gravida Para Term Preterm AB SAB TAB Ectopic Multiple Living  _0 # Outcome Date GA Lbr Len/2nd Weight Sex Delivery Anes PTL Lv  6 CUR           5 TRM     M LTCS   Y  4 TRM     F LTCS   Y  3 GRA              Comments: System Generated. Please review and update pregnancy details.  2 SAB           1 SAB               Past Surgical History: Past Surgical History  Procedure Laterality Date  . Cesarean section      Family History: Family History  Problem Relation Age of Onset  . Asthma Mother   . Hearing loss Father     Social History: History  Substance Use Topics  . Smoking status: Former SResearch scientist (life sciences) . Smokeless tobacco: Not on file  . Alcohol Use: No    Allergies:  Allergies  Allergen Reactions  . Strawberry Itching, Rash, Hives and Swelling    Meds:  No prescriptions prior to admission    ROS: Pertinent findings in history of present illness.  Physical Exam  Blood pressure 140/85, pulse 89, temperature 98.4 F (36.9 C), temperature source Oral, resp. rate 16, last menstrual period 12/28/2012, SpO2 100.00%, unknown if currently breastfeeding. GENERAL: Well-developed, well-nourished female in no acute  distress.  HEENT: normocephalic HEART: normal rate RESP: normal effort ABDOMEN: Soft, non-tender, gravid appropriate for gestational age EXTREMITIES: Nontender, no edema NEURO: alert and oriented Pelvic exam: Cervix pink, visually closed, with mild friability to cotton swab, large amount thick white discharge clinging to cervix and vaginal walls, vaginal walls and external genitalia normal  Cervix: 0/thick/high, posterior Exam by:: L Leftwich-Kirby CNM  FHT:  Baseline 145 , moderate variability, accelerations present, no decelerations Contractions: q 10-15 mins, mild to palpation   Labs:  Results for orders placed during the hospital encounter of 12/24/13 (from the past 48 hour(s))  URINALYSIS, ROUTINE W REFLEX MICROSCOPIC     Status: Abnormal   Collection Time    12/24/13  5:15 PM      Result Value Ref Range   Color, Urine YELLOW  YELLOW   APPearance CLEAR  CLEAR   Specific Gravity, Urine 1.010  1.005 - 1.030   pH 5.5  5.0 - 8.0   Glucose, UA NEGATIVE  NEGATIVE mg/dL   Hgb urine dipstick MODERATE (*) NEGATIVE   Bilirubin Urine NEGATIVE  NEGATIVE  Ketones, ur NEGATIVE  NEGATIVE mg/dL   Protein, ur NEGATIVE  NEGATIVE mg/dL   Urobilinogen, UA 0.2  0.0 - 1.0 mg/dL   Nitrite NEGATIVE  NEGATIVE   Leukocytes, UA MODERATE (*) NEGATIVE  URINE MICROSCOPIC-ADD ON     Status: Abnormal   Collection Time    12/24/13  5:15 PM      Result Value Ref Range   Squamous Epithelial / LPF FEW (*) RARE   WBC, UA 3-6  <3 WBC/hpf   RBC / HPF 3-6  <3 RBC/hpf   Bacteria, UA FEW (*) RARE   Urine-Other TRICHOMONAS PRESENT    WET PREP, GENITAL     Status: Abnormal   Collection Time    12/24/13  6:22 PM      Result Value Ref Range   Yeast Wet Prep HPF POC MANY (*) NONE SEEN   Trich, Wet Prep FEW (*) NONE SEEN   Clue Cells Wet Prep HPF POC NONE SEEN  NONE SEEN   WBC, Wet Prep HPF POC TOO NUMEROUS TO COUNT (*) NONE SEEN   Comment: BACTERIA- TOO NUMEROUS TO COUNT  GC/CHLAMYDIA PROBE AMP      Status: None   Collection Time    12/24/13  6:22 PM      Result Value Ref Range   CT Probe RNA NEGATIVE  NEGATIVE   GC Probe RNA NEGATIVE  NEGATIVE   Comment: (NOTE)                                                                                               **Normal Reference Range: Negative**          Assay performed using the Gen-Probe APTIMA COMBO2 (R) Assay.     Acceptable specimen types for this assay include APTIMA Swabs (Unisex,     endocervical, urethral, or vaginal), first void urine, and ThinPrep     liquid based cytology samples.     Performed at [facility]  CBC     Status: Abnormal   Collection Time    12/24/13  6:28 PM      Result Value Ref Range   WBC 13.2 (*) 4.0 - 10.5 K/uL   RBC 4.74  3.87 - 5.11 MIL/uL   Hemoglobin 11.6 (*) 12.0 - 15.0 g/dL   HCT 35.6 (*) 36.0 - 46.0 %   MCV 75.1 (*) 78.0 - 100.0 fL   MCH 24.5 (*) 26.0 - 34.0 pg   MCHC 32.6  30.0 - 36.0 g/dL   RDW 15.1  11.5 - 15.5 %   Platelets 279  150 - 400 K/uL  COMPREHENSIVE METABOLIC PANEL     Status: Abnormal   Collection Time    12/24/13  6:28 PM      Result Value Ref Range   Sodium 137  137 - 147 mEq/L   Potassium 4.6  3.7 - 5.3 mEq/L   Chloride 103  96 - 112 mEq/L   CO2 22  19 - 32 mEq/L   Glucose, Bld 88  70 - 99 mg/dL   BUN 10  6 - 23 mg/dL   Creatinine,  Ser 0.47 (*) 0.50 - 1.10 mg/dL   Calcium 9.8  8.4 - 10.5 mg/dL   Total Protein 6.6  6.0 - 8.3 g/dL   Albumin 2.6 (*) 3.5 - 5.2 g/dL   AST 11  0 - 37 U/L   ALT 14  0 - 35 U/L   Alkaline Phosphatase 251 (*) 39 - 117 U/L   Total Bilirubin 0.3  0.3 - 1.2 mg/dL   GFR calc non Af Amer >90  >90 mL/min   GFR calc Af Amer >90  >90 mL/min   Comment: (NOTE)     The eGFR has been calculated using the CKD EPI equation.     This calculation has not been validated in all clinical situations.     eGFR's persistently <90 mL/min signify possible Chronic Kidney     Disease.   Imaging:     Assessment: 1. Trichomonal vaginitis in  pregnancy in third trimester   2. Vaginal candidiasis   3. Chronic hypertension in obstetric context     Plan: Flagyl 2000 mg PO x1 dose in MAU Diflucan 150 mg now and 1 dose in 48 hours sent to pharmacy Discharge home with PTL precautions Recommend partner treatment for trichomonas F/U with Dr Ruthann Cancer as scheduled Return to MAU as needed       Follow-up Information   Follow up with MARSHALL,BERNARD A, MD. Call in 2 days. (For BP check)    Specialty:  Obstetrics and Gynecology   Contact information:   [place] [address] [phone]        Medication List         fluconazole 150 MG tablet  Commonly known as:  DIFLUCAN  Take 1 tablet 2 days after your dose in the MAU.     labetalol 200 MG tablet  Commonly known as:  NORMODYNE  Take 200 mg by mouth 3 (three) times daily.     methimazole 5 MG tablet  Commonly known as:  TAPAZOLE  Take 20 mg by mouth daily. Take 4 tablets once daily     prenatal multivitamin Tabs tablet  Take 1 tablet by mouth daily at 12 noon.        Fatima Blank Certified Nurse-Midwife 12/26/2013 5:39 AM

## 2013-12-25 LAB — OB RESULTS CONSOLE GBS: STREP GROUP B AG: POSITIVE

## 2013-12-25 LAB — GC/CHLAMYDIA PROBE AMP
CT PROBE, AMP APTIMA: NEGATIVE
GC Probe RNA: NEGATIVE

## 2013-12-26 ENCOUNTER — Ambulatory Visit (HOSPITAL_COMMUNITY): Payer: Medicaid Other

## 2013-12-27 ENCOUNTER — Ambulatory Visit (HOSPITAL_COMMUNITY)
Admit: 2013-12-27 | Discharge: 2013-12-27 | Disposition: A | Discharge status: Home / Self Care | Payer: Medicaid Other | Attending: Obstetrics | Admitting: Obstetrics

## 2013-12-27 DIAGNOSIS — O139 Gestational [pregnancy-induced] hypertension without significant proteinuria, unspecified trimester: Secondary | ICD-10-CM | POA: Insufficient documentation

## 2013-12-31 ENCOUNTER — Ambulatory Visit (HOSPITAL_COMMUNITY)
Admit: 2013-12-31 | Discharge: 2013-12-31 | Disposition: A | Discharge status: Home / Self Care | Payer: Medicaid Other | Attending: Obstetrics | Admitting: Obstetrics

## 2013-12-31 DIAGNOSIS — O34219 Maternal care for unspecified type scar from previous cesarean delivery: Secondary | ICD-10-CM | POA: Insufficient documentation

## 2013-12-31 DIAGNOSIS — IMO0002 Reserved for concepts with insufficient information to code with codable children: Secondary | ICD-10-CM

## 2013-12-31 DIAGNOSIS — O09299 Supervision of pregnancy with other poor reproductive or obstetric history, unspecified trimester: Secondary | ICD-10-CM | POA: Insufficient documentation

## 2013-12-31 DIAGNOSIS — E079 Disorder of thyroid, unspecified: Secondary | ICD-10-CM

## 2013-12-31 DIAGNOSIS — O99340 Other mental disorders complicating pregnancy, unspecified trimester: Secondary | ICD-10-CM | POA: Insufficient documentation

## 2013-12-31 DIAGNOSIS — O139 Gestational [pregnancy-induced] hypertension without significant proteinuria, unspecified trimester: Secondary | ICD-10-CM | POA: Insufficient documentation

## 2013-12-31 DIAGNOSIS — O99280 Endocrine, nutritional and metabolic diseases complicating pregnancy, unspecified trimester: Secondary | ICD-10-CM

## 2014-01-02 ENCOUNTER — Ambulatory Visit (HOSPITAL_COMMUNITY): Payer: Medicaid Other

## 2014-01-03 ENCOUNTER — Other Ambulatory Visit (HOSPITAL_COMMUNITY): Payer: Self-pay | Admitting: Obstetrics

## 2014-01-03 ENCOUNTER — Ambulatory Visit (HOSPITAL_COMMUNITY)
Admission: RE | Admit: 2014-01-03 | Discharge: 2014-01-03 | Disposition: A | Discharge status: Home / Self Care | Payer: Medicaid Other | Source: Ambulatory Visit | Attending: Obstetrics | Admitting: Obstetrics

## 2014-01-03 DIAGNOSIS — O34219 Maternal care for unspecified type scar from previous cesarean delivery: Secondary | ICD-10-CM | POA: Diagnosis not present

## 2014-01-03 DIAGNOSIS — O139 Gestational [pregnancy-induced] hypertension without significant proteinuria, unspecified trimester: Secondary | ICD-10-CM | POA: Insufficient documentation

## 2014-01-03 DIAGNOSIS — O99280 Endocrine, nutritional and metabolic diseases complicating pregnancy, unspecified trimester: Secondary | ICD-10-CM

## 2014-01-03 DIAGNOSIS — O09299 Supervision of pregnancy with other poor reproductive or obstetric history, unspecified trimester: Secondary | ICD-10-CM

## 2014-01-03 DIAGNOSIS — E079 Disorder of thyroid, unspecified: Secondary | ICD-10-CM

## 2014-01-03 DIAGNOSIS — IMO0002 Reserved for concepts with insufficient information to code with codable children: Secondary | ICD-10-CM

## 2014-01-07 ENCOUNTER — Ambulatory Visit (HOSPITAL_COMMUNITY): Payer: Medicaid Other

## 2014-01-07 ENCOUNTER — Ambulatory Visit (HOSPITAL_COMMUNITY): Payer: Medicaid Other | Attending: Obstetrics

## 2014-01-09 ENCOUNTER — Ambulatory Visit (HOSPITAL_COMMUNITY): Payer: Medicaid Other

## 2014-01-10 ENCOUNTER — Other Ambulatory Visit (HOSPITAL_COMMUNITY): Payer: Self-pay | Admitting: Obstetrics

## 2014-01-10 ENCOUNTER — Ambulatory Visit (HOSPITAL_COMMUNITY)
Admission: RE | Admit: 2014-01-10 | Discharge: 2014-01-10 | Disposition: A | Discharge status: Home / Self Care | Payer: Medicaid Other | Source: Ambulatory Visit | Attending: Obstetrics | Admitting: Obstetrics

## 2014-01-10 DIAGNOSIS — O34219 Maternal care for unspecified type scar from previous cesarean delivery: Secondary | ICD-10-CM

## 2014-01-10 DIAGNOSIS — E079 Disorder of thyroid, unspecified: Secondary | ICD-10-CM

## 2014-01-10 DIAGNOSIS — O09299 Supervision of pregnancy with other poor reproductive or obstetric history, unspecified trimester: Secondary | ICD-10-CM

## 2014-01-10 DIAGNOSIS — O139 Gestational [pregnancy-induced] hypertension without significant proteinuria, unspecified trimester: Secondary | ICD-10-CM | POA: Insufficient documentation

## 2014-01-10 DIAGNOSIS — O99280 Endocrine, nutritional and metabolic diseases complicating pregnancy, unspecified trimester: Secondary | ICD-10-CM

## 2014-01-10 DIAGNOSIS — IMO0002 Reserved for concepts with insufficient information to code with codable children: Secondary | ICD-10-CM

## 2014-01-10 DIAGNOSIS — O99340 Other mental disorders complicating pregnancy, unspecified trimester: Secondary | ICD-10-CM | POA: Insufficient documentation

## 2014-01-11 ENCOUNTER — Other Ambulatory Visit (HOSPITAL_COMMUNITY): Payer: Self-pay | Admitting: *Deleted

## 2014-01-11 DIAGNOSIS — I10 Essential (primary) hypertension: Secondary | ICD-10-CM

## 2014-01-16 ENCOUNTER — Telehealth (HOSPITAL_COMMUNITY): Payer: Self-pay | Admitting: *Deleted

## 2014-01-16 ENCOUNTER — Encounter (HOSPITAL_COMMUNITY): Payer: Self-pay | Admitting: *Deleted

## 2014-01-16 NOTE — Telephone Encounter (Signed)
Preadmission screen  

## 2014-01-18 ENCOUNTER — Encounter (HOSPITAL_COMMUNITY): Payer: Self-pay | Admitting: Pharmacist

## 2014-01-18 ENCOUNTER — Other Ambulatory Visit: Payer: Self-pay | Admitting: Obstetrics

## 2014-01-18 NOTE — H&P (Signed)
NAMEKEIMARI, POINTER NO.:  [id number]  MEDICAL RECORD NO.:  [id number]  LOCATION:                                 FACILITY:  PHYSICIAN:  Kathreen Cosier, M.D.DATE OF BIRTH:  1991/03/10  DATE OF ADMISSION:  01/22/2014 DATE OF DISCHARGE:                             HISTORY & PHYSICAL   HISTORY OF PRESENT ILLNESS:  The patient is a 23 year old gravida 4, para 2-0-1-2.  Her EDC is January 29, 2014 and she has chronic hypertension during this pregnancy followed by MFN.  She is on labetalol 400 mg q.i.d.  She is also hyperthyroid and she is on methimazole 10 mg t.i.d. She is in for repeat C-section on January 22, 2014, and also tubal ligation. The patient is multiparous and wants tubal ligation.  PAST MEDICAL HISTORY:  Chronic hypertension, preeclampsia with all or previous pregnancies.  PAST SURGICAL HISTORY:  C-section x2.  SOCIAL HISTORY:  Negative.  SYSTEM REVIEW:  Noncontributory.  PHYSICAL EXAMINATION:  GENERAL:  Well-developed female, in no distress. HEENT:  Negative. BREASTS:  Negative. LUNGS:  Clear to P and A. HEART:  Regular rhythm.  No murmurs, no gallops. ABDOMEN:  Term. PELVIC:  Deferred. EXTREMITIES:  Negative.   DICTATION ENDED AT THIS POINT.          ______________________________ Kathreen Cosier, M.D.     BAM/MEDQ  D:  01/17/2014  T:  01/18/2014  Job:  [id number]

## 2014-01-21 ENCOUNTER — Encounter (HOSPITAL_COMMUNITY): Payer: Self-pay

## 2014-01-21 ENCOUNTER — Encounter (HOSPITAL_COMMUNITY)
Admission: RE | Admit: 2014-01-21 | Discharge: 2014-01-21 | Disposition: A | Discharge status: Home / Self Care | Payer: Medicaid Other | Source: Ambulatory Visit | Attending: Obstetrics | Admitting: Obstetrics

## 2014-01-21 LAB — BASIC METABOLIC PANEL
BUN: 7 mg/dL (ref 6–23)
CHLORIDE: 105 meq/L (ref 96–112)
CO2: 22 mEq/L (ref 19–32)
CREATININE: 0.55 mg/dL (ref 0.50–1.10)
Calcium: 8.8 mg/dL (ref 8.4–10.5)
Glucose, Bld: 81 mg/dL (ref 70–99)
Potassium: 4.1 mEq/L (ref 3.7–5.3)
Sodium: 139 mEq/L (ref 137–147)

## 2014-01-21 LAB — TYPE AND SCREEN
ABO/RH(D): O POS
ANTIBODY SCREEN: NEGATIVE

## 2014-01-21 LAB — CBC
HEMATOCRIT: 33.6 % — AB (ref 36.0–46.0)
HEMOGLOBIN: 11.1 g/dL — AB (ref 12.0–15.0)
MCH: 24.9 pg — ABNORMAL LOW (ref 26.0–34.0)
MCHC: 33 g/dL (ref 30.0–36.0)
MCV: 75.3 fL — ABNORMAL LOW (ref 78.0–100.0)
Platelets: 247 10*3/uL (ref 150–400)
RBC: 4.46 MIL/uL (ref 3.87–5.11)
RDW: 16.7 % — AB (ref 11.5–15.5)
WBC: 9.4 10*3/uL (ref 4.0–10.5)

## 2014-01-21 LAB — RPR

## 2014-01-21 MED ORDER — METHIMAZOLE 10 MG PO TABS
10.0000 mg | ORAL_TABLET | Freq: Three times a day (TID) | ORAL | Status: DC
  Filled 2014-01-21 (×7): qty 1

## 2014-01-21 MED ORDER — LABETALOL HCL 200 MG PO TABS
200.0000 mg | ORAL_TABLET | Freq: Four times a day (QID) | ORAL | Status: AC
  Filled 2014-01-21 (×2): qty 1

## 2014-01-21 NOTE — Pre-Procedure Instructions (Signed)
Cardiac history and testing results reviewed by Dr. Malen Gauze, no orders given.

## 2014-01-21 NOTE — Patient Instructions (Signed)
20 OLANNA SCHWERDT  01/21/2014   Your procedure is scheduled on:  01/22/14  Enter through the Main Entrance of [facility] at 10 AM.  Pick up the phone at the desk and dial [phone].   Call this number if you have problems the morning of surgery: [phone]   Remember:   Do not eat food:After Midnight.  Do not drink clear liquids: After Midnight.  Take these medicines the morning of surgery with A SIP OF WATER: take blood pressure medication and thyroid med.   Do not wear jewelry, make-up or nail polish.  Do not wear lotions, powders, or perfumes. You may wear deodorant.  Do not shave 48 hours prior to surgery.  Do not bring valuables to the hospital.  [facility] is not   responsible for any belongings or valuables brought to the hospital.  Contacts, dentures or bridgework may not be worn into surgery.  Leave suitcase in the car. After surgery it may be brought to your room.  For patients admitted to the hospital, checkout time is 11:00 AM the day of              discharge.   Patients discharged the day of surgery will not be allowed to drive             home.  Name and phone number of your driver: NA  Special Instructions:      Please read over the following fact sheets that you were given:   Surgical Site Infection Prevention

## 2014-01-22 ENCOUNTER — Inpatient Hospital Stay (HOSPITAL_COMMUNITY): Payer: Medicaid Other | Admitting: Anesthesiology

## 2014-01-22 ENCOUNTER — Inpatient Hospital Stay (HOSPITAL_COMMUNITY): Admission: RE | Admit: 2014-01-22 | Payer: Medicaid Other | Source: Ambulatory Visit

## 2014-01-22 ENCOUNTER — Encounter (HOSPITAL_COMMUNITY): Payer: Medicaid Other | Admitting: Anesthesiology

## 2014-01-22 ENCOUNTER — Inpatient Hospital Stay (HOSPITAL_COMMUNITY)
Admission: RE | Admit: 2014-01-22 | Discharge: 2014-01-25 | DRG: 766 | Disposition: A | Discharge status: Home / Self Care | Payer: Medicaid Other | Source: Ambulatory Visit | Attending: Obstetrics | Admitting: Obstetrics

## 2014-01-22 ENCOUNTER — Encounter (HOSPITAL_COMMUNITY): Payer: Self-pay | Admitting: Anesthesiology

## 2014-01-22 ENCOUNTER — Encounter (HOSPITAL_COMMUNITY)
Admission: RE | Disposition: A | Discharge status: Home / Self Care | Payer: Self-pay | Source: Ambulatory Visit | Attending: Obstetrics

## 2014-01-22 DIAGNOSIS — Z302 Encounter for sterilization: Secondary | ICD-10-CM

## 2014-01-22 DIAGNOSIS — Z98891 History of uterine scar from previous surgery: Secondary | ICD-10-CM

## 2014-01-22 DIAGNOSIS — O34219 Maternal care for unspecified type scar from previous cesarean delivery: Secondary | ICD-10-CM | POA: Diagnosis present

## 2014-01-22 DIAGNOSIS — D649 Anemia, unspecified: Secondary | ICD-10-CM | POA: Diagnosis not present

## 2014-01-22 DIAGNOSIS — O1002 Pre-existing essential hypertension complicating childbirth: Secondary | ICD-10-CM | POA: Diagnosis present

## 2014-01-22 DIAGNOSIS — E0590 Thyrotoxicosis, unspecified without thyrotoxic crisis or storm: Secondary | ICD-10-CM | POA: Diagnosis present

## 2014-01-22 DIAGNOSIS — O9903 Anemia complicating the puerperium: Secondary | ICD-10-CM | POA: Diagnosis not present

## 2014-01-22 DIAGNOSIS — Z22330 Carrier of Group B streptococcus: Secondary | ICD-10-CM

## 2014-01-22 DIAGNOSIS — Z87891 Personal history of nicotine dependence: Secondary | ICD-10-CM | POA: Diagnosis not present

## 2014-01-22 DIAGNOSIS — E079 Disorder of thyroid, unspecified: Secondary | ICD-10-CM | POA: Diagnosis present

## 2014-01-22 DIAGNOSIS — O99284 Endocrine, nutritional and metabolic diseases complicating childbirth: Secondary | ICD-10-CM

## 2014-01-22 DIAGNOSIS — O9989 Other specified diseases and conditions complicating pregnancy, childbirth and the puerperium: Secondary | ICD-10-CM

## 2014-01-22 DIAGNOSIS — O0940 Supervision of pregnancy with grand multiparity, unspecified trimester: Secondary | ICD-10-CM

## 2014-01-22 DIAGNOSIS — O10019 Pre-existing essential hypertension complicating pregnancy, unspecified trimester: Secondary | ICD-10-CM | POA: Diagnosis present

## 2014-01-22 DIAGNOSIS — O99892 Other specified diseases and conditions complicating childbirth: Secondary | ICD-10-CM | POA: Diagnosis present

## 2014-01-22 SURGERY — Surgical Case
Anesthesia: Spinal

## 2014-01-22 MED ORDER — IBUPROFEN 600 MG PO TABS
600.0000 mg | ORAL_TABLET | Freq: Four times a day (QID) | ORAL | Status: DC
  Administered 2014-01-22 – 2014-01-25 (×10): 600 mg via ORAL
  Filled 2014-01-22 (×10): qty 1

## 2014-01-22 MED ORDER — LACTATED RINGERS IV SOLN: INTRAVENOUS | Status: DC

## 2014-01-22 MED ORDER — MORPHINE SULFATE (PF) 0.5 MG/ML IJ SOLN
INTRAMUSCULAR | Status: DC | PRN
  Administered 2014-01-22: .2 mg via INTRATHECAL

## 2014-01-22 MED ORDER — ONDANSETRON HCL 4 MG PO TABS: 4.0000 mg | ORAL_TABLET | ORAL | Status: DC | PRN

## 2014-01-22 MED ORDER — SIMETHICONE 80 MG PO CHEW
80.0000 mg | CHEWABLE_TABLET | Freq: Three times a day (TID) | ORAL | Status: DC
  Administered 2014-01-22 – 2014-01-24 (×7): 80 mg via ORAL
  Filled 2014-01-22 (×7): qty 1

## 2014-01-22 MED ORDER — SIMETHICONE 80 MG PO CHEW
80.0000 mg | CHEWABLE_TABLET | ORAL | Status: DC
  Administered 2014-01-22 – 2014-01-24 (×3): 80 mg via ORAL
  Filled 2014-01-22 (×3): qty 1

## 2014-01-22 MED ORDER — SCOPOLAMINE 1 MG/3DAYS TD PT72
MEDICATED_PATCH | TRANSDERMAL | Status: AC
  Administered 2014-01-22: 1.5 mg via TRANSDERMAL
  Filled 2014-01-22: qty 1

## 2014-01-22 MED ORDER — NALOXONE HCL 0.4 MG/ML IJ SOLN: 0.4000 mg | INTRAMUSCULAR | Status: DC | PRN

## 2014-01-22 MED ORDER — ONDANSETRON HCL 4 MG/2ML IJ SOLN
INTRAMUSCULAR | Status: AC
  Filled 2014-01-22: qty 2

## 2014-01-22 MED ORDER — LABETALOL HCL 200 MG PO TABS
200.0000 mg | ORAL_TABLET | Freq: Three times a day (TID) | ORAL | Status: DC
  Administered 2014-01-22 – 2014-01-25 (×8): 200 mg via ORAL
  Filled 2014-01-22 (×9): qty 1

## 2014-01-22 MED ORDER — FENTANYL CITRATE 0.05 MG/ML IJ SOLN
INTRAMUSCULAR | Status: AC
  Filled 2014-01-22: qty 2

## 2014-01-22 MED ORDER — OXYTOCIN 10 UNIT/ML IJ SOLN
INTRAMUSCULAR | Status: AC
  Filled 2014-01-22: qty 4

## 2014-01-22 MED ORDER — LACTATED RINGERS IV SOLN
INTRAVENOUS | Status: DC
  Administered 2014-01-22: 19:00:00 via INTRAVENOUS

## 2014-01-22 MED ORDER — OXYTOCIN 40 UNITS IN LACTATED RINGERS INFUSION - SIMPLE MED: 62.5000 mL/h | INTRAVENOUS | Status: AC

## 2014-01-22 MED ORDER — LACTATED RINGERS IV SOLN
40.0000 [IU] | INTRAVENOUS | Status: DC | PRN
  Administered 2014-01-22: 40 [IU] via INTRAVENOUS

## 2014-01-22 MED ORDER — PROMETHAZINE HCL 25 MG/ML IJ SOLN: 6.2500 mg | INTRAMUSCULAR | Status: DC | PRN

## 2014-01-22 MED ORDER — ACETAMINOPHEN 325 MG PO TABS
650.0000 mg | ORAL_TABLET | ORAL | Status: DC | PRN
  Administered 2014-01-22: 650 mg via ORAL
  Filled 2014-01-22: qty 2

## 2014-01-22 MED ORDER — ONDANSETRON HCL 4 MG/2ML IJ SOLN
INTRAMUSCULAR | Status: DC | PRN
Start: 2014-01-22 — End: 2014-01-22
  Administered 2014-01-22: 4 mg via INTRAVENOUS

## 2014-01-22 MED ORDER — DIPHENHYDRAMINE HCL 50 MG/ML IJ SOLN: 25.0000 mg | INTRAMUSCULAR | Status: DC | PRN

## 2014-01-22 MED ORDER — DIBUCAINE 1 % RE OINT: 1.0000 "application " | TOPICAL_OINTMENT | RECTAL | Status: DC | PRN

## 2014-01-22 MED ORDER — CEFAZOLIN SODIUM-DEXTROSE 2-3 GM-% IV SOLR
INTRAVENOUS | Status: AC
  Filled 2014-01-22: qty 50

## 2014-01-22 MED ORDER — NALBUPHINE HCL 10 MG/ML IJ SOLN: 5.0000 mg | INTRAMUSCULAR | Status: DC | PRN

## 2014-01-22 MED ORDER — ZOLPIDEM TARTRATE 5 MG PO TABS: 5.0000 mg | ORAL_TABLET | Freq: Every evening | ORAL | Status: DC | PRN

## 2014-01-22 MED ORDER — KETOROLAC TROMETHAMINE 30 MG/ML IJ SOLN
30.0000 mg | Freq: Four times a day (QID) | INTRAMUSCULAR | Status: AC | PRN
  Filled 2014-01-22: qty 1

## 2014-01-22 MED ORDER — SCOPOLAMINE 1 MG/3DAYS TD PT72
1.0000 | MEDICATED_PATCH | Freq: Once | TRANSDERMAL | Status: DC
Start: 2014-01-22 — End: 2014-01-22
  Administered 2014-01-22: 1.5 mg via TRANSDERMAL

## 2014-01-22 MED ORDER — MEPERIDINE HCL 25 MG/ML IJ SOLN: 6.2500 mg | INTRAMUSCULAR | Status: DC | PRN

## 2014-01-22 MED ORDER — WITCH HAZEL-GLYCERIN EX PADS: 1.0000 "application " | MEDICATED_PAD | CUTANEOUS | Status: DC | PRN

## 2014-01-22 MED ORDER — SODIUM CHLORIDE 0.9 % IJ SOLN: 3.0000 mL | INTRAMUSCULAR | Status: DC | PRN

## 2014-01-22 MED ORDER — LACTATED RINGERS IV SOLN
Freq: Once | INTRAVENOUS | Status: AC
  Administered 2014-01-22: 11:00:00 via INTRAVENOUS

## 2014-01-22 MED ORDER — CEFAZOLIN SODIUM-DEXTROSE 2-3 GM-% IV SOLR
2.0000 g | Freq: Once | INTRAVENOUS | Status: AC
  Administered 2014-01-22: 2 g via INTRAVENOUS

## 2014-01-22 MED ORDER — PHENYLEPHRINE 8 MG IN D5W 100 ML (0.08MG/ML) PREMIX OPTIME
INJECTION | INTRAVENOUS | Status: DC | PRN
  Administered 2014-01-22: 30 ug/min via INTRAVENOUS

## 2014-01-22 MED ORDER — TETANUS-DIPHTH-ACELL PERTUSSIS 5-2.5-18.5 LF-MCG/0.5 IM SUSP
0.5000 mL | Freq: Once | INTRAMUSCULAR | Status: AC
  Administered 2014-01-23: 0.5 mL via INTRAMUSCULAR
  Filled 2014-01-22: qty 0.5

## 2014-01-22 MED ORDER — PHENYLEPHRINE 8 MG IN D5W 100 ML (0.08MG/ML) PREMIX OPTIME
INJECTION | INTRAVENOUS | Status: AC
  Filled 2014-01-22: qty 100

## 2014-01-22 MED ORDER — DIPHENHYDRAMINE HCL 25 MG PO CAPS: 25.0000 mg | ORAL_CAPSULE | ORAL | Status: DC | PRN

## 2014-01-22 MED ORDER — IBUPROFEN 600 MG PO TABS: 600.0000 mg | ORAL_TABLET | Freq: Four times a day (QID) | ORAL | Status: DC | PRN

## 2014-01-22 MED ORDER — LANOLIN HYDROUS EX OINT: 1.0000 "application " | TOPICAL_OINTMENT | CUTANEOUS | Status: DC | PRN

## 2014-01-22 MED ORDER — KETOROLAC TROMETHAMINE 30 MG/ML IJ SOLN
INTRAMUSCULAR | Status: AC
  Filled 2014-01-22: qty 1

## 2014-01-22 MED ORDER — PRENATAL MULTIVITAMIN CH
1.0000 | ORAL_TABLET | Freq: Every day | ORAL | Status: DC
  Administered 2014-01-23 – 2014-01-24 (×2): 1 via ORAL
  Filled 2014-01-22 (×2): qty 1

## 2014-01-22 MED ORDER — SENNOSIDES-DOCUSATE SODIUM 8.6-50 MG PO TABS
2.0000 | ORAL_TABLET | ORAL | Status: DC
  Administered 2014-01-22 – 2014-01-24 (×3): 2 via ORAL
  Filled 2014-01-22 (×3): qty 2

## 2014-01-22 MED ORDER — KETOROLAC TROMETHAMINE 30 MG/ML IJ SOLN
30.0000 mg | Freq: Four times a day (QID) | INTRAMUSCULAR | Status: AC | PRN
  Administered 2014-01-22: 30 mg via INTRAMUSCULAR

## 2014-01-22 MED ORDER — LACTATED RINGERS IV SOLN
INTRAVENOUS | Status: DC
  Administered 2014-01-22 (×3): via INTRAVENOUS

## 2014-01-22 MED ORDER — MENTHOL 3 MG MT LOZG: 1.0000 | LOZENGE | OROMUCOSAL | Status: DC | PRN

## 2014-01-22 MED ORDER — HYDROMORPHONE HCL PF 1 MG/ML IJ SOLN: 0.2500 mg | INTRAMUSCULAR | Status: DC | PRN

## 2014-01-22 MED ORDER — BUPIVACAINE IN DEXTROSE 0.75-8.25 % IT SOLN
INTRATHECAL | Status: DC | PRN
  Administered 2014-01-22: 1.6 mL via INTRATHECAL

## 2014-01-22 MED ORDER — DIPHENHYDRAMINE HCL 50 MG/ML IJ SOLN: 12.5000 mg | INTRAMUSCULAR | Status: DC | PRN

## 2014-01-22 MED ORDER — FENTANYL CITRATE 0.05 MG/ML IJ SOLN
INTRAMUSCULAR | Status: DC | PRN
Start: 2014-01-22 — End: 2014-01-22
  Administered 2014-01-22: 12.5 ug via INTRATHECAL

## 2014-01-22 MED ORDER — KETOROLAC TROMETHAMINE 30 MG/ML IJ SOLN: 15.0000 mg | Freq: Once | INTRAMUSCULAR | Status: DC | PRN

## 2014-01-22 MED ORDER — METOCLOPRAMIDE HCL 5 MG/ML IJ SOLN: 10.0000 mg | Freq: Three times a day (TID) | INTRAMUSCULAR | Status: DC | PRN

## 2014-01-22 MED ORDER — MORPHINE SULFATE 0.5 MG/ML IJ SOLN
INTRAMUSCULAR | Status: AC
  Filled 2014-01-22: qty 10

## 2014-01-22 MED ORDER — NALBUPHINE HCL 10 MG/ML IJ SOLN
5.0000 mg | INTRAMUSCULAR | Status: DC | PRN
  Administered 2014-01-22: 10 mg via INTRAVENOUS
  Filled 2014-01-22: qty 1

## 2014-01-22 MED ORDER — NALOXONE HCL 1 MG/ML IJ SOLN: 1.0000 ug/kg/h | INTRAVENOUS | Status: DC | PRN

## 2014-01-22 MED ORDER — DIPHENHYDRAMINE HCL 25 MG PO CAPS: 25.0000 mg | ORAL_CAPSULE | Freq: Four times a day (QID) | ORAL | Status: DC | PRN

## 2014-01-22 MED ORDER — ONDANSETRON HCL 4 MG/2ML IJ SOLN: 4.0000 mg | INTRAMUSCULAR | Status: DC | PRN

## 2014-01-22 MED ORDER — SIMETHICONE 80 MG PO CHEW: 80.0000 mg | CHEWABLE_TABLET | ORAL | Status: DC | PRN

## 2014-01-22 MED ORDER — OXYCODONE-ACETAMINOPHEN 5-325 MG PO TABS
1.0000 | ORAL_TABLET | ORAL | Status: DC | PRN
  Administered 2014-01-23 (×3): 2 via ORAL
  Administered 2014-01-24 – 2014-01-25 (×5): 1 via ORAL
  Filled 2014-01-22: qty 1
  Filled 2014-01-22 (×3): qty 2
  Filled 2014-01-22 (×5): qty 1

## 2014-01-22 MED ORDER — ONDANSETRON HCL 4 MG/2ML IJ SOLN: 4.0000 mg | Freq: Three times a day (TID) | INTRAMUSCULAR | Status: DC | PRN

## 2014-01-22 MED ORDER — METHIMAZOLE 10 MG PO TABS
10.0000 mg | ORAL_TABLET | Freq: Three times a day (TID) | ORAL | Status: DC
  Administered 2014-01-22 – 2014-01-25 (×8): 10 mg via ORAL
  Filled 2014-01-22 (×9): qty 1

## 2014-01-22 SURGICAL SUPPLY — 34 items
CLAMP CORD UMBIL (MISCELLANEOUS) IMPLANT
CLOTH BEACON ORANGE TIMEOUT ST (SAFETY) ×3 IMPLANT
CONTAINER PREFILL 10% NBF 15ML (MISCELLANEOUS) ×6 IMPLANT
DERMABOND ADVANCED (GAUZE/BANDAGES/DRESSINGS) ×2
DERMABOND ADVANCED .7 DNX12 (GAUZE/BANDAGES/DRESSINGS) ×1 IMPLANT
DRAPE LG THREE QUARTER DISP (DRAPES) IMPLANT
DRSG OPSITE POSTOP 4X10 (GAUZE/BANDAGES/DRESSINGS) ×3 IMPLANT
DURAPREP 26ML APPLICATOR (WOUND CARE) ×3 IMPLANT
ELECT REM PT RETURN 9FT ADLT (ELECTROSURGICAL) ×3
ELECTRODE REM PT RTRN 9FT ADLT (ELECTROSURGICAL) ×1 IMPLANT
EXTRACTOR VACUUM M CUP 4 TUBE (SUCTIONS) IMPLANT
EXTRACTOR VACUUM M CUP 4' TUBE (SUCTIONS)
GLOVE BIO SURGEON STRL SZ8.5 (GLOVE) ×3 IMPLANT
GOWN STRL REUS W/TWL 2XL LVL3 (GOWN DISPOSABLE) ×3 IMPLANT
GOWN STRL REUS W/TWL LRG LVL3 (GOWN DISPOSABLE) ×3 IMPLANT
KIT ABG SYR 3ML LUER SLIP (SYRINGE) IMPLANT
NEEDLE HYPO 25X5/8 SAFETYGLIDE (NEEDLE) IMPLANT
NS IRRIG 1000ML POUR BTL (IV SOLUTION) ×3 IMPLANT
PACK C SECTION WH (CUSTOM PROCEDURE TRAY) ×3 IMPLANT
PAD ABD 7.5X8 STRL (GAUZE/BANDAGES/DRESSINGS) ×3 IMPLANT
PAD OB MATERNITY 4.3X12.25 (PERSONAL CARE ITEMS) ×3 IMPLANT
SPONGE GAUZE 4X4 12PLY (GAUZE/BANDAGES/DRESSINGS) ×3 IMPLANT
SUT CHROMIC 0 CT 802H (SUTURE) ×3 IMPLANT
SUT CHROMIC 1 CTX 36 (SUTURE) ×6 IMPLANT
SUT CHROMIC 2 0 SH (SUTURE) ×3 IMPLANT
SUT GUT PLAIN 0 CT-3 TAN 27 (SUTURE) ×3 IMPLANT
SUT MON AB 4-0 PS1 27 (SUTURE) ×3 IMPLANT
SUT VIC AB 0 CT1 18XCR BRD8 (SUTURE) IMPLANT
SUT VIC AB 0 CT1 8-18 (SUTURE)
SUT VIC AB 0 CTX 36 (SUTURE) ×4
SUT VIC AB 0 CTX36XBRD ANBCTRL (SUTURE) ×2 IMPLANT
TOWEL OR 17X24 6PK STRL BLUE (TOWEL DISPOSABLE) ×3 IMPLANT
TRAY FOLEY CATH 14FR (SET/KITS/TRAYS/PACK) ×3 IMPLANT
WATER STERILE IRR 1000ML POUR (IV SOLUTION) IMPLANT

## 2014-01-22 NOTE — Anesthesia Postprocedure Evaluation (Signed)
  Anesthesia Post-op Note  Patient: Marilyn Norman  Procedure(s) Performed: Procedure(s): REPEAT CESAREAN SECTION WITH BILATERAL TUBAL LIGATION (N/A)  Patient Location: PACU  Anesthesia Type:Spinal  Level of Consciousness: awake, alert  and oriented  Airway and Oxygen Therapy: Patient Spontanous Breathing  Post-op Pain: none  Post-op Assessment: Post-op Vital signs reviewed, Patient's Cardiovascular Status Stable, Respiratory Function Stable, Patent Airway, No signs of Nausea or vomiting, Pain level controlled, No headache and No backache  Post-op Vital Signs: Reviewed and stable  Last Vitals:  Filed Vitals:   01/22/14 1400  BP: 139/57  Pulse: 82  Temp:   Resp: 22    Complications: No apparent anesthesia complications

## 2014-01-22 NOTE — Anesthesia Preprocedure Evaluation (Signed)
Anesthesia Evaluation  Patient identified by MRN, date of birth, ID band Patient awake    Reviewed: Allergy & Precautions, H&P , NPO status , Patient's Chart, lab work & pertinent test results  Airway Mallampati: II TM Distance: >3 FB Neck ROM: full    Dental no notable dental hx.    Pulmonary neg pulmonary ROS, former smoker,    Pulmonary exam normal       Cardiovascular hypertension, Pt. on medications and Pt. on home beta blockers     Neuro/Psych negative neurological ROS  negative psych ROS   GI/Hepatic negative GI ROS, Neg liver ROS,   Endo/Other  Hyperthyroidism   Renal/GU negative Renal ROS     Musculoskeletal   Abdominal Normal abdominal exam  (+)   Peds  Hematology negative hematology ROS (+)   Anesthesia Other Findings   Reproductive/Obstetrics (+) Pregnancy                           Anesthesia Physical Anesthesia Plan  ASA: II  Anesthesia Plan: Spinal   Post-op Pain Management:    Induction:   Airway Management Planned:   Additional Equipment:   Intra-op Plan:   Post-operative Plan:   Informed Consent: I have reviewed the patients History and Physical, chart, labs and discussed the procedure including the risks, benefits and alternatives for the proposed anesthesia with the patient or authorized representative who has indicated his/her understanding and acceptance.     Plan Discussed with: CRNA and Surgeon  Anesthesia Plan Comments:         Anesthesia Quick Evaluation

## 2014-01-22 NOTE — Op Note (Signed)
Preop diagnosis previous cesarean section x2 who at 39 weeks multiparity chronic hypertension hyperthyroidism Postop diagnosis repeat low transverse cesarean section and tubal ligation Surgeon Dr. Francoise Ceo First assistant Dr. Coral Ceo Anesthesia Acedia patient placed on the operating table in the supine position abdomen prepped and draped bladder emptied with a Foley catheter a transverse incision made through the old scar carried   to the rectus fascia fascia cleaned and incised the length of the incision recti muscles retracted laterally peritoneum incised longitudinally transverse incision made on the visceroperitoneum above the bladder and the bladder mobilized inferiorly transverse low uterine incision made and the fluid was clear   a female Apgar 8 and 9 team in attendance the placenta was fundal removed manually and the uterine cavity clean with dry laps the uterine incision was closed in one layer with continuous suture of #1 chromic lap and sponge counts correct hemostasis satisfactory the right tube was grasped in the midportion with a Babcock clamp 0 plain suture placed in the mesosalpinx below the portion of tube within the clamp and this was tied and approximately 1 inch of tube transected procedure was done in a similar fashion on the other side lap and sponge counts correct abdomen closed in layers peritoneum continuous with 2 0-0 chromic fascia continuous suture of 0 Vicryl and the skin  With  a subcuticular stitch of 4-0 Monocryl blood loss was 500 cc

## 2014-01-22 NOTE — H&P (Signed)
  There has been no change in the history and physical since the origin of dictation

## 2014-01-22 NOTE — Anesthesia Procedure Notes (Signed)
Spinal  Patient location during procedure: OR Start time: 01/22/2014 11:32 AM End time: 01/22/2014 11:35 AM Staffing Anesthesiologist: Leilani Able Performed by: anesthesiologist  Preanesthetic Checklist Completed: patient identified, surgical consent, pre-op evaluation, timeout performed, IV checked, risks and benefits discussed and monitors and equipment checked Spinal Block Patient position: sitting Prep: DuraPrep Patient monitoring: heart rate, cardiac monitor, continuous pulse ox and blood pressure Approach: midline Location: L3-4 Injection technique: single-shot Needle Needle type: Pencan  Needle gauge: 24 G Needle length: 9 cm Needle insertion depth: 5 cm Assessment Sensory level: T4

## 2014-01-22 NOTE — Transfer of Care (Signed)
Immediate Anesthesia Transfer of Care Note  Patient: Marilyn Norman  Procedure(s) Performed: Procedure(s): REPEAT CESAREAN SECTION WITH BILATERAL TUBAL LIGATION (N/A)  Patient Location: PACU  Anesthesia Type:Spinal  Level of Consciousness: awake, alert  and oriented  Airway & Oxygen Therapy: Patient Spontanous Breathing  Post-op Assessment: Report given to PACU RN and Post -op Vital signs reviewed and stable  Post vital signs: Reviewed and stable  Complications: No apparent anesthesia complications

## 2014-01-22 NOTE — Consult Note (Signed)
Neonatology Note:   Attendance at C-section:    I was asked by Dr. Gaynell Face to attend this repeat C/S at term. The mother is a W4X3K4 O pos, GBS pos with hyperthyroidism and hypertension, on Labetalol. ROM at delivery, fluid clear. Infant with irregular respirations initially, but responded well to stimulation. Needed bulb suctioning. Ap 8/9. Lungs clear to ausc in DR. Pulse oximeter placed on baby by CN nurse at about 7 minutes, reading 68% in room air. BBO2 was given for 2 minutes, with increase in O2 saturation to high 90s. He maintained normal saturations after being weaned to room air. I spoke with his parents in the DR. To CN to care of Pediatrician.   Doretha Sou, MD

## 2014-01-23 ENCOUNTER — Encounter (HOSPITAL_COMMUNITY): Payer: Self-pay | Admitting: Obstetrics

## 2014-01-23 LAB — CBC
HEMATOCRIT: 25.8 % — AB (ref 36.0–46.0)
HEMOGLOBIN: 8.6 g/dL — AB (ref 12.0–15.0)
MCH: 25.2 pg — ABNORMAL LOW (ref 26.0–34.0)
MCHC: 33.3 g/dL (ref 30.0–36.0)
MCV: 75.7 fL — ABNORMAL LOW (ref 78.0–100.0)
Platelets: 149 10*3/uL — ABNORMAL LOW (ref 150–400)
RBC: 3.41 MIL/uL — ABNORMAL LOW (ref 3.87–5.11)
RDW: 17.3 % — ABNORMAL HIGH (ref 11.5–15.5)
WBC: 9.1 10*3/uL (ref 4.0–10.5)

## 2014-01-23 NOTE — Anesthesia Postprocedure Evaluation (Signed)
Anesthesia Post Note  Patient: Marilyn Norman  Procedure(s) Performed: Procedure(s) (LRB): REPEAT CESAREAN SECTION WITH BILATERAL TUBAL LIGATION (N/A)  Anesthesia type: Spinal  Patient location: Mother/Baby  Post pain: Pain level controlled  Post assessment: Post-op Vital signs reviewed  Last Vitals:  Filed Vitals:   01/23/14 0730  BP: 112/67  Pulse: 73  Temp: 36.2 C  Resp: 18    Post vital signs: Reviewed  Level of consciousness: awake  Complications: No apparent anesthesia complications

## 2014-01-23 NOTE — Progress Notes (Signed)
Ur chart review completed.  

## 2014-01-23 NOTE — Progress Notes (Signed)
Subjective: Postpartum Day 1: Cesarean Delivery Patient reports tolerating PO.    Objective: Vital signs in last 24 hours: Temp:  [97.2 F (36.2 C)-98.7 F (37.1 C)] 97.2 F (36.2 C) (06/03 0730) Pulse Rate:  [64-91] 73 (06/03 0730) Resp:  [18-22] 18 (06/03 0730) BP: (112-164)/(57-87) 112/67 mmHg (06/03 0730) SpO2:  [93 %-99 %] 98 % (06/03 0730) Weight:  [205 lb (92.987 kg)] 205 lb (92.987 kg) (06/02 1518)  Physical Exam:  General: alert and no distress Lochia: appropriate Uterine Fundus: firm Incision: healing well DVT Evaluation: No evidence of DVT seen on physical exam.   Recent Labs  01/21/14 1210 01/23/14 0547  HGB 11.1* 8.6*  HCT 33.6* 25.8*    Assessment/Plan: Status post Cesarean section. Doing well postoperatively.  Continue current care.  Brock Bad 01/23/2014, 8:36 AM

## 2014-01-23 NOTE — Addendum Note (Signed)
Addendum created 01/23/14 0802 by Graciela Husbands, CRNA   Modules edited: Notes Section   Notes Section:  File: [id number]

## 2014-01-23 NOTE — Clinical Social Work Maternal (Signed)
Clinical Social Work Department  PSYCHOSOCIAL ASSESSMENT - MATERNAL/CHILD  01/23/2014  Patient: Marilyn Norman,Marilyn Norman Account Number: [id number] Admit Date: 01/22/2014  Childs Name:  Christian Norman   Clinical Social Worker: Susi Goslin, LCSW Date/Time: 01/23/2014 03:55 PM  Date Referred: 01/23/2014  Referral source   CN    Referred reason   Substance Abuse   Other referral source:  I: FAMILY / HOME ENVIRONMENT  Child's legal guardian: PARENT  Guardian - Name  Guardian - Age  Guardian - Address   Marilyn Norman  23  [address] [place], [place] [address]   Marilyn Norman  27    Other household support members/support persons  Name  Relationship  DOB   Penny Miller  MOTHER     DAUGHTER  7 years old    SON  4 years old   Other support:  II PSYCHOSOCIAL DATA  Information Source: Patient Interview  Financial and Community Resources  Employment:  Financial resources: Medicaid  If Medicaid - County: [place]  Other   Food Stamps   WIC   School / Grade:  Maternity Care Coordinator / Child Services Coordination / Early Interventions: Cultural issues impacting care:  III STRENGTHS  Strengths   Adequate Resources   Home prepared for Child (including basic supplies)   Supportive family/friends   Strength comment:  IV RISK FACTORS AND CURRENT PROBLEMS  Current Problem: YES  Risk Factor & Current Problem  Patient Issue  Family Issue  Risk Factor / Current Problem Comment   Substance Abuse  Y  N  Hx of MJ use   V SOCIAL WORK ASSESSMENT  CSW met with pt to assess history of MJ use & offer resources as needed. Pt is a 23 year old, G3P3 who lives at home with her mother & children. She was soft spoken & seemed to avoid eye contact during assessment. She was very pleasant however & cooperative. Pt admitted to smoking MJ "everyday" prior to pregnancy confirmation at 2 1/2 months. Once pregnancy was confirmed, she continued to smoke but "slowed down" to smoking "once a week or hit it once a  day." The last time she smoked MJ was last week. CSW inquired about her reason for smoking & pt said " it kept me from stressing." CSW asked about the source of pt's stress & she said "family situations." When CSW encouraged pt to elaborate, she said " I don't want to because it's gonna make me cry." CSW inquired about any previous mental health treatment however pt told CSW that she doesn't like talking to people (therapist) & never took medication. She declines both resources offered to her today by this writer. Pt states the source of stress has resolved. She denies any current depression/anxiety symptoms. No PP depression history noted or acknowledged. Pt's affect appears flat. CSW provided her with PP depression literature & encouraged her to seek medical attention if needed. She identifies her mother as her primary support person. Hospital drug testing policy explained & pt verbalized an understanding. UDS is negative, meconium results are pending. The pt has all the necessary supplies for the infant. CSW will continue to monitor drug screen results & make a referral if needed.   VI SOCIAL WORK PLAN  Social Work Plan   No Further Intervention Required / No Barriers to Discharge   Type of pt/family education:  If child protective services report - county:  If child protective services report - date:  Information/referral to community resources comment:  Other social work   plan:     

## 2014-01-24 NOTE — Progress Notes (Signed)
Subjective: Postpartum Day 2: Cesarean Delivery Patient reports tolerating PO, + flatus and no problems voiding.    Objective: Vital signs in last 24 hours: Temp:  [97.2 F (36.2 C)-98.8 F (37.1 C)] 97.6 F (36.4 C) (06/04 0540) Pulse Rate:  [73-97] 97 (06/04 0540) Resp:  [18-20] 20 (06/04 0540) BP: (112-154)/(67-88) 154/88 mmHg (06/04 0540) SpO2:  [98 %] 98 % (06/03 0730)  Physical Exam:  General: alert and no distress Lochia: appropriate Uterine Fundus: firm Incision: healing well DVT Evaluation: No evidence of DVT seen on physical exam.   Recent Labs  01/21/14 1210 01/23/14 0547  HGB 11.1* 8.6*  HCT 33.6* 25.8*    Assessment/Plan: Status post Cesarean section. Doing well postoperatively.  Continue current care.  Brock Bad 01/24/2014, 7:16 AM

## 2014-01-25 ENCOUNTER — Encounter (HOSPITAL_COMMUNITY): Payer: Self-pay | Admitting: *Deleted

## 2014-01-25 MED ORDER — IBUPROFEN 600 MG PO TABS: 600.0000 mg | ORAL_TABLET | Freq: Four times a day (QID) | ORAL | Status: DC | PRN

## 2014-01-25 MED ORDER — FUSION PLUS PO CAPS: 1.0000 | ORAL_CAPSULE | Freq: Every day | ORAL | Status: DC

## 2014-01-25 MED ORDER — OXYCODONE-ACETAMINOPHEN 5-325 MG PO TABS: 1.0000 | ORAL_TABLET | ORAL | Status: DC | PRN

## 2014-01-25 NOTE — Discharge Summary (Signed)
Obstetric Discharge Summary Reason for Admission: cesarean section Prenatal Procedures: ultrasound Intrapartum Procedures: cesarean: low cervical, transverse Postpartum Procedures: none Complications-Operative and Postpartum: none Hemoglobin  Date Value Ref Range Status  01/23/2014 8.6* 12.0 - 15.0 g/dL Final     DELTA CHECK NOTED     REPEATED TO VERIFY     HCT  Date Value Ref Range Status  01/23/2014 25.8* 36.0 - 46.0 % Final    Physical Exam:  General: alert and no distress Lochia: appropriate Uterine Fundus: firm Incision: healing well DVT Evaluation: No evidence of DVT seen on physical exam.  Discharge Diagnoses: Term Pregnancy-delivered  Discharge Information: Date: 01/25/2014 Activity: pelvic rest Diet: routine Medications: PNV, Ibuprofen, Colace, Iron and Percocet Condition: stable Instructions: refer to practice specific booklet Discharge to: home Follow-up Information   Follow up with MARSHALL,BERNARD A, MD In 6 weeks.   Specialty:  Obstetrics and Gynecology   Contact information:   [address] [place] [address] [phone]       Newborn Data: Live born female  Birth Weight: 7 lb 3.5 oz (3275 g) APGAR: 8, 9  Home with mother.  Brock Bad 01/25/2014, 7:53 AM

## 2014-01-25 NOTE — Progress Notes (Signed)
Subjective: Postpartum Day 3: Cesarean Delivery Patient reports tolerating PO, + flatus, + BM and no problems voiding.    Objective: Vital signs in last 24 hours: Temp:  [98.2 F (36.8 C)-98.3 F (36.8 C)] 98.2 F (36.8 C) (06/05 0533) Pulse Rate:  [82-97] 82 (06/05 0533) Resp:  [20] 20 (06/05 0533) BP: (145-165)/(90) 165/90 mmHg (06/05 0533) SpO2:  [99 %] 99 % (06/05 0533)  Physical Exam:  General: alert and no distress Lochia: appropriate Uterine Fundus: firm Incision: healing well DVT Evaluation: No evidence of DVT seen on physical exam.   Recent Labs  01/23/14 0547  HGB 8.6*  HCT 25.8*    Assessment/Plan: Status post Cesarean section. Doing well postoperatively. Anemia.  Clinically stable.  Iron Rx. Discharge home with standard precautions and return to clinic in 4-6 weeks.  Brock Bad 01/25/2014, 7:50 AM

## 2014-02-11 ENCOUNTER — Inpatient Hospital Stay (HOSPITAL_COMMUNITY)
Admission: EM | Admit: 2014-02-11 | Discharge: 2014-02-12 | DRG: 776 | Disposition: A | Discharge status: Home / Self Care | Payer: Medicaid Other | Attending: Obstetrics | Admitting: Obstetrics

## 2014-02-11 ENCOUNTER — Emergency Department (HOSPITAL_COMMUNITY): Payer: Medicaid Other

## 2014-02-11 ENCOUNTER — Encounter (HOSPITAL_COMMUNITY): Payer: Self-pay | Admitting: Emergency Medicine

## 2014-02-11 DIAGNOSIS — R079 Chest pain, unspecified: Secondary | ICD-10-CM | POA: Diagnosis present

## 2014-02-11 DIAGNOSIS — I43 Cardiomyopathy in diseases classified elsewhere: Secondary | ICD-10-CM | POA: Diagnosis present

## 2014-02-11 DIAGNOSIS — Z87891 Personal history of nicotine dependence: Secondary | ICD-10-CM

## 2014-02-11 DIAGNOSIS — D649 Anemia, unspecified: Secondary | ICD-10-CM | POA: Diagnosis present

## 2014-02-11 DIAGNOSIS — O903 Peripartum cardiomyopathy: Secondary | ICD-10-CM | POA: Diagnosis present

## 2014-02-11 DIAGNOSIS — E0500 Thyrotoxicosis with diffuse goiter without thyrotoxic crisis or storm: Secondary | ICD-10-CM | POA: Diagnosis present

## 2014-02-11 DIAGNOSIS — O99285 Endocrine, nutritional and metabolic diseases complicating the puerperium: Secondary | ICD-10-CM

## 2014-02-11 DIAGNOSIS — I5041 Acute combined systolic (congestive) and diastolic (congestive) heart failure: Secondary | ICD-10-CM | POA: Diagnosis present

## 2014-02-11 DIAGNOSIS — E0590 Thyrotoxicosis, unspecified without thyrotoxic crisis or storm: Secondary | ICD-10-CM

## 2014-02-11 DIAGNOSIS — E079 Disorder of thyroid, unspecified: Secondary | ICD-10-CM | POA: Diagnosis present

## 2014-02-11 DIAGNOSIS — O9943 Diseases of the circulatory system complicating the puerperium: Principal | ICD-10-CM

## 2014-02-11 DIAGNOSIS — O9081 Anemia of the puerperium: Secondary | ICD-10-CM | POA: Diagnosis present

## 2014-02-11 DIAGNOSIS — I517 Cardiomegaly: Secondary | ICD-10-CM

## 2014-02-11 DIAGNOSIS — I110 Hypertensive heart disease with heart failure: Secondary | ICD-10-CM | POA: Diagnosis present

## 2014-02-11 DIAGNOSIS — I509 Heart failure, unspecified: Secondary | ICD-10-CM | POA: Insufficient documentation

## 2014-02-11 DIAGNOSIS — I2510 Atherosclerotic heart disease of native coronary artery without angina pectoris: Principal | ICD-10-CM | POA: Diagnosis present

## 2014-02-11 DIAGNOSIS — I10 Essential (primary) hypertension: Secondary | ICD-10-CM

## 2014-02-11 HISTORY — DX: Thyrotoxicosis with diffuse goiter without thyrotoxic crisis or storm: E05.00

## 2014-02-11 HISTORY — DX: Unspecified pre-eclampsia, unspecified trimester: O14.90

## 2014-02-11 LAB — COMPREHENSIVE METABOLIC PANEL
ALT: 32 U/L (ref 0–35)
AST: 23 U/L (ref 0–37)
Albumin: 3 g/dL — ABNORMAL LOW (ref 3.5–5.2)
Alkaline Phosphatase: 236 U/L — ABNORMAL HIGH (ref 39–117)
BUN: 6 mg/dL (ref 6–23)
CO2: 23 mEq/L (ref 19–32)
Calcium: 9.1 mg/dL (ref 8.4–10.5)
Chloride: 101 mEq/L (ref 96–112)
Creatinine, Ser: 0.59 mg/dL (ref 0.50–1.10)
GFR calc non Af Amer: >90 mL/min (ref >90)
GLUCOSE: 100 mg/dL — AB (ref 70–99)
Potassium: 3.8 mEq/L (ref 3.7–5.3)
SODIUM: 139 meq/L (ref 137–147)
Total Bilirubin: 0.7 mg/dL (ref 0.3–1.2)
Total Protein: 7 g/dL (ref 6.0–8.3)

## 2014-02-11 LAB — URINALYSIS, ROUTINE W REFLEX MICROSCOPIC
BILIRUBIN URINE: NEGATIVE
Glucose, UA: NEGATIVE mg/dL
KETONES UR: NEGATIVE mg/dL
Nitrite: NEGATIVE
Protein, ur: NEGATIVE mg/dL
SPECIFIC GRAVITY, URINE: 1.008 (ref 1.005–1.030)
UROBILINOGEN UA: 1 mg/dL (ref 0.0–1.0)
pH: 6.5 (ref 5.0–8.0)

## 2014-02-11 LAB — PRO B NATRIURETIC PEPTIDE: PRO B NATRI PEPTIDE: 4338 pg/mL — AB (ref 0–125)

## 2014-02-11 LAB — PROTIME-INR
INR: 1.09 (ref 0.00–1.49)
PROTHROMBIN TIME: 13.9 s (ref 11.6–15.2)

## 2014-02-11 LAB — URINE MICROSCOPIC-ADD ON

## 2014-02-11 LAB — CBC WITH DIFFERENTIAL/PLATELET
Basophils Absolute: 0 10*3/uL (ref 0.0–0.1)
Basophils Relative: 1 % (ref 0–1)
EOS PCT: 10 % — AB (ref 0–5)
Eosinophils Absolute: 0.9 10*3/uL — ABNORMAL HIGH (ref 0.0–0.7)
HCT: 29.8 % — ABNORMAL LOW (ref 36.0–46.0)
Hemoglobin: 9.6 g/dL — ABNORMAL LOW (ref 12.0–15.0)
LYMPHS ABS: 3.7 10*3/uL (ref 0.7–4.0)
Lymphocytes Relative: 44 % (ref 12–46)
MCH: 24.2 pg — AB (ref 26.0–34.0)
MCHC: 32.2 g/dL (ref 30.0–36.0)
MCV: 75.1 fL — ABNORMAL LOW (ref 78.0–100.0)
Monocytes Absolute: 0.7 10*3/uL (ref 0.1–1.0)
Monocytes Relative: 8 % (ref 3–12)
Neutro Abs: 3.1 10*3/uL (ref 1.7–7.7)
Neutrophils Relative %: 37 % — ABNORMAL LOW (ref 43–77)
PLATELETS: 393 10*3/uL (ref 150–400)
RBC: 3.97 MIL/uL (ref 3.87–5.11)
RDW: 15.8 % — ABNORMAL HIGH (ref 11.5–15.5)
WBC: 8.5 10*3/uL (ref 4.0–10.5)

## 2014-02-11 LAB — T4, FREE: Free T4: 3.18 ng/dL — ABNORMAL HIGH (ref 0.80–1.80)

## 2014-02-11 LAB — BASIC METABOLIC PANEL
BUN: 6 mg/dL (ref 6–23)
CO2: 25 mEq/L (ref 19–32)
CREATININE: 0.7 mg/dL (ref 0.50–1.10)
Calcium: 9.3 mg/dL (ref 8.4–10.5)
Chloride: 101 mEq/L (ref 96–112)
Glucose, Bld: 105 mg/dL — ABNORMAL HIGH (ref 70–99)
Potassium: 4 mEq/L (ref 3.7–5.3)
Sodium: 141 mEq/L (ref 137–147)

## 2014-02-11 LAB — TROPONIN I: Troponin I: <0.3 ng/mL (ref <0.30)

## 2014-02-11 LAB — MRSA PCR SCREENING: MRSA by PCR: NEGATIVE

## 2014-02-11 LAB — TSH: TSH: 0.009 u[IU]/mL — AB (ref 0.350–4.500)

## 2014-02-11 MED ORDER — LABETALOL HCL 300 MG PO TABS
300.0000 mg | ORAL_TABLET | Freq: Three times a day (TID) | ORAL | Status: DC
  Administered 2014-02-11 – 2014-02-12 (×4): 300 mg via ORAL
  Filled 2014-02-11 (×7): qty 1

## 2014-02-11 MED ORDER — FUROSEMIDE 10 MG/ML IJ SOLN: 40.0000 mg | Freq: Once | INTRAMUSCULAR | Status: DC

## 2014-02-11 MED ORDER — NITROGLYCERIN 2 % TD OINT: 1.0000 [in_us] | TOPICAL_OINTMENT | Freq: Once | TRANSDERMAL | Status: DC

## 2014-02-11 MED ORDER — FUROSEMIDE 10 MG/ML IJ SOLN
40.0000 mg | Freq: Once | INTRAMUSCULAR | Status: AC
  Administered 2014-02-11: 40 mg via INTRAVENOUS
  Filled 2014-02-11 (×2): qty 4

## 2014-02-11 MED ORDER — FUROSEMIDE 10 MG/ML IJ SOLN
40.0000 mg | Freq: Once | INTRAMUSCULAR | Status: AC
  Administered 2014-02-11: 40 mg via INTRAVENOUS
  Filled 2014-02-11: qty 4

## 2014-02-11 MED ORDER — LABETALOL HCL 5 MG/ML IV SOLN
20.0000 mg | Freq: Once | INTRAVENOUS | Status: AC
  Administered 2014-02-11: 20 mg via INTRAVENOUS
  Filled 2014-02-11: qty 4

## 2014-02-11 MED ORDER — IOHEXOL 350 MG/ML SOLN
100.0000 mL | Freq: Once | INTRAVENOUS | Status: AC | PRN
  Administered 2014-02-11: 100 mL via INTRAVENOUS

## 2014-02-11 MED ORDER — FERROUS SULFATE 325 (65 FE) MG PO TABS
325.0000 mg | ORAL_TABLET | Freq: Two times a day (BID) | ORAL | Status: DC
  Administered 2014-02-11 – 2014-02-12 (×3): 325 mg via ORAL
  Filled 2014-02-11 (×3): qty 1

## 2014-02-11 MED ORDER — LISINOPRIL 10 MG PO TABS
10.0000 mg | ORAL_TABLET | Freq: Every day | ORAL | Status: DC
  Administered 2014-02-11 – 2014-02-12 (×2): 10 mg via ORAL
  Filled 2014-02-11 (×3): qty 1

## 2014-02-11 MED ORDER — LABETALOL HCL 5 MG/ML IV SOLN
40.0000 mg | INTRAVENOUS | Status: DC | PRN
  Administered 2014-02-11: 40 mg via INTRAVENOUS
  Filled 2014-02-11 (×2): qty 8

## 2014-02-11 MED ORDER — SODIUM CHLORIDE 0.9 % IV SOLN
INTRAVENOUS | Status: DC
  Administered 2014-02-11: 01:00:00 via INTRAVENOUS

## 2014-02-11 MED ORDER — METHIMAZOLE 10 MG PO TABS
20.0000 mg | ORAL_TABLET | Freq: Three times a day (TID) | ORAL | Status: DC
  Administered 2014-02-11 – 2014-02-12 (×3): 20 mg via ORAL
  Filled 2014-02-11 (×5): qty 2

## 2014-02-11 MED ORDER — CHOLESTYRAMINE 4 G PO PACK
4.0000 g | PACK | Freq: Two times a day (BID) | ORAL | Status: DC
  Administered 2014-02-11 – 2014-02-12 (×2): 4 g via ORAL
  Filled 2014-02-11 (×4): qty 1

## 2014-02-11 MED ORDER — SODIUM CHLORIDE 0.9 % IV SOLN: INTRAVENOUS | Status: AC

## 2014-02-11 MED ORDER — POTASSIUM CHLORIDE CRYS ER 20 MEQ PO TBCR
40.0000 meq | EXTENDED_RELEASE_TABLET | Freq: Once | ORAL | Status: AC
  Administered 2014-02-11: 40 meq via ORAL
  Filled 2014-02-11: qty 2

## 2014-02-11 MED ORDER — FUROSEMIDE 10 MG/ML IJ SOLN: 20.0000 mg | Freq: Once | INTRAMUSCULAR | Status: DC

## 2014-02-11 MED ORDER — FUROSEMIDE 20 MG PO TABS
20.0000 mg | ORAL_TABLET | Freq: Two times a day (BID) | ORAL | Status: DC
  Administered 2014-02-11 – 2014-02-12 (×2): 20 mg via ORAL
  Filled 2014-02-11 (×5): qty 1

## 2014-02-11 MED ORDER — METHIMAZOLE 10 MG PO TABS
10.0000 mg | ORAL_TABLET | Freq: Three times a day (TID) | ORAL | Status: DC
  Administered 2014-02-11 (×2): 10 mg via ORAL
  Filled 2014-02-11 (×4): qty 1

## 2014-02-11 NOTE — ED Notes (Signed)
The pt had a c-section 2 weeks ago and for th past 2-3 days she has had episodes of sob.  At present she is hyperventilating.  .  She has a cough for 4-5 days.  No temp sweating a lot according to her mother

## 2014-02-11 NOTE — Progress Notes (Signed)
  Echocardiogram 2D Echocardiogram has been performed.  Jorje Guild 02/11/2014, 1:27 PM

## 2014-02-11 NOTE — ED Notes (Signed)
Pain in the lt side of her head

## 2014-02-11 NOTE — ED Notes (Signed)
[facility] has transferred the pt.

## 2014-02-11 NOTE — ED Notes (Signed)
Pt has hx of pre-eclampsia with this last pregnancy.

## 2014-02-11 NOTE — Consult Note (Signed)
CARDIOLOGY CONSULT NOTE     2  Patient ID: Marilyn Norman MRN: [id number] DOB/AGE: 24-10-1990 24 y.o.  Admit date: 02/11/2014 Referring Physician:  Gaynell FaceMarshall Primary Physician: Ihor GullyPowers, Stephanie J Primary Cardiologist:  New Reason for Consultation:  CHF  Active Problems:   Acute CHF (congestive heart failure)   HPI:  23 yo postpartum  Has 3 children  D/C with healthy boy 6/2.  History of pre-eclampsia and HTN.  Appears that her BP has been under Rx.  She also is hyperthyroid and has seen Dr Sharl MaKerr.  07/31/13  TSH supressed .008 with Free T4 still very elevated 4/22 at 3.6.  She indicates being compliant with methimazole and labatolol but has only been on them for a couple of weeks  No history of DCM  Has had normal echo in past and normal stress echo in 2013 for atypical chest pain EF 55-60% and no congenital heart disease.  Still using salt  Denies ETOH  Smokes 2cigs/day.  Mother helping with babies newborn, age 324 and age 427.  3 days of increasing dyspnea.  No fever or sputum  CXR with CHF and effusions  Not clear why ( especially with active hyperthyroidism should not receive iodinated contrast !) had CT negative for PE.  BNP elevated 4338  Also has anemia and low albumin  No chest pain Echo not done yet  ROS All other systems reviewed and negative except as noted above  Past Medical History  Diagnosis Date  . Chest pain   . Cardiomegaly   . Costochondritis   . Thyroid disease     hyperthyroid  . Hyperthyroidism   . Chlamydia   . Trichomonas infection   . Hypertension   . Pre-eclampsia     with each pregnancy    Family History  Problem Relation Age of Onset  . Asthma Mother   . Heart disease Father     History   Social History  . Marital Status: Single    Spouse Name: N/A    Number of Children: N/A  . Years of Education: N/A   Occupational History  . Not on file.   Social History Main Topics  . Smoking status: Former Smoker    Quit date: 10/19/2013  . Smokeless  tobacco: Not on file  . Alcohol Use: No  . Drug Use: Yes    Special: Marijuana     Comment: 01/18/14  . Sexual Activity: Yes    Birth Control/ Protection: Condom     Comment: Last intercourse 2 wks ago   Other Topics Concern  . Not on file   Social History Narrative  . No narrative on file    Past Surgical History  Procedure Laterality Date  . Cesarean section    . Iud removal    . Cesarean section with bilateral tubal ligation N/A 01/22/2014    Procedure: REPEAT CESAREAN SECTION WITH BILATERAL TUBAL LIGATION;  Surgeon: Kathreen CosierBernard A Marshall, MD;  Location: [facility];  Service: Obstetrics;  Laterality: N/A;     . sodium chloride   Intravenous STAT  . furosemide  20 mg Oral BID  . labetalol  300 mg Oral TID  . lisinopril  10 mg Oral Daily  . methimazole  10 mg Oral TID  . potassium chloride  40 mEq Oral Once      Physical Exam: BP 150/104  Pulse 113  Temp(Src) 99.2 F (37.3 C) (Oral)  Resp 34  Ht 5\' 7"  (1.702 m)  Wt 179 lb 6.4  oz (81.375 kg)  BMI 28.09 kg/m2  SpO2 99%  Breastfeeding? No  Affect appropriate Healthy:  appears stated age HEENT: normal Neck supple with no adenopathy JVP elevated no bruits no thyromegaly Lungs clear with no wheezing and good diaphragmatic motion Heart:  S1/S2 S5 gallop  no murmur, no rub, or click PMI normal Abdomen: benighn, BS positve, no tenderness, no AAA recent C section  no bruit.  No HSM or HJR Distal pulses intact with no bruits No edema Neuro non-focal Skin warm and dry No muscular weakness Foley catheter    Labs:   Lab Results  Component Value Date   WBC 8.5 02/11/2014   HGB 9.6* 02/11/2014   HCT 29.8* 02/11/2014   MCV 75.1* 02/11/2014   PLT 393 02/11/2014    Recent Labs Lab 02/11/14 0059  NA 139  K 3.8  CL 101  CO2 23  BUN 6  CREATININE 0.59  CALCIUM 9.1  PROT 7.0  BILITOT 0.7  ALKPHOS 236*  ALT 32  AST 23  GLUCOSE 100*   Lab Results  Component Value Date   TROPONINI <0.30 02/11/2014      Radiology: Ct Angio Chest Pe W/cm &/or Wo Cm  02/11/2014   CLINICAL DATA:  C-section 2 weeks ago and shortness of breath for 3 days. Cough. Hyperventilation. Sweating and vomiting.  EXAM: CT ANGIOGRAPHY CHEST WITH CONTRAST  TECHNIQUE: Multidetector CT imaging of the chest was performed using the standard protocol during bolus administration of intravenous contrast. Multiplanar CT image reconstructions and MIPs were obtained to evaluate the vascular anatomy.  CONTRAST:  OMNIPAQUE IOHEXOL 350 MG/ML SOLN  COMPARISON:  Chest radiograph 02/11/2014  FINDINGS: Technically adequate study with good opacification of the central and segmental pulmonary arteries. No focal filling defects. No evidence of significant pulmonary embolus. Cardiac enlargement. No pericardial effusion. Normal caliber thoracic aorta without dissection. Esophagus is decompressed. Mass or lymphadenopathy in the anterior mediastinum, measuring up to about 2.7 x 4.3 cm diameter. Additional enlarged lymph nodes in the aortopulmonic window, pretracheal, subcarinal, right paratracheal, and bilateral hilar regions. Small bilateral pleural effusions. Perihilar airspace disease, likely edema bilaterally. Diffuse thyroid gland enlargement. No pneumothorax. Visualized upper abdominal organs are grossly unremarkable.  Review of the MIP images confirms the above findings.  IMPRESSION: No evidence of significant pulmonary embolus. Cardiac enlargement with pulmonary vascular congestion, bilateral perihilar edema, and small bilateral pleural effusions. Mass or lymphadenopathy in the anterior mediastinum with bilateral hilar and diffuse mediastinal lymphadenopathy. This is nonspecific and could indicate reactive lymphadenopathy, neoplasm, lymphoma, or inflammatory process. Diffuse thyroid enlargement is likely due to goiter.   Electronically Signed   By: Burman Nieves M.D.   On: 02/11/2014 03:47   Dg Chest Port 1 View  02/11/2014   CLINICAL DATA:   C-section 2 weeks ago. Shortness of breath for 2-3 days. Hyperventilated and. Cough for 5 days.  EXAM: PORTABLE CHEST - 1 VIEW  COMPARISON:  04/01/2012  FINDINGS: Shallow inspiration. Cardiac enlargement with mild central vascular congestion. Perihilar interstitial changes suggest edema. Possible small right pleural effusion. No pneumothorax.  IMPRESSION: Cardiac enlargement with mild vascular congestion and perihilar edema. Possible small right pleural effusion.   Electronically Signed   By: Burman Nieves M.D.   On: 02/11/2014 01:21    EKG:  SR no acute ST changes    ASSESSMENT AND PLAN:  CHF:  In setting of poorly Rx HTN , active hyperthyroidism, low albumin and anemia.  Needs echo to r/o any contribution of postpartum DCM.  Start PO labatolol at slightly higher dose than her current home dose as still tachycardic,  Add bid oral lasix and ACE.  ( she is not breast feeding)  Check BMET in am One dose Kdur today  Thyroid:  Would have Dr Sharl Ma see in hospital to see if anything besides beta blockade and methimazole can be done to expedite Rx of hyperthyroidism  ? Radioactive iodine Rx.  Increase beta blockade Check TSH/T4  Avoid any further administration of iodinated contrast.    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

## 2014-02-11 NOTE — Progress Notes (Signed)
Orders received for prn labetalol

## 2014-02-11 NOTE — ED Notes (Signed)
This RN received a call from CT, stating the pt was vomiting. This RN to give the pt her lasix in CT.

## 2014-02-11 NOTE — H&P (Signed)
This is Dr. Francoise Ceo dictating the history and physical on  Marilyn Norman she's a 23 year old gravida 6 para 19 old 51 who had a C-section on June 2 a repeat and tubal ligation during her pregnancies she had multiple hospitalizations because of chronic hypertension she is also had hyperthyroidism and is on Tapazole 20 mg 3 times a day patient is noted admitted with shortness of breath which she says started 4 days prior to admission and she thought she had a cold she went to [facility] chest x-ray showed bilateral pleural effusions she had a spiral CT which confirmed the same and no sign of an embolus or history of high blood pressure has been long-standing and: Blood pressure was 146/113 [phone] is now 155 a 1.6 she is on labetalol 400 mg 4 times a day she was started on Norvasc 10 mg by mouth this a.m. and she received labetalol 40 mg IV and also Lasix 40 mg IV and is diuresing well were oxygen saturation of 95 on 3 L, of O2 past surgical history C-section x3 past medical history history of chronic hypertension with the preeclampsia in her previous pregnancies so Social history negative Physical exam well-developed female in some respiratory distress Breasts negative Lungs decreased breath sounds bilaterally Heart regular rhythm no murmurs no gallops Abdomen uterus 20 week post partum size Extremities 2+ edema

## 2014-02-11 NOTE — ED Provider Notes (Signed)
CSN: [id number]     Arrival date & time 02/11/14  0008 History   First MD Initiated Contact with Patient 02/11/14 0037     Chief Complaint  Patient presents with  . Shortness of Breath      HPI Pt was seen at 0040. Per pt and her mother, c/o gradual onset and worsening of persistent SOB for the past 3 days. Has been associated with cough and post-tussive emesis. Pt's mother states pt has been "sweating a lot too." Pt has significant hx of s/p c-section 2 weeks ago with "pre-eclampsia with all my pregnancies." States she has been taking her usual medications as previously prescribed. Denies CP/palpitations, no fevers, no flank pain, no abd pain, no diarrhea, no vaginal discharge, no rash.    OB/GYN: Dr. Gaynell Face  Past Medical History  Diagnosis Date  . Chest pain   . Cardiomegaly   . Costochondritis   . Thyroid disease     hyperthyroid  . Hyperthyroidism   . Chlamydia   . Trichomonas infection   . Hypertension   . Pre-eclampsia     with each pregnancy   Past Surgical History  Procedure Laterality Date  . Cesarean section    . Iud removal    . Cesarean section with bilateral tubal ligation N/A 01/22/2014    Procedure: REPEAT CESAREAN SECTION WITH BILATERAL TUBAL LIGATION;  Surgeon: Kathreen Cosier, MD;  Location: [facility];  Service: Obstetrics;  Laterality: N/A;   Family History  Problem Relation Age of Onset  . Asthma Mother   . Heart disease Father    History  Substance Use Topics  . Smoking status: Former Smoker    Quit date: 10/19/2013  . Smokeless tobacco: Not on file  . Alcohol Use: No   OB History   Grav Para Term Preterm Abortions TAB SAB Ect Mult Living   6 3 3  3  3   3      Review of Systems ROS: Statement: All systems negative except as marked or noted in the HPI; Constitutional: Negative for fever and chills. ; ; Eyes: Negative for eye pain, redness and discharge. ; ; ENMT: Negative for ear pain, hoarseness, nasal congestion, sinus pressure and sore  throat. ; ; Cardiovascular: +SOB, diaphoresis. Negative for chest pain, palpitations, and peripheral edema. ; ; Respiratory: +cough with post-tussive emesis. Negative for wheezing and stridor. ; ; Gastrointestinal: Negative for diarrhea, abdominal pain, blood in stool, hematemesis, jaundice and rectal bleeding. . ; ; Genitourinary: Negative for dysuria, flank pain and hematuria. ; ; Musculoskeletal: Negative for back pain and neck pain. Negative for swelling and trauma.; ; Skin: Negative for pruritus, rash, abrasions, blisters, bruising and skin lesion.; ; Neuro: Negative for headache, lightheadedness and neck stiffness. Negative for weakness, altered level of consciousness , altered mental status, extremity weakness, paresthesias, involuntary movement, seizure and syncope.       Allergies  Strawberry  Home Medications   Prior to Admission medications   Medication Sig Start Date End Date Taking? Authorizing Naevia Unterreiner  ibuprofen (ADVIL,MOTRIN) 600 MG tablet Take 1 tablet (600 mg total) by mouth every 6 (six) hours as needed for mild pain. 01/25/14  Yes Brock Bad, MD  Iron-FA-B Cmp-C-Biot-Probiotic (FUSION PLUS) CAPS Take 1 capsule by mouth daily before breakfast. 01/25/14  Yes Brock Bad, MD  labetalol (NORMODYNE) 200 MG tablet Take 200 mg by mouth 3 (three) times daily.   Yes Historical Yuma Pacella, MD  methimazole (TAPAZOLE) 10 MG tablet Take 10 mg by  mouth 3 (three) times daily.   Yes Historical Taren Dymek, MD  oxyCODONE-acetaminophen (PERCOCET/ROXICET) 5-325 MG per tablet Take 1-2 tablets by mouth every 4 (four) hours as needed for severe pain (moderate - severe pain). 01/25/14  Yes Brock Badharles A Harper, MD   BP 209/140  Pulse 133  Temp(Src) 98.2 F (36.8 C) (Oral)  Resp 26  Ht 5\' 7"  (1.702 m)  Wt 190 lb (86.183 kg)  BMI 29.75 kg/m2  SpO2 95% BP 173/125  Pulse 133  Temp(Src) 99.6 F (37.6 C) (Rectal)  Resp 37  Ht 5\' 7"  (1.702 m)  Wt 190 lb (86.183 kg)  BMI 29.75 kg/m2  SpO2 100%   Breastfeeding? No BP 153/103  Pulse 119  Temp(Src) 99.6 F (37.6 C) (Rectal)  Resp 67  Ht 5\' 7"  (1.702 m)  Wt 190 lb (86.183 kg)  BMI 29.75 kg/m2  SpO2 100%  Breastfeeding? No   Physical Exam 0045: Physical examination:  Nursing notes reviewed; Vital signs and O2 SAT reviewed;  Constitutional: Well developed, Well nourished, Uncomfortable appearing.; Head:  Normocephalic, atraumatic. Periorbital edema.; Eyes: EOMI, PERRL, No scleral icterus; ENMT: Mouth and pharynx normal, Mucous membranes moist; Neck: Supple, Full range of motion, No lymphadenopathy; Cardiovascular: Tachycardic rate and rhythm, +gallop; Respiratory: Breath sounds coarse & equal bilaterally, scattered faint exp wheezes. No audible wheezing. Speaking long phrases. Tachypneic. Sitting upright.; Chest: Nontender, Movement normal; Abdomen: Soft, Nontender, Nondistended, Normal bowel sounds; Genitourinary: No CVA tenderness; Extremities: Pulses normal, No tenderness, +1 oedal edema bilat without calf asymmetry.; Neuro: AA&Ox3, Major CN grossly intact.  Speech clear. No gross focal motor or sensory deficits in extremities.; Skin: Color normal, Warm, Dry.   ED Course  Procedures     EKG Interpretation   Date/Time:  Monday February 11 2014 00:13:37 EDT Ventricular Rate:  133 PR Interval:  120 QRS Duration: 72 QT Interval:  382 QTC Calculation: 568 R Axis:   92 Text Interpretation:   Critical Test Result: Long QTc Sinus tachycardia  Rightward axis Baseline wander Borderline ECG When compared with ECG of  04/01/2012 Rate faster and  QT has lengthened Confirmed by [facility]  MD,  Nicholos JohnsKATHLEEN [phone]([id number]) on 02/11/2014 1:03:20 AM      MDM  MDM Reviewed: previous chart, nursing note and vitals Reviewed previous: labs and ECG Interpretation: labs, ECG, x-ray and CT scan Total time providing critical care: 30-74 minutes. This excludes time spent performing separately reportable procedures and services. Consults: OB/GYN   CRITICAL  CARE Performed by: Laray AngerMCMANUS,KATHLEEN M Total critical care time: 40 Critical care time was exclusive of separately billable procedures and treating other patients. Critical care was necessary to treat or prevent imminent or life-threatening deterioration. Critical care was time spent personally by me on the following activities: development of treatment plan with patient and/or surrogate as well as nursing, discussions with consultants, evaluation of patient's response to treatment, examination of patient, obtaining history from patient or surrogate, ordering and performing treatments and interventions, ordering and review of laboratory studies, ordering and review of radiographic studies, pulse oximetry and re-evaluation of patient's condition.  Results for orders placed during the hospital encounter of 02/11/14  URINALYSIS, ROUTINE W REFLEX MICROSCOPIC      Result Value Ref Range   Color, Urine YELLOW  YELLOW   APPearance CLEAR  CLEAR   Specific Gravity, Urine 1.008  1.005 - 1.030   pH 6.5  5.0 - 8.0   Glucose, UA NEGATIVE  NEGATIVE mg/dL   Hgb urine dipstick LARGE (*) NEGATIVE   Bilirubin  Urine NEGATIVE  NEGATIVE   Ketones, ur NEGATIVE  NEGATIVE mg/dL   Protein, ur NEGATIVE  NEGATIVE mg/dL   Urobilinogen, UA 1.0  0.0 - 1.0 mg/dL   Nitrite NEGATIVE  NEGATIVE   Leukocytes, UA SMALL (*) NEGATIVE  CBC WITH DIFFERENTIAL      Result Value Ref Range   WBC 8.5  4.0 - 10.5 K/uL   RBC 3.97  3.87 - 5.11 MIL/uL   Hemoglobin 9.6 (*) 12.0 - 15.0 g/dL   HCT 16.1 (*) 09.6 - 04.5 %   MCV 75.1 (*) 78.0 - 100.0 fL   MCH 24.2 (*) 26.0 - 34.0 pg   MCHC 32.2  30.0 - 36.0 g/dL   RDW 40.9 (*) 81.1 - 91.4 %   Platelets 393  150 - 400 K/uL   Neutrophils Relative % 37 (*) 43 - 77 %   Neutro Abs 3.1  1.7 - 7.7 K/uL   Lymphocytes Relative 44  12 - 46 %   Lymphs Abs 3.7  0.7 - 4.0 K/uL   Monocytes Relative 8  3 - 12 %   Monocytes Absolute 0.7  0.1 - 1.0 K/uL   Eosinophils Relative 10 (*) 0 - 5 %    Eosinophils Absolute 0.9 (*) 0.0 - 0.7 K/uL   Basophils Relative 1  0 - 1 %   Basophils Absolute 0.0  0.0 - 0.1 K/uL  COMPREHENSIVE METABOLIC PANEL      Result Value Ref Range   Sodium 139  137 - 147 mEq/L   Potassium 3.8  3.7 - 5.3 mEq/L   Chloride 101  96 - 112 mEq/L   CO2 23  19 - 32 mEq/L   Glucose, Bld 100 (*) 70 - 99 mg/dL   BUN 6  6 - 23 mg/dL   Creatinine, Ser 7.82  0.50 - 1.10 mg/dL   Calcium 9.1  8.4 - 95.6 mg/dL   Total Protein 7.0  6.0 - 8.3 g/dL   Albumin 3.0 (*) 3.5 - 5.2 g/dL   AST 23  0 - 37 U/L   ALT 32  0 - 35 U/L   Alkaline Phosphatase 236 (*) 39 - 117 U/L   Total Bilirubin 0.7  0.3 - 1.2 mg/dL   GFR calc non Af Amer >90  >90 mL/min   GFR calc Af Amer >90  >90 mL/min  TROPONIN I      Result Value Ref Range   Troponin I <0.30  <0.30 ng/mL  PRO B NATRIURETIC PEPTIDE      Result Value Ref Range   Pro B Natriuretic peptide (BNP) 4338.0 (*) 0 - 125 pg/mL  PROTIME-INR      Result Value Ref Range   Prothrombin Time 13.9  11.6 - 15.2 seconds   INR 1.09  0.00 - 1.49  URINE MICROSCOPIC-ADD ON      Result Value Ref Range   Squamous Epithelial / LPF RARE  RARE   WBC, UA 3-6  <3 WBC/hpf   RBC / HPF 7-10  <3 RBC/hpf   Bacteria, UA RARE  RARE   Ct Angio Chest Pe W/cm &/or Wo Cm 02/11/2014   CLINICAL DATA:  C-section 2 weeks ago and shortness of breath for 3 days. Cough. Hyperventilation. Sweating and vomiting.  EXAM: CT ANGIOGRAPHY CHEST WITH CONTRAST  TECHNIQUE: Multidetector CT imaging of the chest was performed using the standard protocol during bolus administration of intravenous contrast. Multiplanar CT image reconstructions and MIPs were obtained to evaluate the vascular anatomy.  CONTRAST:  OMNIPAQUE IOHEXOL 350 MG/ML SOLN  COMPARISON:  Chest radiograph 02/11/2014  FINDINGS: Technically adequate study with good opacification of the central and segmental pulmonary arteries. No focal filling defects. No evidence of significant pulmonary embolus. Cardiac  enlargement. No pericardial effusion. Normal caliber thoracic aorta without dissection. Esophagus is decompressed. Mass or lymphadenopathy in the anterior mediastinum, measuring up to about 2.7 x 4.3 cm diameter. Additional enlarged lymph nodes in the aortopulmonic window, pretracheal, subcarinal, right paratracheal, and bilateral hilar regions. Small bilateral pleural effusions. Perihilar airspace disease, likely edema bilaterally. Diffuse thyroid gland enlargement. No pneumothorax. Visualized upper abdominal organs are grossly unremarkable.  Review of the MIP images confirms the above findings.  IMPRESSION: No evidence of significant pulmonary embolus. Cardiac enlargement with pulmonary vascular congestion, bilateral perihilar edema, and small bilateral pleural effusions. Mass or lymphadenopathy in the anterior mediastinum with bilateral hilar and diffuse mediastinal lymphadenopathy. This is nonspecific and could indicate reactive lymphadenopathy, neoplasm, lymphoma, or inflammatory process. Diffuse thyroid enlargement is likely due to goiter.   Electronically Signed   By: Burman Nieves M.D.   On: 02/11/2014 03:47   Dg Chest Port 1 View 02/11/2014   CLINICAL DATA:  C-section 2 weeks ago. Shortness of breath for 2-3 days. Hyperventilated and. Cough for 5 days.  EXAM: PORTABLE CHEST - 1 VIEW  COMPARISON:  04/01/2012  FINDINGS: Shallow inspiration. Cardiac enlargement with mild central vascular congestion. Perihilar interstitial changes suggest edema. Possible small right pleural effusion. No pneumothorax.  IMPRESSION: Cardiac enlargement with mild vascular congestion and perihilar edema. Possible small right pleural effusion.   Electronically Signed   By: Burman Nieves M.D.   On: 02/11/2014 01:21    0245:  BNP elevated with pulm vasc congestion on CXR. IV lasix ordered. T/C to OB/GYN Dr. Gaynell Face, case discussed, including:  HPI, pertinent PM/SHx, VS/PE, dx testing, ED course and treatment:  Agrees with  IV lasix, requests to obtain CT-A chest to r/o PE then transfer pt to [facility] ICU to his service.    0330:  CT-A chest negative for PE. Pt has had urine output 1L. Pt appears less restless on stretcher, leaning back and smiling, speaking in full sentences, stating she is "feeling better."  T/C to OB/GYN Dr. Gaynell Face, case discussed, including:  HPI, pertinent PM/SHx, VS/PE, dx testing, ED course and treatment:  Updated regarding CT-A, urine output, current VS; he requests to dose IV labetalol now (can repeat prn for continued HTN >160/105) before transfer to [facility] ICU, he has already updated the ICU pt is coming over in transfer. Dx and testing d/w pt and family.  Questions answered.  Verb understanding, agreeable to transfer/admit to [facility].          Laray Anger, DO 02/13/14 1801

## 2014-02-11 NOTE — Consult Note (Addendum)
Reason for Consult: hyperthyroidism Referring Physician: Dr. Francoise Ceo  Marilyn Norman is an 23 y.o. female.  History of Present Illness:  Marilyn Norman developed dyspnea and chest tightness prior to her hospital admission.  She feels better now after her initial hospital care. She reports taking her methimazole 10 mg tablet one tablet by mouth 3 times a day at home for her Graves' disease with hyperthyroidism.    Her most recent laboratory visit for thyroid function tests occurred on 11/19/2013. Her only endocrinology visit occurred on 10/29/2013. She missed or rescheduled multiple visits, but reports adherence to the medicine.  Her thyroid-associated symptoms persist, including heat intolerance.  She feels that her eye symptoms have been stable.  She is not planning to breast-feed.  Past Medical History  Diagnosis Date  . Chest pain   . Cardiomegaly   . Costochondritis   . Thyroid disease     hyperthyroid  . Hyperthyroidism   . Chlamydia   . Trichomonas infection   . Hypertension   . Pre-eclampsia     with each pregnancy  . Graves' eye disease     Past Surgical History  Procedure Laterality Date  . Cesarean section    . Iud removal    . Cesarean section with bilateral tubal ligation N/A 01/22/2014    Procedure: REPEAT CESAREAN SECTION WITH BILATERAL TUBAL LIGATION;  Surgeon: Kathreen Cosier, MD;  Location: [facility];  Service: Obstetrics;  Laterality: N/A;    Family History  Problem Relation Age of Onset  . Asthma Mother   . Heart disease Father     Social History:  reports that she quit smoking about 3 months ago. She does not have any smokeless tobacco history on file. She reports that she uses illicit drugs (Marijuana). She reports that she does not drink alcohol.  Update:  Today she shared that she has used a relatively modest amount of tobacco lately.    Allergies:  Allergies  Allergen Reactions  . Strawberry Itching, Rash, Hives and Swelling     Medications:  I have reviewed the patient's current medications. Scheduled: . sodium chloride   Intravenous STAT  . ferrous sulfate  325 mg Oral BID WC  . furosemide  20 mg Oral BID  . labetalol  300 mg Oral TID  . lisinopril  10 mg Oral Daily  . methimazole  10 mg Oral TID   Continuous:  DBZ:[id number]  ROS General: Weight loss from 214 pounds of body weight to 190 pounds of body weight, no weight gain, no fever. Endocrine:  No cold intolerance. Eyes:  Eye redness and bulging of eyes. Neck:  Hoarseness and difficulty swallowing. Cardiovascular: Palpitations, but no pedal edema. Respiratory: No cough. Gastrointestinal: Loose stools, no constipation. Neurologic: Tremor. Psychiatric: Insomnia and anxiety. Skin: No rash.  Blood pressure 123/79, pulse 103, temperature 98.4 F (36.9 C), temperature source Oral, resp. rate 28, height 5\' 7"  (1.702 m), weight 81.375 kg (179 lb 6.4 oz), SpO2 94.00%, not currently breastfeeding. Physical Exam General: No apparent distress. Eyes: Anicteric, mild periorbital puffiness and stare. Neck: Supple, trachea midline. Thyroid: Diffuse symmetric enlargement, nontender, no palpable thyroid nodule. Cardiovascular: Regular rhythm and rate, no murmur, normal radial pulses. Respiratory: Normal respiratory effort. Neurologic: Cranial nerves normal as tested, deep tendon reflexes 2+ and brisk, no tremor. Musculoskeletal: Normal muscle tone, no muscle atrophy. Skin: Mildly excessive warmth, no visible rash. Mental status: Alert, conversant, speech clear, thought logical, appropriate mood and affect, no hallucinations or delusions evident. Hematologic/lymphatic: No  cervical adenopathy, no jaundice.  Lab Results  Component Value Date   TSH 0.009* 02/11/2014   FREET4 3.18* 02/11/2014   ALKPHOS 236* 02/11/2014   AST 23 02/11/2014   ALT 32 02/11/2014   WBC 8.5 02/11/2014   NEUTROABS 3.1 02/11/2014   HGB 9.6* 02/11/2014   MCV 75.1* 02/11/2014   PLT 393  02/11/2014   10/29/2013:   Thyroid stimulating immunoglobulin 486% reference range 0-139 TSH 0.07 Free T4 4.29 ng/dL (on methimazole 5 mg per day) Alkaline phosphatase 245 U/L Hemoglobin 10.1 g/dL WBC 8.6  1/61/09603/30/2015:  Free T4 2.55 ng/dL (on methimazole 30 mg per day)  Assessment: Graves' disease with severe hyperthyroidism, Graves' orbitopathy, and goiter.   Elevated alkaline phosphatase--likely due to effects of hyperthyroidism on bone (and liver). Tobacco abuse.  For management of her hyperthyroidism: 1.  Increase methimazole from 30 mg per day to 60 mg per day.  During the hospital stay, this can be divided 3 times a day. The intrathyroidal half-life of methimazole is considerably longer than its blood half-life.  Therefore, methimazole (unlike propylthiouracil) is effective when given once a day.  When she returns home, use methimazole 10 mg tablets SIX tablets by mouth ONE time a day.   2.  Add cholestyramine 4 grams by mouth twice a day for the remainder of her hospital stay. Cholestyramine clears thyroid hormone from the enterohepatic circulation.  Therefore, it can have a modest effect on thyroid hormone levels in blood. 3.  Anticipate radioactive iodine treatment for hyperthyroidism as early as two months from now.  Radioactive iodine treatment must be postponed for least 6 weeks after the contrast from her CT scan.  Otherwise radioactive iodine treatment may not be as effective.  Radioactive iodine treatment can transiently worsen thyrotoxicosis; better control of hyperthyroidism should be achieved for her prior to radioactive iodine treatment. 4.  Continue beta-blocker therapy at the discretion of her cardiologist, Dr. Eden EmmsNishan.  Corticosteroid therapy can reduce conversion of T4 to the active thyroid hormone (T3).  However corticosteroid therapy could worsen sodium and fluid retention. Therefore I do not recommend corticosteroid therapy for management of her hyperthyroidism at this  time.  In the setting of recent iodinated contrast exposure, I would not expect potassium iodide (SSKI) or Lugol's solution to inhibit thyroid hormone release.    Two months of prednisone therapy starting just before radioactive iodine treatment can protect and improve Graves' eye disease.  Hopefully, she will be able to tolerate prednisone by the time we consider radioactive iodine treatment.   Selenium may help mild Graves' orbitopathy. Dorris Carnes[N Engl J Med 2011; 364:1920-1931.]  For management of her Graves' orbitopathy, Marilyn Norman agreed to: 1.  Start selenium 100 mcg supplement by mouth twice a day. 2.  Raise the head of her bed by 4 inches. 3.  Use artificial tears as needed for dry eyes.  4.  Wear sunglasses when exposed to bright light.  For the sake of her Graves' orbitopathy and for many other reasons, I recommended smoking cessation today.  I reviewed the care plan in detail with the patient and her nurse.    Marilyn Norman agreed to keep her office visit with me as already scheduled at 3:40PM on Monday, February 18, 2014.  KERR,JEFFREY 02/11/2014, 5:57 PM

## 2014-02-11 NOTE — ED Notes (Addendum)
[facility] has arrived to transport the pt.

## 2014-02-12 DIAGNOSIS — I5041 Acute combined systolic (congestive) and diastolic (congestive) heart failure: Secondary | ICD-10-CM | POA: Diagnosis present

## 2014-02-12 DIAGNOSIS — O903 Peripartum cardiomyopathy: Secondary | ICD-10-CM | POA: Diagnosis present

## 2014-02-12 DIAGNOSIS — E0590 Thyrotoxicosis, unspecified without thyrotoxic crisis or storm: Secondary | ICD-10-CM

## 2014-02-12 DIAGNOSIS — I10 Essential (primary) hypertension: Secondary | ICD-10-CM | POA: Diagnosis present

## 2014-02-12 LAB — COMPREHENSIVE METABOLIC PANEL
ALT: 32 U/L (ref 0–35)
AST: 24 U/L (ref 0–37)
Albumin: 2.6 g/dL — ABNORMAL LOW (ref 3.5–5.2)
Alkaline Phosphatase: 223 U/L — ABNORMAL HIGH (ref 39–117)
BUN: 8 mg/dL (ref 6–23)
CALCIUM: 9.3 mg/dL (ref 8.4–10.5)
CO2: 26 mEq/L (ref 19–32)
Chloride: 104 mEq/L (ref 96–112)
Creatinine, Ser: 0.68 mg/dL (ref 0.50–1.10)
GFR calc Af Amer: >90 mL/min (ref >90)
GFR calc non Af Amer: >90 mL/min (ref >90)
Glucose, Bld: 97 mg/dL (ref 70–99)
Potassium: 3.8 mEq/L (ref 3.7–5.3)
Sodium: 142 mEq/L (ref 137–147)
Total Bilirubin: 0.8 mg/dL (ref 0.3–1.2)
Total Protein: 6.1 g/dL (ref 6.0–8.3)

## 2014-02-12 NOTE — Discharge Summary (Signed)
This is a 23 year old gravida 6 para 3033 who had chronic hypertension and  Hyperthyroidism  during this pregnancy she had a repeat C-section and tubal ligation on June 2 and the patient was readmitted yesterday stating that for 4 days shortness of breath   but thought it was cold she had a chest x-ray which showed pulmonary edema a spiral CT which showed the same no sign of an embolus and she was admitted and diuresed well on Lasix 40 mg twice a day she was seen by her endocrinologist who took care of her hyperthyroidism in the past and the cardiologist saw and her echocardiogram was normal the patient feels fine her lungs are clear and she is  Cleared  for discharge today her blood pressure she's been taking Coreg 6.25 twice a day Lasix  20  mg daily lisinopril 10 mg daily and methimazole 60 mg by mouth daily she knows when her followup appointments are scheduled and then she will see me in 3 weeks

## 2014-02-12 NOTE — Progress Notes (Signed)
Subjective:  Feels much better - breathing better  Objective:  Vital Signs in the last 24 hours: Temp:  [97.4 F (36.3 C)-98.6 F (37 C)] 98 F (36.7 C) (06/23 1200) Pulse Rate:  [88-110] 99 (06/23 1350) Resp:  [20-40] 20 (06/23 1350) BP: (118-144)/(75-96) 144/96 mmHg (06/23 1003) SpO2:  [90 %-100 %] 98 % (06/23 1350) Weight:  [185 lb 14.4 oz (84.324 kg)] 185 lb 14.4 oz (84.324 kg) (06/23 1036)  Intake/Output from previous day: 06/22 0701 - 06/23 0700 In: 1535 [P.O.:1505; I.V.:30] Out: 2485 [Urine:2485] Intake/Output from this shift: Total I/O In: 400 [P.O.:400] Out: 60 [Urine:60]  Physical Exam: General appearance: alert, cooperative, appears stated age, no distress and pleasant mood & affect Neck: no adenopathy, no carotid bruit, no JVD and supple, symmetrical, trachea midline Lungs: clear to auscultation bilaterally and normal percussion bilaterally Heart: S1, S2 normal, S4 present and no M/R; unable to palpate PMI Abdomen: soft, non-tender; bowel sounds normal; no masses,  no organomegaly Extremities: extremities normal, atraumatic, no cyanosis or edema Pulses: 2+ and symmetric Neurologic: Grossly normal  Lab Results:  Recent Labs  02/11/14 0059  WBC 8.5  HGB 9.6*  PLT 393    Recent Labs  02/11/14 0950 02/12/14 0520  NA 141 142  K 4.0 3.8  CL 101 104  CO2 25 26  GLUCOSE 105* 97  BUN 6 8  CREATININE 0.70 0.68    Recent Labs  02/11/14 0059  TROPONINI <0.30   Hepatic Function Panel  Recent Labs  02/12/14 0520  PROT 6.1  ALBUMIN 2.6*  AST 24  ALT 32  ALKPHOS 223*  BILITOT 0.8   No results found for this basename: CHOL,  in the last 72 hours No results found for this basename: PROTIME,  in the last 72 hours  Imaging:  Cardiac Studies: Echo - EF 40-45%, dilated LV with mild LVH  Assessment/Plan:  Principal Problem:   Acute combined systolic and diastolic heart failure Active Problems:   Cardiomyopathy, peripartum, delivered  Essential hypertension, malignant   Essential hypertension   Chest pain  New Dx of Cardiomyopathy -- partially post-partum & related to preclampsia/ HTN. Diuresed well o/n with PO lasix & afterload reduction.  Is essentially ready for d/c from a clinical standpoint, will ill need additional cardiac evaluation as OP - per Dr. Eden Emms (we will arrange f/u).  Would convert BB to Carvedilol 6.25 mg bid (as she will not be breast feeding) along with ACE-I @ current dose  (Lisinopril 10mg ) Cut Lasix to 20 mg daily as standing dose -- recommend sliding scale Lasix dosing.  Sliding scale Lasix: Weigh yourself when you get home, then Daily in the Morning. Your dry weight will be what your scale says on the day you return home.(here is 185 lbs.).   If you gain more than 3 pounds from dry weight: Increase the Lasix dosing to 20 mg mg twice a day until weight returns to baseline dry weight.  If weight gain is greater than 5 pounds in 2 days: Increased to Lasix 40 in the morning and 20 mg in the afternoon and contact the office for further assistance if weight does not go down the next day.  If the weight goes down more than 3 pounds from dry weight: Hold Lasix until it returns to baseline dry weight (pls add to d/c instructions)    LOS: 1 day    Marykay Lex, M.D., M.S. Interventional Cardiologist   Pager # [phone] 02/12/2014

## 2014-02-12 NOTE — Progress Notes (Signed)
Pt discharged home after receiving instructions & prescriptions with husband & children

## 2014-02-12 NOTE — Progress Notes (Signed)
UR chart review completed.  

## 2014-02-12 NOTE — Progress Notes (Signed)
Patient ID: Marilyn Norman, female   DOB: 04/22/1991, 23 y.o.   MRN: [id number] Blood pressure is normal today Patient feels much better Lungs clear Abdomen negative awaiting endocrine consult  Re  her hyperthyroidism and   once cleared patient can be discharged

## 2014-02-12 NOTE — Discharge Instructions (Signed)
Sliding scale Lasix: Weigh yourself when you get home, then Daily in the Morning. Your dry weight will be what your scale says on the day you return home.(here is 185 lbs.).   If you gain more than 3 pounds from dry weight: Increase the Lasix dosing to 20 mg mg twice a day until weight returns to baseline dry weight.  If weight gain is greater than 5 pounds in 2 days: Increased to Lasix 40 in the morning and 20 mg in the afternoon and contact the office for further assistance if weight does not go down the next day.  If the weight goes down more than 3 pounds from dry weight: Hold Lasix until it returns to baseline dry weight (pls add to d/c instructions)Discharge instructions   You can wash your hair  Shower  Eat what you want  Drink what you want  See me in 6 weeks  Your ankles are going to swell more in the next 2 weeks than when pregnant  No sex for 6 weeks   Marilyn Norman A, MD 02/12/2014

## 2014-02-12 NOTE — Progress Notes (Signed)
Brief Cardiology d/u.  Chart reviewed - still waiting of Echo to be done & read.  Will need this data for final recommendations prior to d/c.  Per primary note - seems to be doing better with BP control & low dose diuretic.  BP looks better & diuresed well.  Will need to f/u with Dr. Eden Emms @ [facility] - [address] after d/c.  Will recheck for Echo results ~12-1PM - will see pt when this data is available.  Marykay Lex, M.D., M.S. Interventional Cardiologist   Pager # [phone] 02/12/2014

## 2014-02-13 LAB — URINE CULTURE

## 2014-02-20 ENCOUNTER — Ambulatory Visit: Payer: Medicaid Other | Admitting: Nurse Practitioner

## 2014-03-18 ENCOUNTER — Encounter: Payer: Medicaid Other | Admitting: Physician Assistant

## 2014-03-18 NOTE — Progress Notes (Signed)
This encounter was created in error - please disregard.

## 2014-03-18 NOTE — Progress Notes (Deleted)
   Cardiology Office Note    Date:  03/18/2014   ID:  Marilyn Norman, DOB 05-20-1991, MRN [id number]  PCP:  Ihor Gully  Cardiologist:  Dr. Charlton Haws   Electrophysiologist:  ***   History of Present Illness: Marilyn Norman is a 23 y.o. female with a hx of HTN, hyperthyroidism, pre-eclampsia.  Patient had delivered by C-section a healthy female in 6/2. She was readmitted several weeks later with congestive heart failure.  Echo demonstrated reduced LV function with an EF of 40-45%. 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 She was started on beta blocker and ACE inhibitor. She was diuresed with Lasix. She returns for follow up.  ***   Studies:  - Echo (8/13):  EF 55-60%  - Echo (02/11/14):  Mild LVH, EF 40-45%, trivial AI, moderate LAE, PASP 33 mm Hg, trivial effusion  - ETT-Echo (2013):  Notes indicate neg ETT Echo in 2013 - report not located    Recent Labs: 02/11/2014: Hemoglobin 9.6*; Pro B Natriuretic peptide (BNP) 4338.0*; TSH 0.009*  02/12/2014: ALT 32; Creatinine 0.68; Potassium 3.8   Wt Readings from Last 3 Encounters:  02/12/14 185 lb 14.4 oz (84.324 kg)  01/22/14 205 lb (92.987 kg)  01/22/14 205 lb (92.987 kg)     Past Medical History  Diagnosis Date  . Chest pain   . Cardiomegaly   . Costochondritis   . Thyroid disease     hyperthyroid  . Hyperthyroidism   . Chlamydia   . Trichomonas infection   . Hypertension   . Pre-eclampsia     with each pregnancy  . Graves' eye disease     Current Outpatient Prescriptions  Medication Sig Dispense Refill  . methimazole (TAPAZOLE) 10 MG tablet Take 10 mg by mouth 3 (three) times daily.       No current facility-administered medications for this visit.    Allergies:   Strawberry   Social History:  The patient  reports that she quit smoking about 4 months ago. She does not have any smokeless tobacco history on file. She reports that she uses illicit drugs  (Marijuana). She reports that she does not drink alcohol.   Family History:  The patient's family history includes Asthma in her mother; Heart disease in her father.   ROS:  Please see the history of present illness.   ***   All other systems reviewed and negative.   PHYSICAL EXAM: VS:  There were no vitals taken for this visit. Well nourished, well developed, in no acute distress HEENT: normal Neck: ***no JVD Cardiac:  normal S1, S2; ***RRR; no murmur Lungs:  ***clear to auscultation bilaterally, no wheezing, rhonchi or rales Abd: soft, nontender, no hepatomegaly Ext: ***no edema Skin: warm and dry Neuro:  CNs 2-12 intact, no focal abnormalities noted  EKG:  ***     ASSESSMENT AND PLAN:  No diagnosis found.    Disposition:  ***   Signed, Brynda Rim, MHS 03/18/2014 8:37 AM    [facility] [address] [place], [place], [place]  [address] Phone: [phone]; Fax: [phone]

## 2014-06-02 IMAGING — CR DG CHEST 2V
2 series · 2 of 2 positions shown · non-contrast
Comparison: None.

CLINICAL DATA: 3-day history of left-sided chest pain.  Smoker.

CHEST - 2 VIEW

[w chest pa *]
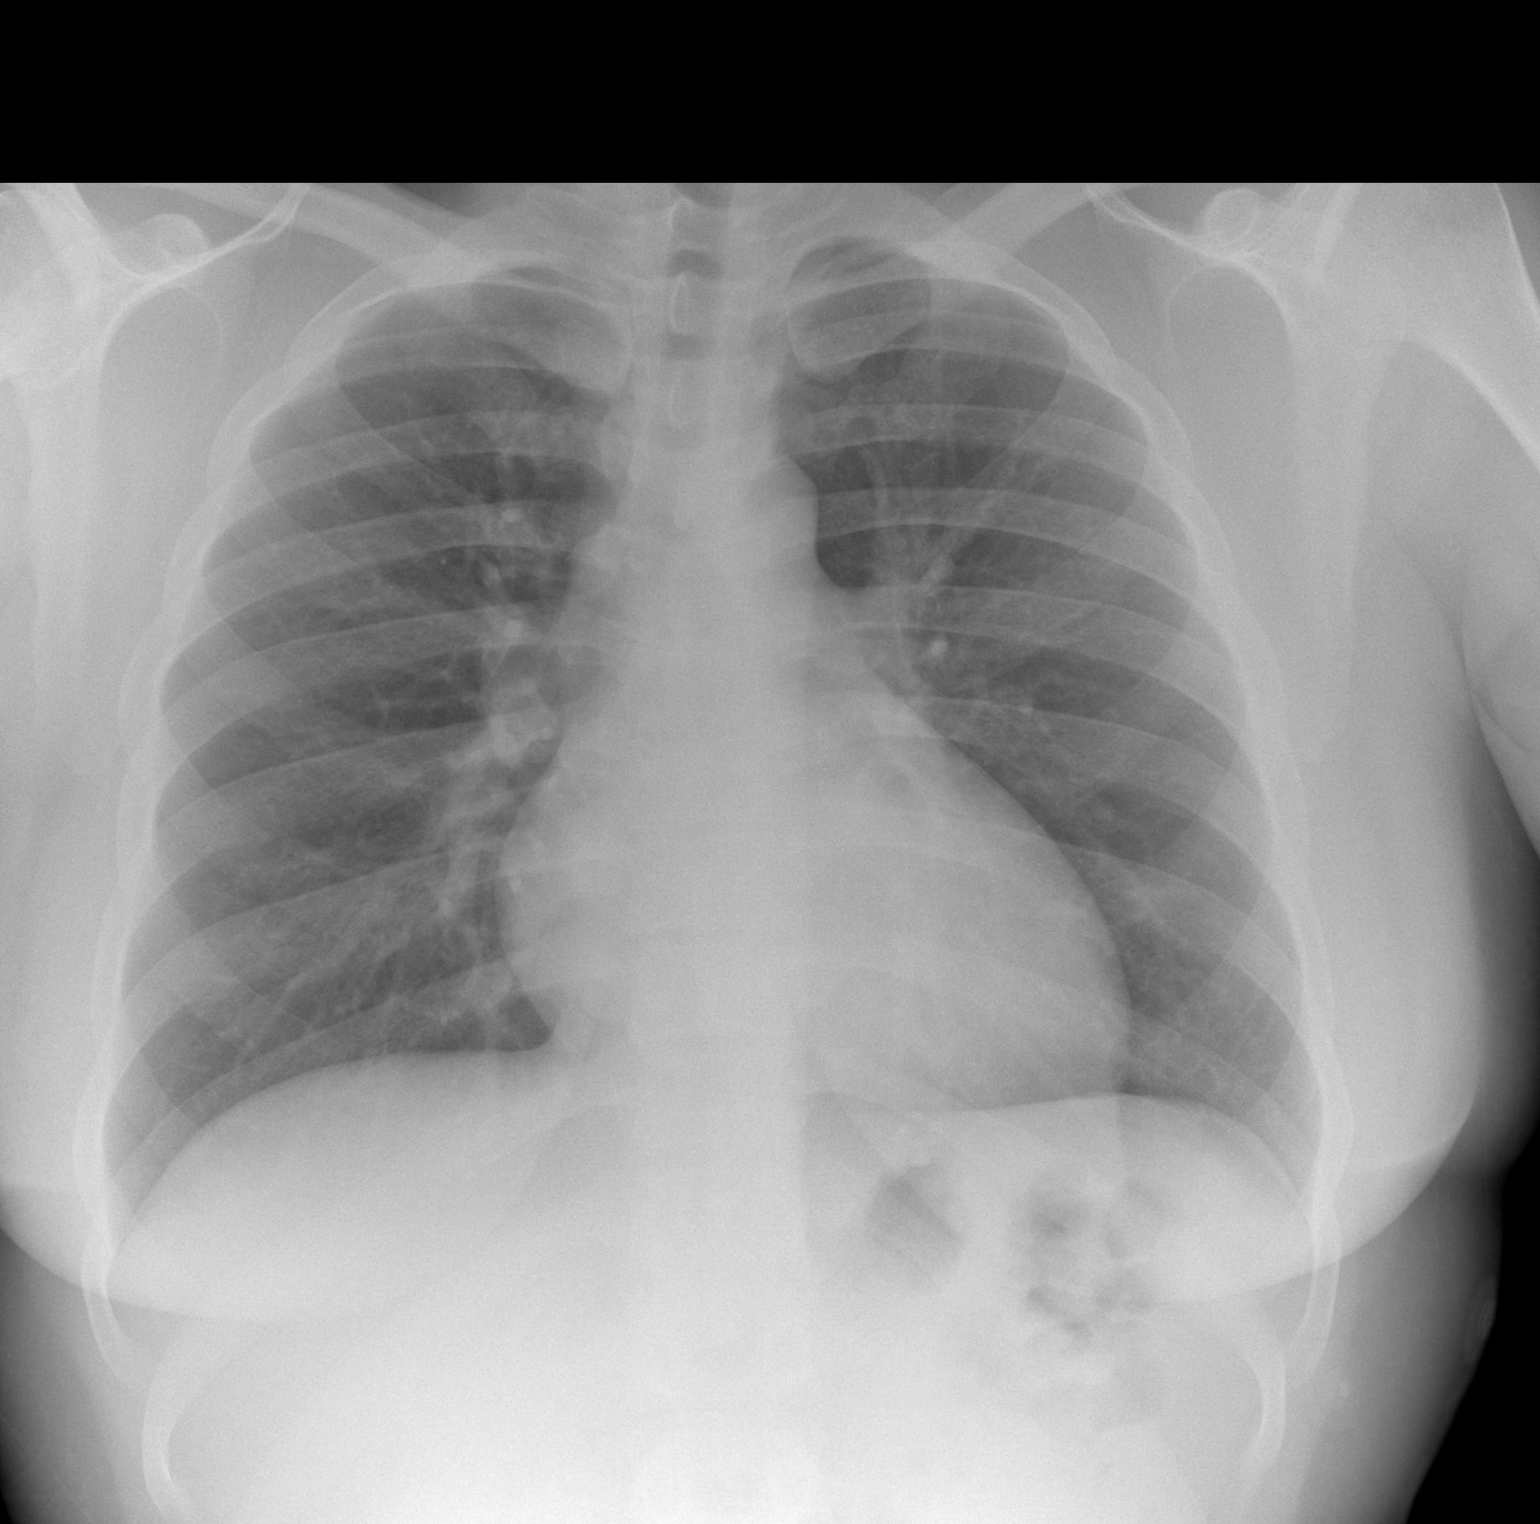

[w chest lat *]
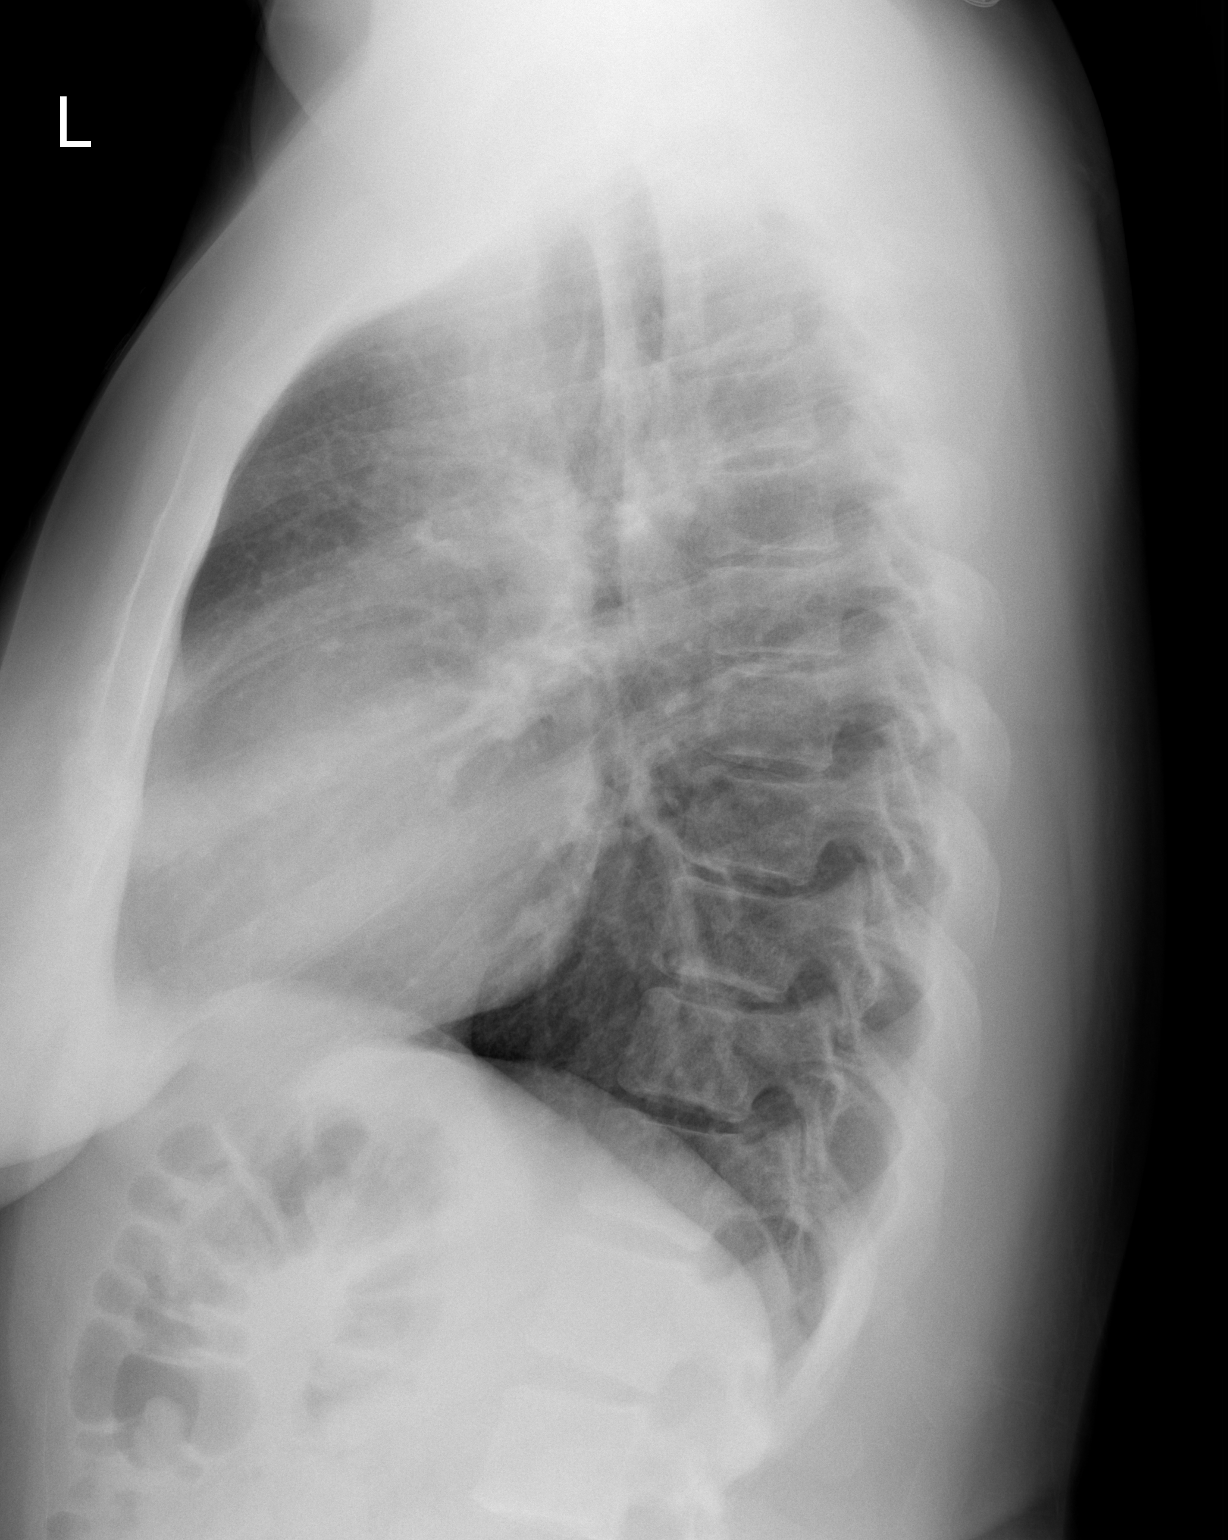

[2 of 2 positions shown; findings below may reference images not displayed]

FINDINGS: Heart size upper normal to slightly enlarged.  Hilar and
mediastinal contours otherwise unremarkable.  Lungs clear.
Bronchovascular markings normal.  Pulmonary vascularity normal.  No
pneumothorax.  No pleural effusions.  Visualized bony thorax
intact.
IMPRESSION: Borderline heart size.  No acute cardiopulmonary disease.

## 2014-06-24 ENCOUNTER — Encounter (HOSPITAL_COMMUNITY): Payer: Self-pay | Admitting: Emergency Medicine

## 2014-12-22 ENCOUNTER — Encounter (HOSPITAL_COMMUNITY): Payer: Self-pay

## 2014-12-22 ENCOUNTER — Emergency Department (HOSPITAL_COMMUNITY)
Admission: EM | Admit: 2014-12-22 | Discharge: 2014-12-22 | Disposition: A | Discharge status: Home / Self Care | Payer: Medicaid Other | Attending: Emergency Medicine | Admitting: Emergency Medicine

## 2014-12-22 DIAGNOSIS — E0590 Thyrotoxicosis, unspecified without thyrotoxic crisis or storm: Secondary | ICD-10-CM | POA: Diagnosis not present

## 2014-12-22 DIAGNOSIS — Z8619 Personal history of other infectious and parasitic diseases: Secondary | ICD-10-CM | POA: Diagnosis not present

## 2014-12-22 DIAGNOSIS — Z79899 Other long term (current) drug therapy: Secondary | ICD-10-CM | POA: Insufficient documentation

## 2014-12-22 DIAGNOSIS — R000 Tachycardia, unspecified: Secondary | ICD-10-CM | POA: Diagnosis not present

## 2014-12-22 DIAGNOSIS — I10 Essential (primary) hypertension: Secondary | ICD-10-CM | POA: Diagnosis not present

## 2014-12-22 DIAGNOSIS — R21 Rash and other nonspecific skin eruption: Secondary | ICD-10-CM | POA: Diagnosis present

## 2014-12-22 DIAGNOSIS — Z87891 Personal history of nicotine dependence: Secondary | ICD-10-CM | POA: Diagnosis not present

## 2014-12-22 MED ORDER — PERMETHRIN 5 % EX CREA: 1.0000 "application " | TOPICAL_CREAM | Freq: Once | CUTANEOUS | Status: DC

## 2014-12-22 MED ORDER — TRIAMCINOLONE ACETONIDE 0.025 % EX LOTN: 1.0000 "application " | TOPICAL_LOTION | Freq: Two times a day (BID) | CUTANEOUS | Status: DC

## 2014-12-22 MED ORDER — TRIAMCINOLONE ACETONIDE 0.025 % EX OINT: 1.0000 "application " | TOPICAL_OINTMENT | Freq: Two times a day (BID) | CUTANEOUS | Status: DC

## 2014-12-22 MED ORDER — PREDNISONE 20 MG PO TABS: ORAL_TABLET | ORAL | Status: DC

## 2014-12-22 NOTE — ED Notes (Signed)
Onset 2 days widespread small red bumps that itch.  No new foods, meds, products.  No one in household with rash.

## 2014-12-22 NOTE — ED Provider Notes (Signed)
CSN: [id number]     Arrival date & time 12/22/14  1337 History  This chart was scribed for non-physician practitioner, Arthor Captain, working with Rolan Bucco, MD by Richarda Overlie, ED Scribe. This patient was seen in room TR07C/TR07C and the patient's care was started at 2:33 PM.    Chief Complaint  Patient presents with  . Rash   The history is provided by the patient. No language interpreter was used.   HPI Comments: ALIYANNAH Norman is a 24 y.o. female with a history of hyperthyroidism and HTN who presents to the Emergency Department complaining of generalized rash that started 2 days ago. Pt states she has the rash on her back, chest and arms. Pt states that she has widespread small red bumps with associated itching. She reports that she has not recently changed detergents or other cleaning products. Pt reports no history of similar prior episodes. She states that no other family members have similar symptoms. Pt denies rash on her palms. She reports no alleviating or exacerbating symptoms at this time.   Past Medical History  Diagnosis Date  . Chest pain   . Cardiomegaly   . Costochondritis   . Thyroid disease     hyperthyroid  . Hyperthyroidism   . Chlamydia   . Trichomonas infection   . Hypertension   . Pre-eclampsia     with each pregnancy  . Graves' eye disease    Past Surgical History  Procedure Laterality Date  . Cesarean section    . Iud removal    . Cesarean section with bilateral tubal ligation N/A 01/22/2014    Procedure: REPEAT CESAREAN SECTION WITH BILATERAL TUBAL LIGATION;  Surgeon: Kathreen Cosier, MD;  Location: [facility];  Service: Obstetrics;  Laterality: N/A;   Family History  Problem Relation Age of Onset  . Asthma Mother   . Heart disease Father    History  Substance Use Topics  . Smoking status: Former Smoker    Quit date: 10/19/2013  . Smokeless tobacco: Not on file  . Alcohol Use: No   OB History    Gravida Para Term Preterm AB TAB SAB  Ectopic Multiple Living   6 3 3  3  3   3      Review of Systems  Constitutional: Negative for fever.  Skin: Positive for rash. Negative for wound.    Allergies  Strawberry  Home Medications   Prior to Admission medications   Medication Sig Start Date End Date Taking? Authorizing Provider  methimazole (TAPAZOLE) 10 MG tablet Take 10 mg by mouth 3 (three) times daily.    Historical Provider, MD   BP 181/97 mmHg  Pulse 101  Temp(Src) 98 F (36.7 C) (Oral)  Resp 17  Ht 5\' 4"  (1.626 m)  Wt 186 lb 4.8 oz (84.505 kg)  BMI 31.96 kg/m2  SpO2 98%  LMP 12/15/2014   Physical Exam  Constitutional: She is oriented to person, place, and time. She appears well-developed and well-nourished.  HENT:  Head: Normocephalic and atraumatic.  Eyes: Right eye exhibits no discharge. Left eye exhibits no discharge.  Neck: No tracheal deviation present.  Cardiovascular: Normal rate.   Pulmonary/Chest: Effort normal. No respiratory distress.  Abdominal: She exhibits no distension.  Neurological: She is alert and oriented to person, place, and time.  Skin: Skin is warm and dry. Rash noted.  Singular, papules that crusted with excoriation. Palmar erythema. No lesions are present. No intradigital lesions.  Psychiatric: She has a normal mood and  affect. Her behavior is normal.  Nursing note and vitals reviewed.   ED Course  Procedures   DIAGNOSTIC STUDIES: Oxygen Saturation is 98% on RA, normal by my interpretation.    COORDINATION OF CARE: 2:36 PM Discussed treatment plan with pt at bedside and pt agreed to plan.   Labs Review Labs Reviewed - No data to display  Imaging Review No results found.   EKG Interpretation None      MDM   Final diagnoses:  Rash  Essential hypertension    .patient with rash. Patient tachycardic at initial presentation. States that she was nervous. Will treat with prednisone and topical steroid. No others in the home have the rash, however i have given  the patient elemite to use if others have it as the appearance is similar to scabies. No signs of secondary infection.      Arthor Captain, PA-C 12/23/14 2102  Rolan Bucco, MD 12/23/14 [phone]

## 2014-12-22 NOTE — Discharge Instructions (Signed)
Rash A rash is a change in the color or texture of your skin. There are many different types of rashes. You may have other problems that accompany your rash. CAUSES   Infections.  Allergic reactions. This can include allergies to pets or foods.  Certain medicines.  Exposure to certain chemicals, soaps, or cosmetics.  Heat.  Exposure to poisonous plants.  Tumors, both cancerous and noncancerous. SYMPTOMS   Redness.  Scaly skin.  Itchy skin.  Dry or cracked skin.  Bumps.  Blisters.  Pain. DIAGNOSIS  Your caregiver may do a physical exam to determine what type of rash you have. A skin sample (biopsy) may be taken and examined under a microscope. TREATMENT  Treatment depends on the type of rash you have. Your caregiver may prescribe certain medicines. For serious conditions, you may need to see a skin doctor (dermatologist). HOME CARE INSTRUCTIONS   Avoid the substance that caused your rash.  Do not scratch your rash. This can cause infection.  You may take cool baths to help stop itching.  Only take over-the-counter or prescription medicines as directed by your caregiver.  Keep all follow-up appointments as directed by your caregiver. SEEK IMMEDIATE MEDICAL CARE IF:  You have increasing pain, swelling, or redness.  You have a fever.  You have new or severe symptoms.  You have body aches, diarrhea, or vomiting.  Your rash is not better after 3 days. MAKE SURE YOU:  Understand these instructions.  Will watch your condition.  Will get help right away if you are not doing well or get worse. Document Released: 07/30/2002 Document Revised: 11/01/2011 Document Reviewed: 05/24/2011 [facility] Patient Information 2015 [place], [place]. This information is not intended to replace advice given to you by your health care provider. Make sure you discuss any questions you have with your health care provider.  Hypertension Hypertension, commonly called high blood  pressure, is when the force of blood pumping through your arteries is too strong. Your arteries are the blood vessels that carry blood from your heart throughout your body. A blood pressure reading consists of a higher number over a lower number, such as 110/72. The higher number (systolic) is the pressure inside your arteries when your heart pumps. The lower number (diastolic) is the pressure inside your arteries when your heart relaxes. Ideally you want your blood pressure below 120/80. Hypertension forces your heart to work harder to pump blood. Your arteries may become narrow or stiff. Having hypertension puts you at risk for heart disease, stroke, and other problems.  RISK FACTORS Some risk factors for high blood pressure are controllable. Others are not.  Risk factors you cannot control include:   Race. You may be at higher risk if you are African American.  Age. Risk increases with age.  Gender. Men are at higher risk than women before age 73 years. After age 66, women are at higher risk than men. Risk factors you can control include:  Not getting enough exercise or physical activity.  Being overweight.  Getting too much fat, sugar, calories, or salt in your diet.  Drinking too much alcohol. SIGNS AND SYMPTOMS Hypertension does not usually cause signs or symptoms. Extremely high blood pressure (hypertensive crisis) may cause headache, anxiety, shortness of breath, and nosebleed. DIAGNOSIS  To check if you have hypertension, your health care provider will measure your blood pressure while you are seated, with your arm held at the level of your heart. It should be measured at least twice using the same  Extremely high blood pressure (hypertensive crisis) may cause headache, anxiety, shortness of breath, and nosebleed.  DIAGNOSIS   To check if you have hypertension, your health care provider will measure your blood pressure while you are seated, with your arm held at the level of your heart. It should be measured at least twice using the same arm. Certain conditions can cause a difference in blood pressure between your right and left arms. A blood pressure reading that is higher than normal on one occasion does not mean that you need treatment. If one blood pressure reading is high, ask your health care provider about having it  checked again.  TREATMENT   Treating high blood pressure includes making lifestyle changes and possibly taking medicine. Living a healthy lifestyle can help lower high blood pressure. You may need to change some of your habits.  Lifestyle changes may include:   Following the DASH diet. This diet is high in fruits, vegetables, and whole grains. It is low in salt, red meat, and added sugars.   Getting at least 2 hours of brisk physical activity every week.   Losing weight if necessary.   Not smoking.   Limiting alcoholic beverages.   Learning ways to reduce stress.  If lifestyle changes are not enough to get your blood pressure under control, your health care provider may prescribe medicine. You may need to take more than one. Work closely with your health care provider to understand the risks and benefits.  HOME CARE INSTRUCTIONS   Have your blood pressure rechecked as directed by your health care provider.    Take medicines only as directed by your health care provider. Follow the directions carefully. Blood pressure medicines must be taken as prescribed. The medicine does not work as well when you skip doses. Skipping doses also puts you at risk for problems.    Do not smoke.    Monitor your blood pressure at home as directed by your health care provider.  SEEK MEDICAL CARE IF:    You think you are having a reaction to medicines taken.   You have recurrent headaches or feel dizzy.   You have swelling in your ankles.   You have trouble with your vision.  SEEK IMMEDIATE MEDICAL CARE IF:   You develop a severe headache or confusion.   You have unusual weakness, numbness, or feel faint.   You have severe chest or abdominal pain.   You vomit repeatedly.   You have trouble breathing.  MAKE SURE YOU:    Understand these instructions.   Will watch your condition.   Will get help right away if you are not doing well or get worse.  Document Released: 08/09/2005 Document Revised: 12/24/2013  Document Reviewed: 06/01/2013  [employer] Patient Information 2015 [employer]. This information is not intended to replace advice given to you by your health care provider. Make sure you discuss any questions you have with your health care provider.

## 2015-03-18 ENCOUNTER — Emergency Department (HOSPITAL_COMMUNITY)
Admission: EM | Admit: 2015-03-18 | Discharge: 2015-03-18 | Discharge status: Against Medical Advice | Payer: Medicaid Other | Attending: Emergency Medicine | Admitting: Emergency Medicine

## 2015-03-18 ENCOUNTER — Encounter (HOSPITAL_COMMUNITY): Payer: Self-pay | Admitting: *Deleted

## 2015-03-18 DIAGNOSIS — R0602 Shortness of breath: Secondary | ICD-10-CM | POA: Insufficient documentation

## 2015-03-18 DIAGNOSIS — R609 Edema, unspecified: Secondary | ICD-10-CM | POA: Diagnosis not present

## 2015-03-18 DIAGNOSIS — I10 Essential (primary) hypertension: Secondary | ICD-10-CM | POA: Insufficient documentation

## 2015-03-18 NOTE — ED Notes (Signed)
Pt states that she awoke with a sore throat. States that she has hyperthyroid and her thyroid is much more swollen than usual. States that it is hard to breath. States that she has been taking her medications. Pts voice is raspy and large swollen area noted to front of neck.

## 2015-03-18 NOTE — ED Notes (Signed)
Called for room in back, no answer in lobby

## 2015-03-18 NOTE — ED Notes (Signed)
Called for room, no answer in lobby  

## 2015-04-08 ENCOUNTER — Emergency Department (HOSPITAL_COMMUNITY)
Admission: EM | Admit: 2015-04-08 | Discharge: 2015-04-08 | Disposition: A | Discharge status: Home / Self Care | Payer: Medicaid Other | Attending: Emergency Medicine | Admitting: Emergency Medicine

## 2015-04-08 ENCOUNTER — Encounter (HOSPITAL_COMMUNITY): Payer: Self-pay | Admitting: Neurology

## 2015-04-08 DIAGNOSIS — Y9389 Activity, other specified: Secondary | ICD-10-CM | POA: Insufficient documentation

## 2015-04-08 DIAGNOSIS — G44319 Acute post-traumatic headache, not intractable: Secondary | ICD-10-CM

## 2015-04-08 DIAGNOSIS — Z8639 Personal history of other endocrine, nutritional and metabolic disease: Secondary | ICD-10-CM | POA: Insufficient documentation

## 2015-04-08 DIAGNOSIS — S0990XA Unspecified injury of head, initial encounter: Secondary | ICD-10-CM | POA: Diagnosis present

## 2015-04-08 DIAGNOSIS — Y998 Other external cause status: Secondary | ICD-10-CM | POA: Diagnosis not present

## 2015-04-08 DIAGNOSIS — S060X0A Concussion without loss of consciousness, initial encounter: Secondary | ICD-10-CM | POA: Insufficient documentation

## 2015-04-08 DIAGNOSIS — Z8739 Personal history of other diseases of the musculoskeletal system and connective tissue: Secondary | ICD-10-CM | POA: Insufficient documentation

## 2015-04-08 DIAGNOSIS — Y92410 Unspecified street and highway as the place of occurrence of the external cause: Secondary | ICD-10-CM | POA: Diagnosis not present

## 2015-04-08 DIAGNOSIS — Z87891 Personal history of nicotine dependence: Secondary | ICD-10-CM | POA: Insufficient documentation

## 2015-04-08 DIAGNOSIS — Z8619 Personal history of other infectious and parasitic diseases: Secondary | ICD-10-CM | POA: Insufficient documentation

## 2015-04-08 DIAGNOSIS — I10 Essential (primary) hypertension: Secondary | ICD-10-CM | POA: Insufficient documentation

## 2015-04-08 MED ORDER — HYDROCODONE-ACETAMINOPHEN 5-325 MG PO TABS
1.0000 | ORAL_TABLET | Freq: Once | ORAL | Status: AC
  Administered 2015-04-08: 1 via ORAL
  Filled 2015-04-08: qty 1

## 2015-04-08 MED ORDER — HYDROCODONE-ACETAMINOPHEN 5-325 MG PO TABS: 1.0000 | ORAL_TABLET | Freq: Four times a day (QID) | ORAL | Status: DC | PRN

## 2015-04-08 MED ORDER — NAPROXEN 500 MG PO TABS: 500.0000 mg | ORAL_TABLET | Freq: Two times a day (BID) | ORAL | Status: DC | PRN
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
## 2015-08-22 IMAGING — US US OB COMP LESS 14 WK
1 series · 14 of 20 positions shown · non-contrast
Comparison: Recent obstetrical ultrasound 10/31/2012

CLINICAL DATA: Pain, possible pregnancy

EXAM:
OBSTETRIC <14 WK ULTRASOUND
TECHNIQUE: Transabdominal ultrasound was performed for evaluation of the
gestation as well as the maternal uterus and adnexal regions.

[Series 1: us ob comp less 14 wk · 0.17mm/px · 14 of 20 slices shown]
[im 1/20]
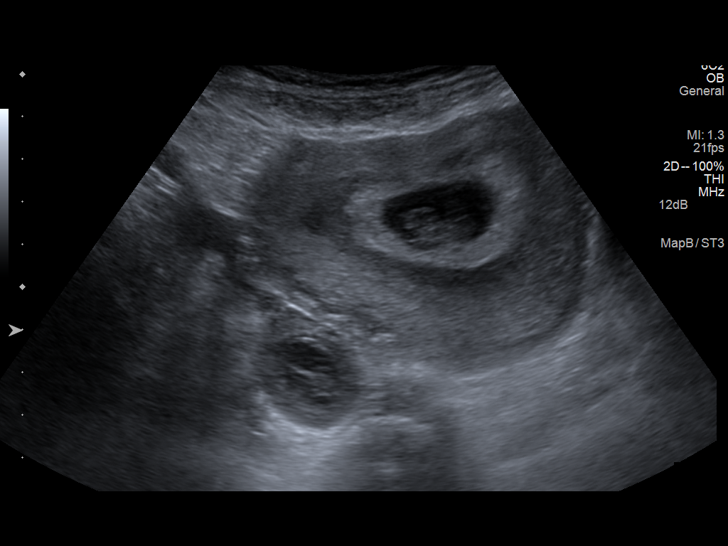
[im 3/20]
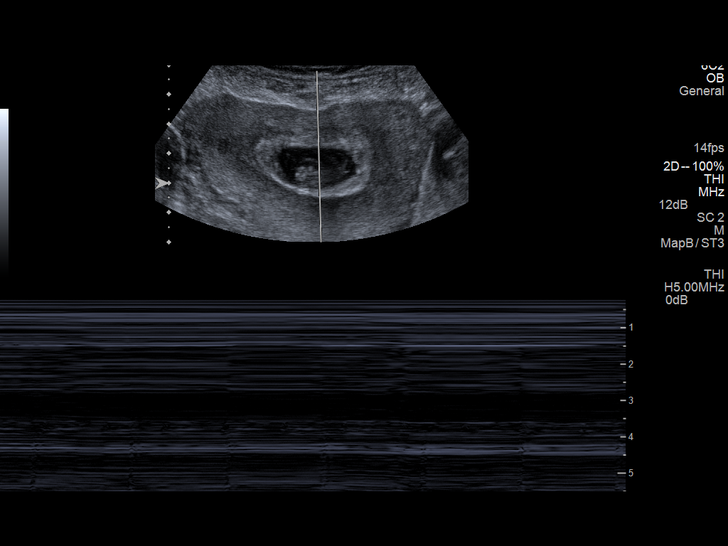
[im 4/20]
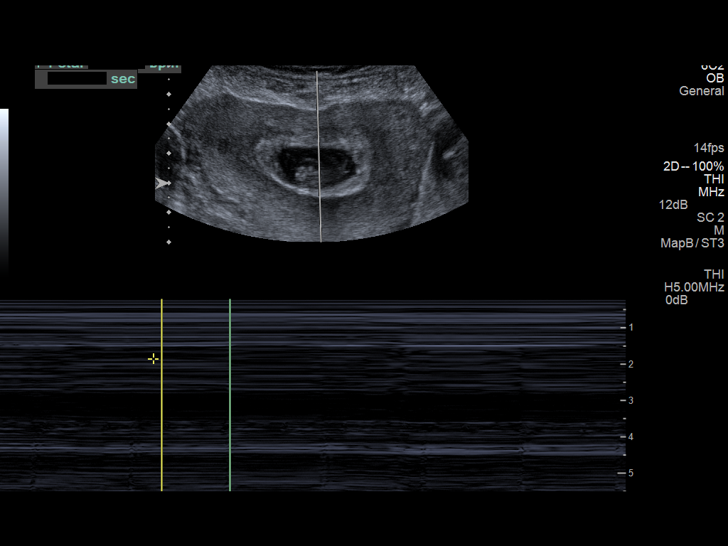
[im 6/20]
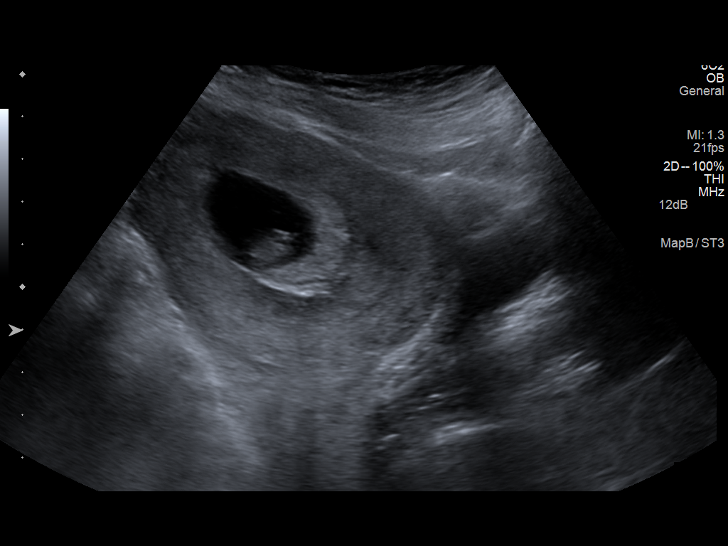
[im 7/20]
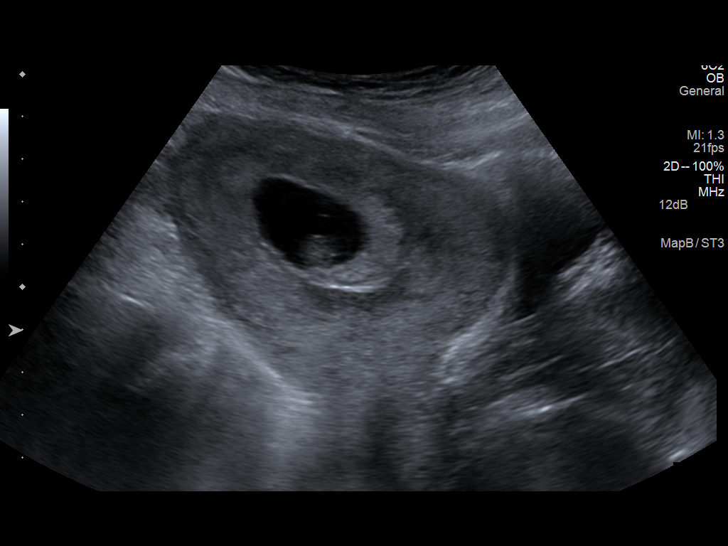
[im 8/20]
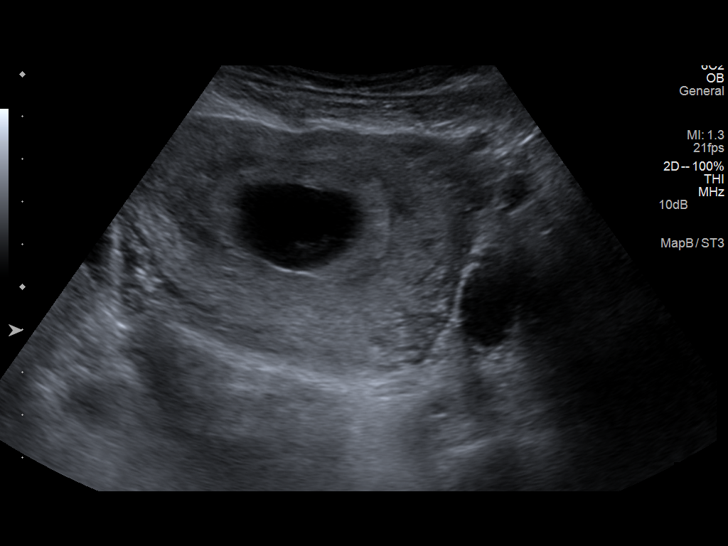
[im 10/20]
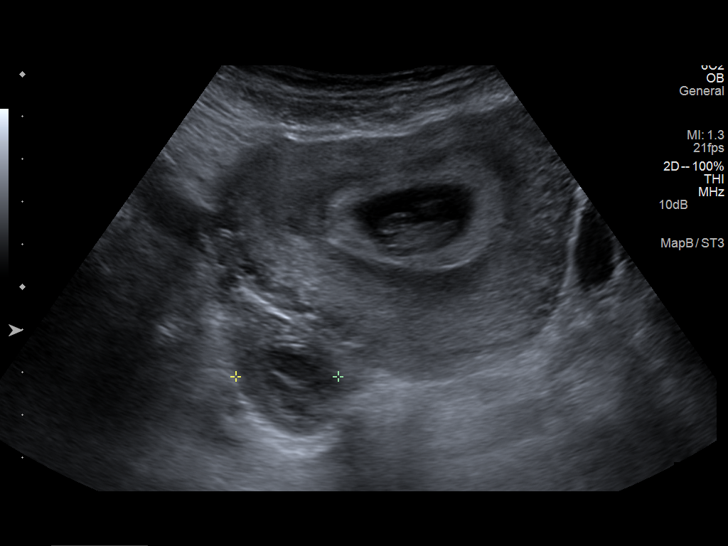
[im 11/20]
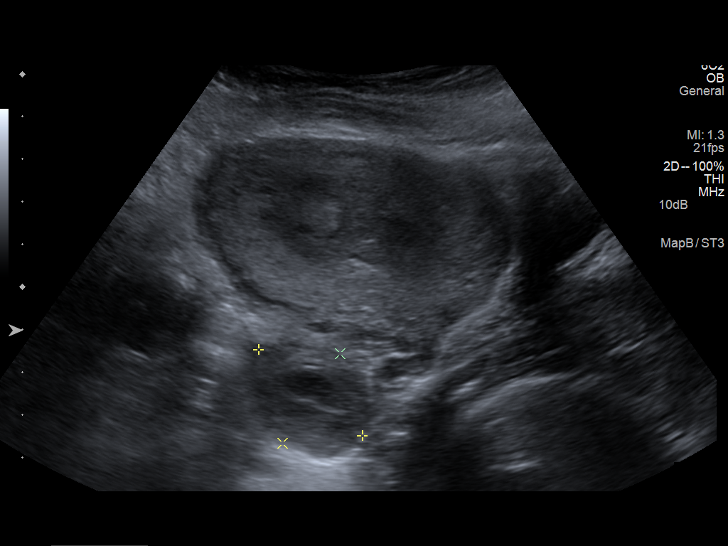
[im 13/20]
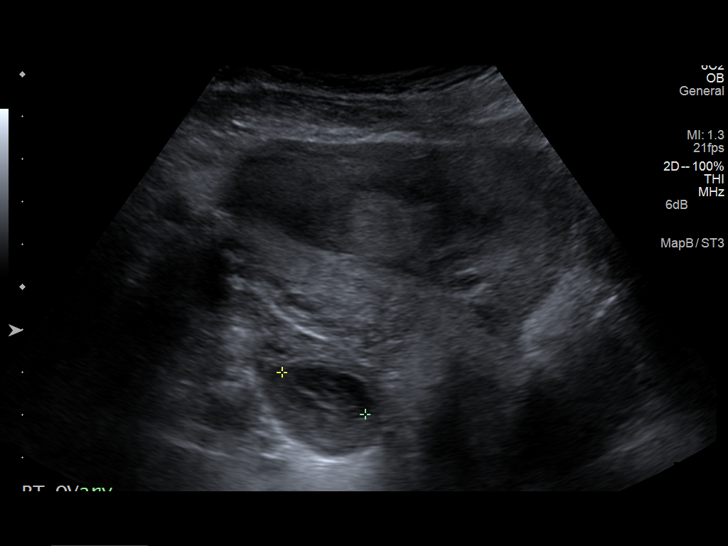
[im 14/20]
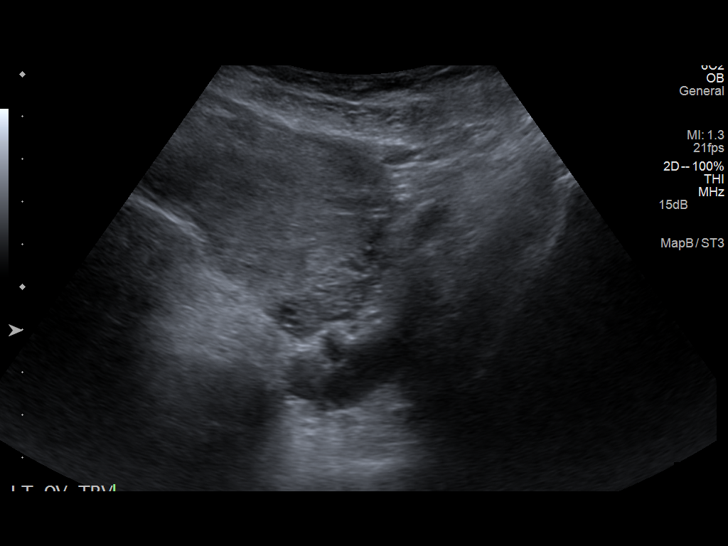
[im 16/20]
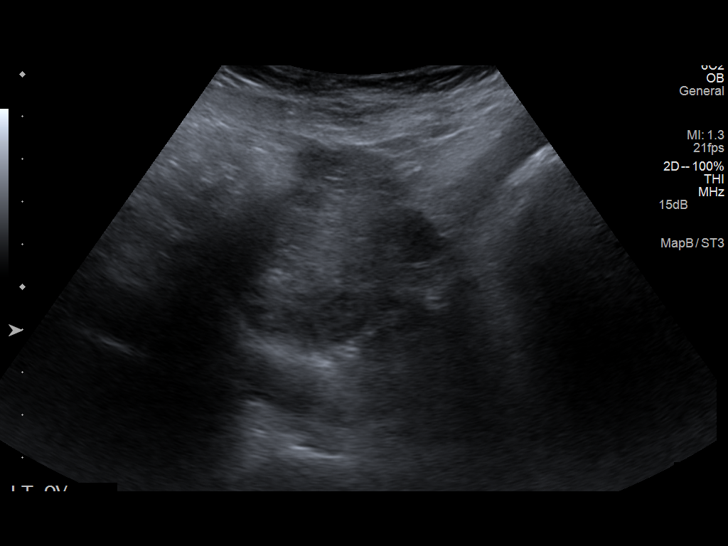
[im 17/20]
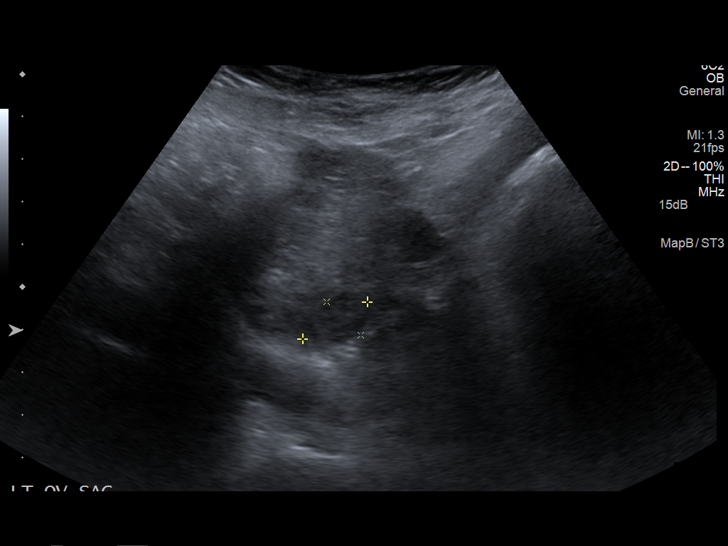
[im 18/20]
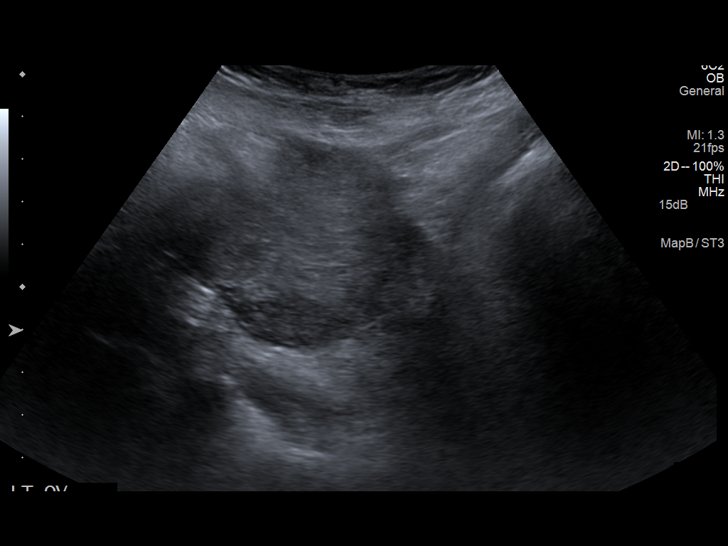
[im 20/20]
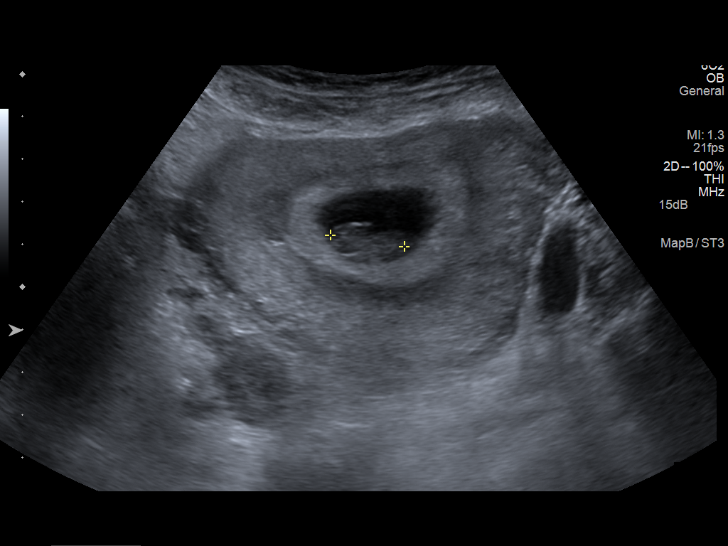

[14 of 20 positions shown; findings below may reference images not displayed]

FINDINGS: Intrauterine gestational sac: Normal single intrauterine gestational
sac.

Yolk sac:  Present

Embryo:  Present

Cardiac Activity: Yes

Heart Rate: 150 bpm

CRL:   17.6  mm   8 w to d                  US EDC: January 29, 2014

Maternal uterus/adnexae: Complex cyst affiliated with the right
ovary likely reflects a corpus luteum. No subchorionic hemorrhage
identified. The left ovary is unremarkable. No free fluid.
IMPRESSION: Single viable IUP.

Gestational age by crown-rump length is 8 weeks 2 days. The
estimated date of confinement is 01/29/2014. Fetal heart rate 150
beats per min.

## 2015-10-06 ENCOUNTER — Emergency Department (HOSPITAL_COMMUNITY)
Admission: EM | Admit: 2015-10-06 | Discharge: 2015-10-06 | Disposition: A | Discharge status: Home / Self Care | Payer: Medicaid Other | Attending: Emergency Medicine | Admitting: Emergency Medicine

## 2015-10-06 ENCOUNTER — Encounter (HOSPITAL_COMMUNITY): Payer: Self-pay | Admitting: Emergency Medicine

## 2015-10-06 DIAGNOSIS — R221 Localized swelling, mass and lump, neck: Secondary | ICD-10-CM | POA: Insufficient documentation

## 2015-10-06 DIAGNOSIS — R498 Other voice and resonance disorders: Secondary | ICD-10-CM | POA: Insufficient documentation

## 2015-10-06 DIAGNOSIS — I10 Essential (primary) hypertension: Secondary | ICD-10-CM | POA: Insufficient documentation

## 2015-10-06 LAB — BASIC METABOLIC PANEL
ANION GAP: 11 (ref 5–15)
BUN: 7 mg/dL (ref 6–20)
CO2: 23 mmol/L (ref 22–32)
Calcium: 9.3 mg/dL (ref 8.9–10.3)
Chloride: 107 mmol/L (ref 101–111)
Creatinine, Ser: 0.36 mg/dL — ABNORMAL LOW (ref 0.44–1.00)
GFR calc Af Amer: >60 mL/min (ref >60)
GLUCOSE: 118 mg/dL — AB (ref 65–99)
POTASSIUM: 3.6 mmol/L (ref 3.5–5.1)
Sodium: 141 mmol/L (ref 135–145)

## 2015-10-06 LAB — CBC
HEMATOCRIT: 36.7 % (ref 36.0–46.0)
Hemoglobin: 12.2 g/dL (ref 12.0–15.0)
MCH: 27.1 pg (ref 26.0–34.0)
MCHC: 33.2 g/dL (ref 30.0–36.0)
MCV: 81.6 fL (ref 78.0–100.0)
Platelets: 182 10*3/uL (ref 150–400)
RBC: 4.5 MIL/uL (ref 3.87–5.11)
RDW: 14.5 % (ref 11.5–15.5)
WBC: 6.1 10*3/uL (ref 4.0–10.5)

## 2015-10-06 LAB — TSH: TSH: 0.012 u[IU]/mL — ABNORMAL LOW (ref 0.350–4.500)

## 2015-10-06 NOTE — ED Notes (Signed)
No answer to go back to room

## 2015-10-06 NOTE — ED Notes (Signed)
Pt states she has hyperthyroidism and always has some swelling in her neck but for the last 3 days has had increased swelling in the neck most on the left side. pts voice is horse but able to swallow and reports no difficulty breathing.

## 2015-10-06 NOTE — ED Notes (Signed)
Called x 2 for triage. 

## 2016-03-12 IMAGING — US US OB LIMITED
1 series · 13 of 20 positions shown · non-contrast
Comparison: none

[Series 1: us ob limited · 0.28mm/px · 20 acquisitions, 13 frames shown]
[im 1/20]
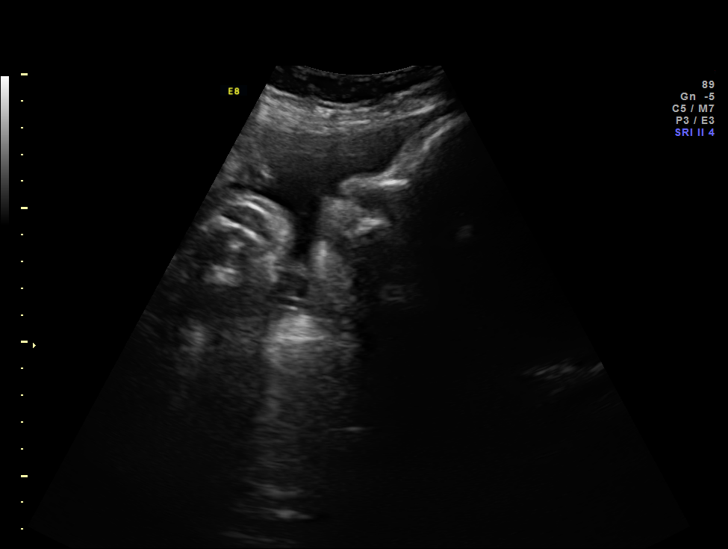
[im 3/20]
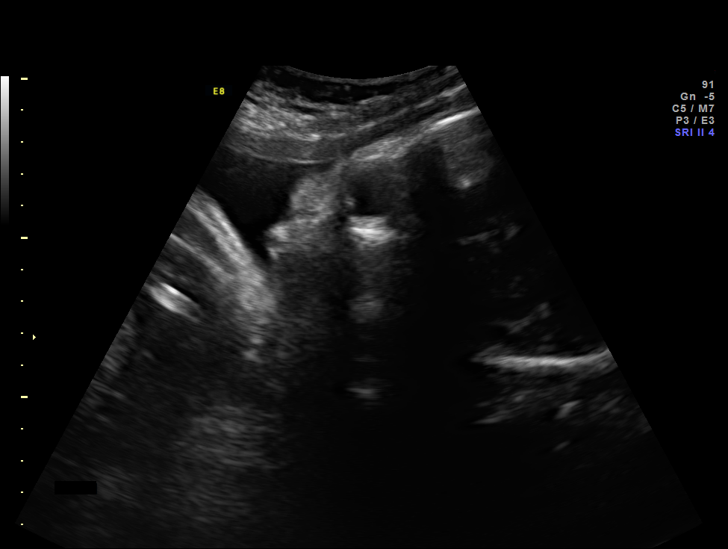
[im 4/20]
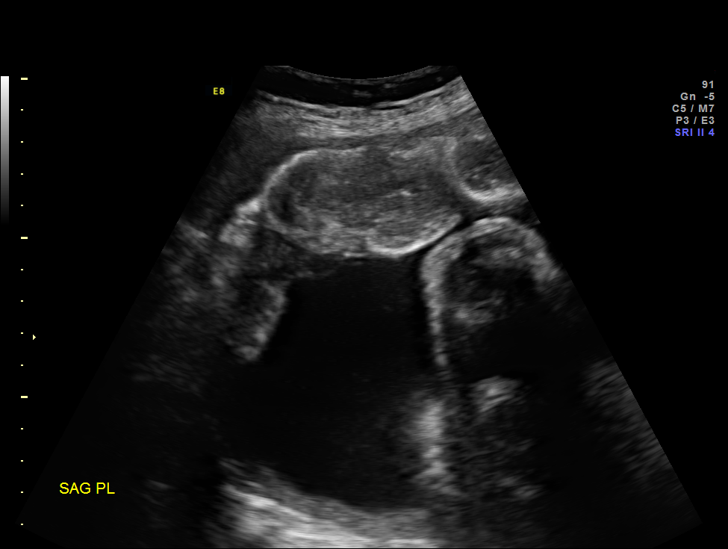
[im 6/20]
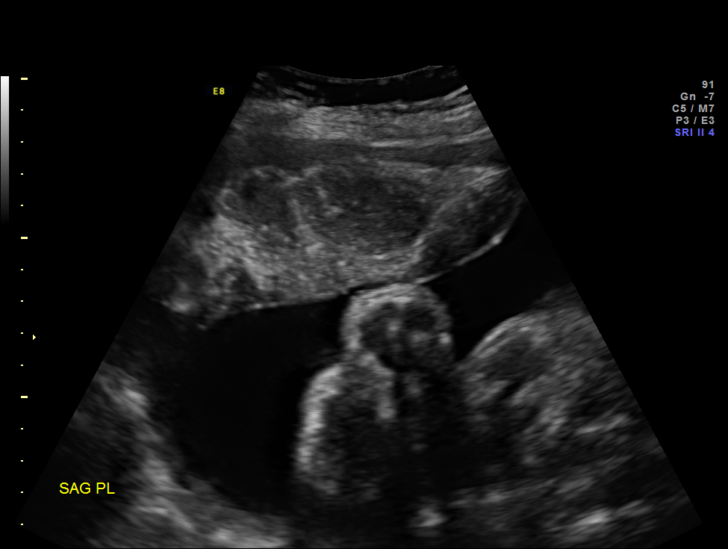
[im 7/20]
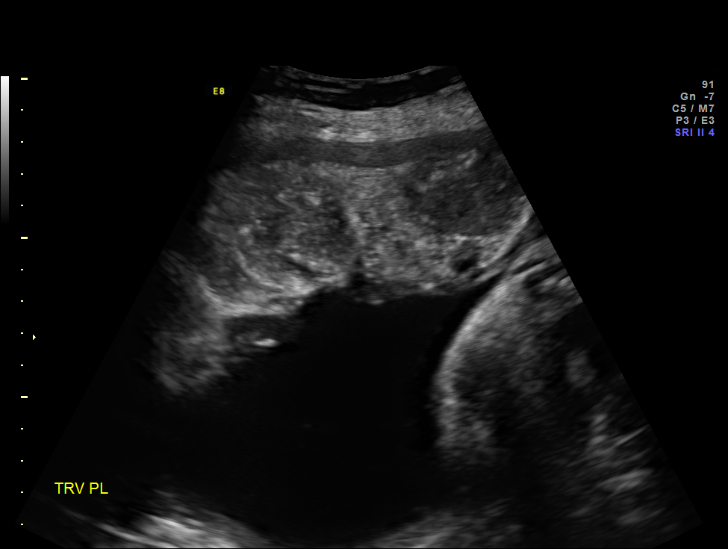
[im 9/20]
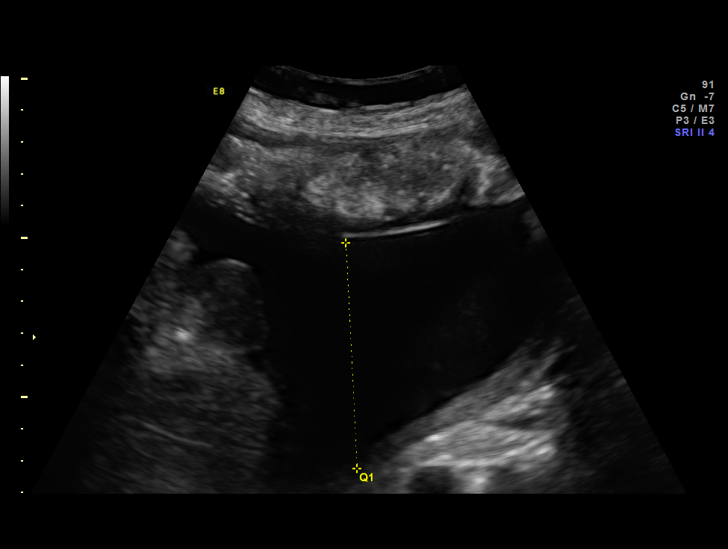
[im 11/20]
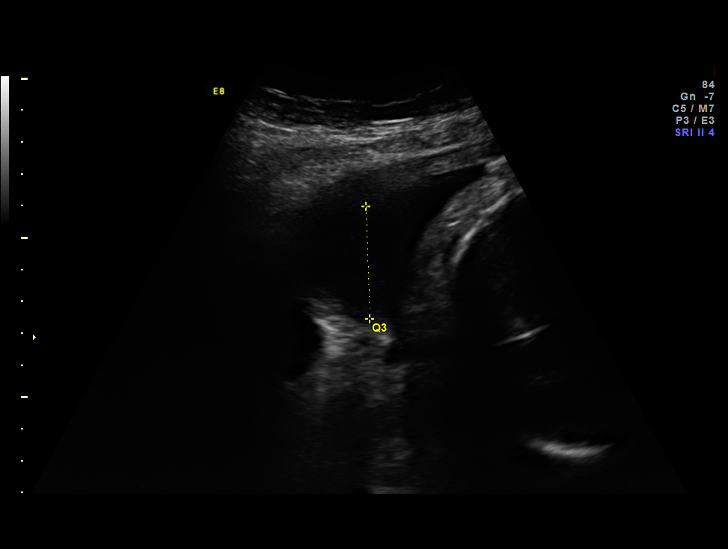
[im 12/20]
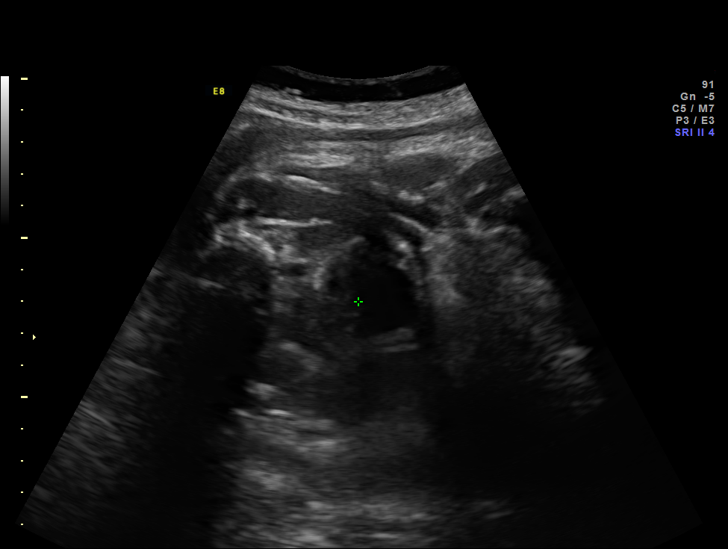
[im 14/20]
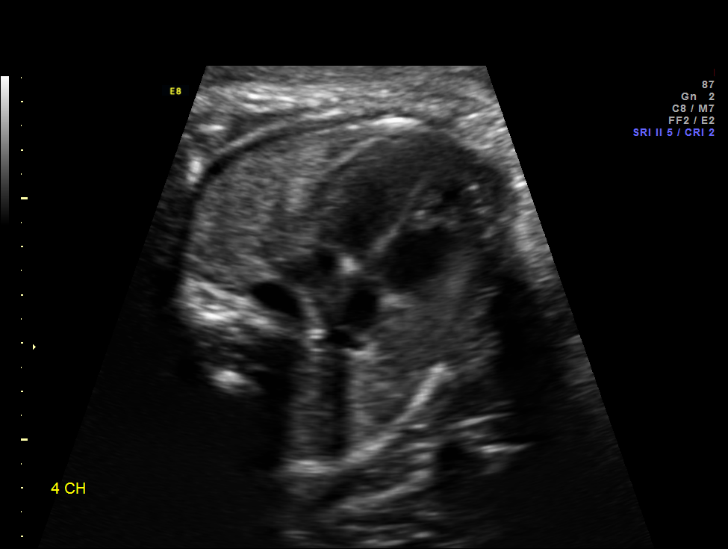
[im 15/20]
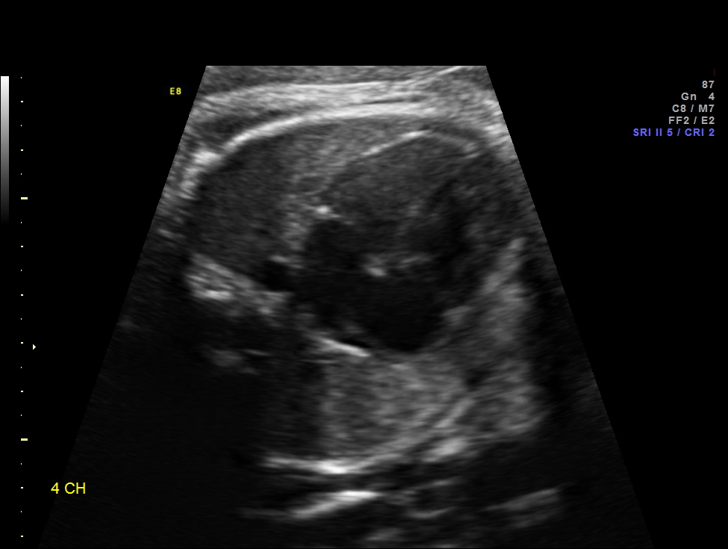
[im 17/20]
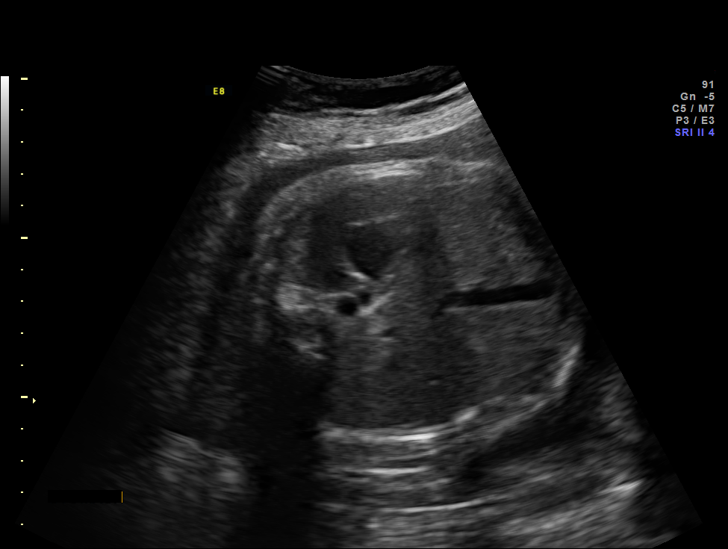
[im 18/20]
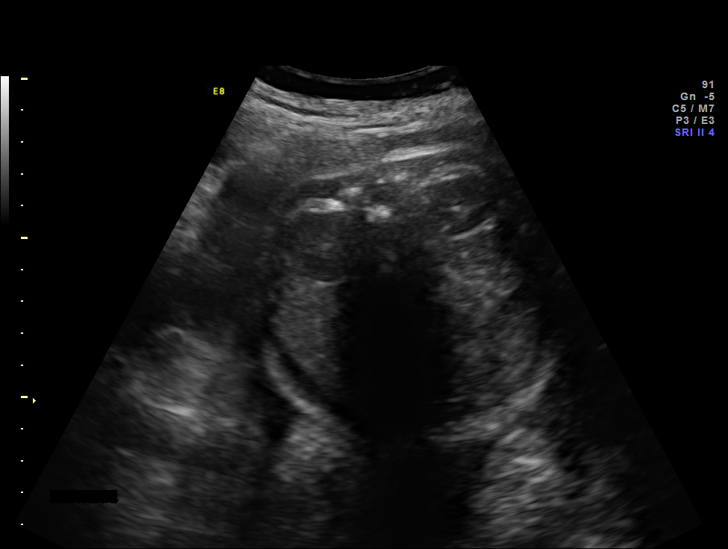
[im 20/20]
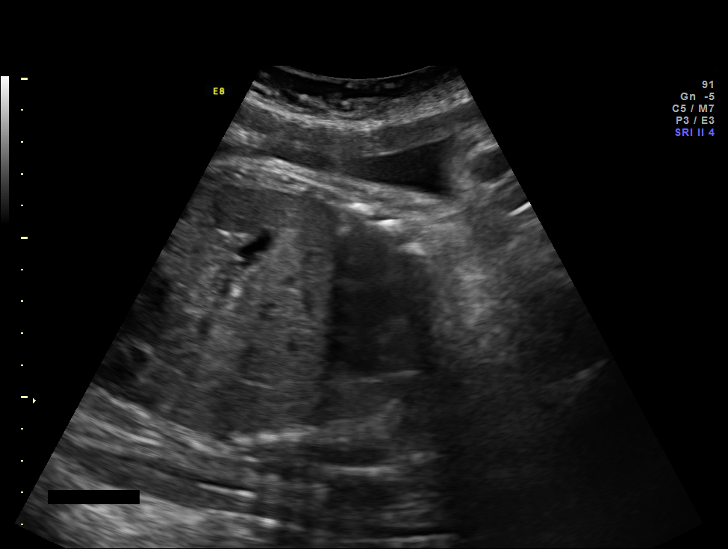

[13 of 20 positions shown; findings below may reference images not displayed]

OBSTETRICS REPORT
                      (Signed Final 01/10/2014 [DATE])

Service(s) Provided

 [HOSPITAL]                                         76815.0
Indications

 Hypertension - Chronic/Pre-existing on Labetalol
 Hyperthyroid on Methimazole
 Previous cesarean section
 Poor obstetric history: Previous preeclampsia /
 eclampsia/gestational HTN
 High risk pregnancy due to maternal drug use-         648.40, 305.90,
 lisinopril
Fetal Evaluation

 Num Of Fetuses:    1
 Fetal Heart Rate:  165                          bpm
 Cardiac Activity:  Observed
 Presentation:      Cephalic
 Placenta:          Anterior, above cervical os
 P. Cord            Previously Visualized
 Insertion:

 Amniotic Fluid
 AFI FV:      Subjectively within normal limits
 AFI Sum:     14.2    cm       53  %Tile     Larg Pckt:    7.09  cm
 RUQ:   7.09    cm   LUQ:    3.58   cm    LLQ:   3.53    cm
Gestational Age

 Best:          37w 2d     Det. By:  Early Ultrasound         EDD:   01/29/14
                                     (06/21/13)
Cervix Uterus Adnexa

 Cervix:       Not visualized (advanced GA >91wks)
Impression

 Single IUP at 37w 2d
 Limited ultrasound performed for amniotic fluid assessment
 Reactive NST
 Normal amniotic fluid volume (14 cm)
 Normal modified BPP
Recommendations

 Continue 2x weekly NSTs with weekly AFIs
 Recommend delivery no earlier that 38 weeks due to chronic
 hypertension

## 2016-08-06 ENCOUNTER — Emergency Department (HOSPITAL_COMMUNITY)
Admission: EM | Admit: 2016-08-06 | Discharge: 2016-08-06 | Disposition: A | Discharge status: Home / Self Care | Payer: Medicaid Other | Attending: Emergency Medicine | Admitting: Emergency Medicine

## 2016-08-06 ENCOUNTER — Encounter (HOSPITAL_COMMUNITY): Payer: Self-pay

## 2016-08-06 DIAGNOSIS — Z87891 Personal history of nicotine dependence: Secondary | ICD-10-CM | POA: Insufficient documentation

## 2016-08-06 DIAGNOSIS — I110 Hypertensive heart disease with heart failure: Secondary | ICD-10-CM | POA: Diagnosis not present

## 2016-08-06 DIAGNOSIS — I5042 Chronic combined systolic (congestive) and diastolic (congestive) heart failure: Secondary | ICD-10-CM | POA: Insufficient documentation

## 2016-08-06 DIAGNOSIS — N764 Abscess of vulva: Secondary | ICD-10-CM | POA: Diagnosis not present

## 2016-08-06 DIAGNOSIS — R599 Enlarged lymph nodes, unspecified: Secondary | ICD-10-CM | POA: Insufficient documentation

## 2016-08-06 DIAGNOSIS — L0291 Cutaneous abscess, unspecified: Secondary | ICD-10-CM

## 2016-08-06 MED ORDER — TRAMADOL HCL 50 MG PO TABS: 50.0000 mg | ORAL_TABLET | Freq: Four times a day (QID) | ORAL | 0 refills | Status: DC | PRN

## 2016-08-06 MED ORDER — NAPROXEN 500 MG PO TABS: 500.0000 mg | ORAL_TABLET | Freq: Two times a day (BID) | ORAL | 0 refills | Status: DC

## 2016-08-06 MED ORDER — DOXYCYCLINE HYCLATE 100 MG PO CAPS: 100.0000 mg | ORAL_CAPSULE | Freq: Two times a day (BID) | ORAL | 0 refills | Status: DC

## 2016-08-06 NOTE — ED Notes (Signed)
Ambulated pt from waiting room to TR08

## 2016-08-06 NOTE — ED Provider Notes (Signed)
[facility]-EMERGENCY DEPT Provider Note    By signing my name below, I, Marilyn Norman, attest that this documentation has been prepared under the direction and in the presence of [facility] [place]. Electronically Signed: Earmon Norman, ED Scribe. 08/06/16. 4:40 PM.   History   Chief Complaint Chief Complaint  Patient presents with  . Abscess    The history is provided by the patient and medical records. No language interpreter was used.    HPI Comments:  Marilyn Norman is a 25 y.o. female who presents to the Emergency Department complaining of an abscess that appeared today. She reports associated watery, brown diarrhea x 3 episodes and a swollen lymph node to left inguinal area. She has not taken anything for pain but has been soaking in a hot bath to treat her symptoms. Touching the areas increase the pain. She denies alleviating factors. She denies nausea, vomiting, abdominal pain, vaginal discharge or bleeding, fever, chills. She reports she has not had sex in the last 2.5 months. She reports h/o gonorrhea and trichomonas 2 years ago.   Past Medical History:  Diagnosis Date  . Cardiomegaly   . Chest pain   . Chlamydia   . Costochondritis   . Graves' eye disease   . Hypertension   . Hyperthyroidism   . Pre-eclampsia    with each pregnancy  . Thyroid disease    hyperthyroid  . Trichomonas infection     Patient Active Problem List   Diagnosis Date Noted  . Cardiomyopathy, peripartum, delivered 02/12/2014  . Acute combined systolic and diastolic heart failure (HCC) 02/12/2014  . Essential hypertension, malignant 02/12/2014  . Acute CHF (congestive heart failure) (HCC) 02/11/2014  . S/P cesarean section 01/22/2014  . Elevated BP 12/12/2013  . Hyperthyroidism 07/31/2013  . Cardiomegaly   . Costochondritis   . Essential hypertension   . Chest pain     Past Surgical History:  Procedure Laterality Date  . CESAREAN SECTION    . CESAREAN SECTION WITH BILATERAL TUBAL  LIGATION N/A 01/22/2014   Procedure: REPEAT CESAREAN SECTION WITH BILATERAL TUBAL LIGATION;  Surgeon: Kathreen Cosier, MD;  Location: [facility];  Service: Obstetrics;  Laterality: N/A;  . IUD REMOVAL      OB History    Gravida Para Term Preterm AB Living   6 3 3   3 3    SAB TAB Ectopic Multiple Live Births   3       3       Home Medications    Prior to Admission medications   Medication Sig Start Date End Date Taking? Authorizing Provider  doxycycline (VIBRAMYCIN) 100 MG capsule Take 1 capsule (100 mg total) by mouth 2 (two) times daily. 08/06/16   Tami Barren Orlene Och, NP  naproxen (NAPROSYN) 500 MG tablet Take 1 tablet (500 mg total) by mouth 2 (two) times daily. 08/06/16   Jazzmine Kleiman Orlene Och, NP  traMADol (ULTRAM) 50 MG tablet Take 1 tablet (50 mg total) by mouth every 6 (six) hours as needed. 08/06/16   Myron Stankovich Orlene Och, NP    Family History Family History  Problem Relation Age of Onset  . Asthma Mother   . Heart disease Father     Social History Social History  Substance Use Topics  . Smoking status: Former Smoker    Quit date: 10/19/2013  . Smokeless tobacco: Not on file  . Alcohol use No     Allergies   Strawberry extract   Review of Systems Review of Systems  Constitutional: Negative for chills and fever.  Gastrointestinal: Positive for diarrhea. Negative for abdominal pain, nausea and vomiting.  Genitourinary: Negative for vaginal bleeding and vaginal discharge.  Skin: Positive for color change.  All other systems reviewed and are negative.    Physical Exam Updated Vital Signs BP 193/100 (BP Location: Left Arm)   Pulse 68   Temp 98.3 F (36.8 C) (Oral)   Resp 18   LMP 07/13/2016 (Exact Date)   SpO2 99%   Physical Exam  Constitutional: She is oriented to person, place, and time. She appears well-developed and well-nourished.  Eyes: EOM are normal.  Neck: Neck supple.  Cardiovascular: Normal rate.   Pulmonary/Chest: Effort normal.  Abdominal: Soft. There is no  tenderness.  Genitourinary:  Genitourinary Comments: Tender 1 cm raised area to the left vaginal area just inside the introitus.  Musculoskeletal: Normal range of motion.  Lymphadenopathy:       Left: Inguinal adenopathy present.  BB sized enlarged lymph nodes in left inguinal area.  Neurological: She is alert and oriented to person, place, and time. No cranial nerve deficit.  Skin: Skin is warm and dry.  Psychiatric: She has a normal mood and affect. Her behavior is normal.  Nursing note and vitals reviewed.    ED Treatments / Results  DIAGNOSTIC STUDIES: Oxygen Saturation is 99% on RA, normal by my interpretation.   COORDINATION OF CARE: 4:37 PM- Offered to I & D abscess but informed pt that she could try antibiotics first to see if it cleared up. She declined I & D and will take prescription for Doxycycline, Tramadol and Naprosyn. Pt verbalizes understanding and agrees to plan.  Medications - No data to display  Labs (all labs ordered are listed, but only abnormal results are displayed) Labs Reviewed - No data to display  Radiology No results found.  Procedures Procedures (including critical care time)  Medications Ordered in ED Medications - No data to display  Initial Impression / Assessment and Plan / ED Course  I have reviewed the triage vital signs and the nursing notes.  Elevated BP and patient encouraged to f/u with her PCP  Clinical Course     Patient with skin abscess. Incision and drainage discussed but pt declined stating she wants to try antibiotics first. Supportive care and return precautions discussed.  Pt sent home with Doxycycline and Naprosyn. The patient appears reasonably screened and/or stabilized for discharge and I doubt any other emergent medical condition requiring further screening, evaluation, or treatment in the ED prior to discharge.  I personally performed the services described in this documentation, which was scribed in my presence. The  recorded information has been reviewed and is accurate.   Final Clinical Impressions(s) / ED Diagnoses   Final diagnoses:  Abscess  Lymph node enlargement    New Prescriptions New Prescriptions   DOXYCYCLINE (VIBRAMYCIN) 100 MG CAPSULE    Take 1 capsule (100 mg total) by mouth 2 (two) times daily.   NAPROXEN (NAPROSYN) 500 MG TABLET    Take 1 tablet (500 mg total) by mouth 2 (two) times daily.   TRAMADOL (ULTRAM) 50 MG TABLET    Take 1 tablet (50 mg total) by mouth every 6 (six) hours as needed.     CrescentHope M Brayen Bunn, NP 08/06/16 [id number]    Laurence Spatesachel Morgan Little, MD 08/07/16 [phone]

## 2016-08-06 NOTE — Discharge Instructions (Signed)
Do not drive while taking the narcotic pain medication as it will make you sleepy. Return for worsening symptoms.

## 2016-08-06 NOTE — ED Triage Notes (Signed)
Pt presents for evaluation of possible abscess to L upper groin and L vaginal area. Pt. Reports just noticed areas this AM. Area is free of redness, but tender to palpation. Pt denies urinary symptoms. Denies fever.

## 2016-08-20 ENCOUNTER — Emergency Department (HOSPITAL_COMMUNITY): Payer: Medicaid Other

## 2016-08-20 ENCOUNTER — Encounter (HOSPITAL_COMMUNITY): Payer: Self-pay | Admitting: *Deleted

## 2016-08-20 ENCOUNTER — Inpatient Hospital Stay (HOSPITAL_COMMUNITY)
Admission: EM | Admit: 2016-08-20 | Discharge: 2016-08-24 | DRG: 310 | Disposition: A | Discharge status: Home / Self Care | Payer: Medicaid Other | Attending: Family Medicine | Admitting: Family Medicine

## 2016-08-20 DIAGNOSIS — F1210 Cannabis abuse, uncomplicated: Secondary | ICD-10-CM | POA: Diagnosis present

## 2016-08-20 DIAGNOSIS — Z7901 Long term (current) use of anticoagulants: Secondary | ICD-10-CM

## 2016-08-20 DIAGNOSIS — I4891 Unspecified atrial fibrillation: Secondary | ICD-10-CM | POA: Diagnosis not present

## 2016-08-20 DIAGNOSIS — Z8249 Family history of ischemic heart disease and other diseases of the circulatory system: Secondary | ICD-10-CM

## 2016-08-20 DIAGNOSIS — S0990XA Unspecified injury of head, initial encounter: Secondary | ICD-10-CM | POA: Diagnosis present

## 2016-08-20 DIAGNOSIS — I160 Hypertensive urgency: Secondary | ICD-10-CM | POA: Diagnosis present

## 2016-08-20 DIAGNOSIS — Z91018 Allergy to other foods: Secondary | ICD-10-CM

## 2016-08-20 DIAGNOSIS — O903 Peripartum cardiomyopathy: Secondary | ICD-10-CM | POA: Diagnosis not present

## 2016-08-20 DIAGNOSIS — Y92410 Unspecified street and highway as the place of occurrence of the external cause: Secondary | ICD-10-CM

## 2016-08-20 DIAGNOSIS — E0500 Thyrotoxicosis with diffuse goiter without thyrotoxic crisis or storm: Secondary | ICD-10-CM | POA: Diagnosis present

## 2016-08-20 DIAGNOSIS — I480 Paroxysmal atrial fibrillation: Secondary | ICD-10-CM | POA: Diagnosis not present

## 2016-08-20 DIAGNOSIS — R7989 Other specified abnormal findings of blood chemistry: Secondary | ICD-10-CM

## 2016-08-20 DIAGNOSIS — I10 Essential (primary) hypertension: Secondary | ICD-10-CM | POA: Diagnosis not present

## 2016-08-20 DIAGNOSIS — Z825 Family history of asthma and other chronic lower respiratory diseases: Secondary | ICD-10-CM

## 2016-08-20 DIAGNOSIS — R945 Abnormal results of liver function studies: Secondary | ICD-10-CM | POA: Diagnosis present

## 2016-08-20 DIAGNOSIS — Z87891 Personal history of nicotine dependence: Secondary | ICD-10-CM

## 2016-08-20 DIAGNOSIS — E0590 Thyrotoxicosis, unspecified without thyrotoxic crisis or storm: Secondary | ICD-10-CM

## 2016-08-20 DIAGNOSIS — E876 Hypokalemia: Secondary | ICD-10-CM | POA: Diagnosis present

## 2016-08-20 DIAGNOSIS — I509 Heart failure, unspecified: Secondary | ICD-10-CM | POA: Diagnosis present

## 2016-08-20 DIAGNOSIS — I110 Hypertensive heart disease with heart failure: Secondary | ICD-10-CM | POA: Diagnosis present

## 2016-08-20 DIAGNOSIS — S199XXA Unspecified injury of neck, initial encounter: Secondary | ICD-10-CM | POA: Diagnosis present

## 2016-08-20 DIAGNOSIS — R222 Localized swelling, mass and lump, trunk: Secondary | ICD-10-CM | POA: Diagnosis present

## 2016-08-20 DIAGNOSIS — Z79899 Other long term (current) drug therapy: Secondary | ICD-10-CM

## 2016-08-20 LAB — CBC
HCT: 39.6 % (ref 36.0–46.0)
Hemoglobin: 14 g/dL (ref 12.0–15.0)
MCH: 27.6 pg (ref 26.0–34.0)
MCHC: 35.4 g/dL (ref 30.0–36.0)
MCV: 78 fL (ref 78.0–100.0)
PLATELETS: 200 10*3/uL (ref 150–400)
RBC: 5.08 MIL/uL (ref 3.87–5.11)
RDW: 13.7 % (ref 11.5–15.5)
WBC: 6.4 10*3/uL (ref 4.0–10.5)

## 2016-08-20 LAB — URINALYSIS, ROUTINE W REFLEX MICROSCOPIC
BILIRUBIN URINE: NEGATIVE
GLUCOSE, UA: NEGATIVE mg/dL
HGB URINE DIPSTICK: NEGATIVE
KETONES UR: NEGATIVE mg/dL
Leukocytes, UA: NEGATIVE
Nitrite: NEGATIVE
PROTEIN: NEGATIVE mg/dL
Specific Gravity, Urine: 1.042 — ABNORMAL HIGH (ref 1.005–1.030)
pH: 6 (ref 5.0–8.0)

## 2016-08-20 LAB — I-STAT CHEM 8, ED
BUN: 8 mg/dL (ref 6–20)
CHLORIDE: 110 mmol/L (ref 101–111)
Calcium, Ion: 1.19 mmol/L (ref 1.15–1.40)
Creatinine, Ser: 0.3 mg/dL — ABNORMAL LOW (ref 0.44–1.00)
GLUCOSE: 114 mg/dL — AB (ref 65–99)
HCT: 41 % (ref 36.0–46.0)
Hemoglobin: 13.9 g/dL (ref 12.0–15.0)
POTASSIUM: 3.6 mmol/L (ref 3.5–5.1)
Sodium: 143 mmol/L (ref 135–145)
TCO2: 21 mmol/L (ref 0–100)

## 2016-08-20 LAB — COMPREHENSIVE METABOLIC PANEL
ALBUMIN: 3.4 g/dL — AB (ref 3.5–5.0)
ALK PHOS: 418 U/L — AB (ref 38–126)
ALT: 41 U/L (ref 14–54)
AST: 43 U/L — ABNORMAL HIGH (ref 15–41)
Anion gap: 7 (ref 5–15)
BILIRUBIN TOTAL: 1.6 mg/dL — AB (ref 0.3–1.2)
BUN: 7 mg/dL (ref 6–20)
CALCIUM: 9.4 mg/dL (ref 8.9–10.3)
CO2: 22 mmol/L (ref 22–32)
CREATININE: 0.4 mg/dL — AB (ref 0.44–1.00)
Chloride: 112 mmol/L — ABNORMAL HIGH (ref 101–111)
GFR calc Af Amer: >60 mL/min (ref >60)
GFR calc non Af Amer: >60 mL/min (ref >60)
GLUCOSE: 120 mg/dL — AB (ref 65–99)
Potassium: 3.7 mmol/L (ref 3.5–5.1)
SODIUM: 141 mmol/L (ref 135–145)
TOTAL PROTEIN: 7 g/dL (ref 6.5–8.1)

## 2016-08-20 LAB — I-STAT CG4 LACTIC ACID, ED: Lactic Acid, Venous: 1.68 mmol/L (ref 0.5–1.9)

## 2016-08-20 LAB — T4, FREE: FREE T4: 5.26 ng/dL — AB (ref 0.61–1.12)

## 2016-08-20 LAB — I-STAT BETA HCG BLOOD, ED (MC, WL, AP ONLY): I-stat hCG, quantitative: <5 m[IU]/mL (ref <5)

## 2016-08-20 LAB — RAPID URINE DRUG SCREEN, HOSP PERFORMED
Amphetamines: NOT DETECTED
BENZODIAZEPINES: NOT DETECTED
Barbiturates: NOT DETECTED
COCAINE: NOT DETECTED
Opiates: NOT DETECTED
Tetrahydrocannabinol: POSITIVE — AB

## 2016-08-20 LAB — TSH

## 2016-08-20 LAB — ETHANOL

## 2016-08-20 LAB — PROTIME-INR
INR: 1.08
PROTHROMBIN TIME: 14 s (ref 11.4–15.2)

## 2016-08-20 LAB — CDS SEROLOGY

## 2016-08-20 MED ORDER — ONDANSETRON HCL 4 MG/2ML IJ SOLN: 4.0000 mg | Freq: Four times a day (QID) | INTRAMUSCULAR | Status: DC | PRN

## 2016-08-20 MED ORDER — SODIUM CHLORIDE 0.9 % IV BOLUS (SEPSIS)
1000.0000 mL | Freq: Once | INTRAVENOUS | Status: AC
  Administered 2016-08-20: 1000 mL via INTRAVENOUS

## 2016-08-20 MED ORDER — METOPROLOL TARTRATE 5 MG/5ML IV SOLN
INTRAVENOUS | Status: AC
  Filled 2016-08-20: qty 15

## 2016-08-20 MED ORDER — ADENOSINE 6 MG/2ML IV SOLN
6.0000 mg | Freq: Once | INTRAVENOUS | Status: AC
  Administered 2016-08-20: 6 mg via INTRAVENOUS
  Filled 2016-08-20: qty 2

## 2016-08-20 MED ORDER — LISINOPRIL 10 MG PO TABS
10.0000 mg | ORAL_TABLET | Freq: Two times a day (BID) | ORAL | Status: DC
  Administered 2016-08-20 – 2016-08-22 (×5): 10 mg via ORAL
  Filled 2016-08-20 (×5): qty 1

## 2016-08-20 MED ORDER — METOPROLOL TARTRATE 5 MG/5ML IV SOLN
5.0000 mg | INTRAVENOUS | Status: AC
  Filled 2016-08-20: qty 5

## 2016-08-20 MED ORDER — LORAZEPAM 2 MG/ML IJ SOLN
0.5000 mg | Freq: Once | INTRAMUSCULAR | Status: AC
  Administered 2016-08-20: 0.5 mg via INTRAVENOUS
  Filled 2016-08-20: qty 1

## 2016-08-20 MED ORDER — METOPROLOL TARTRATE 25 MG PO TABS
25.0000 mg | ORAL_TABLET | Freq: Four times a day (QID) | ORAL | Status: DC
  Administered 2016-08-20 – 2016-08-21 (×2): 25 mg via ORAL
  Filled 2016-08-20 (×3): qty 1

## 2016-08-20 MED ORDER — IOPAMIDOL (ISOVUE-300) INJECTION 61%
INTRAVENOUS | Status: AC
  Administered 2016-08-20: 100 mL via INTRAVENOUS
  Filled 2016-08-20: qty 100

## 2016-08-20 MED ORDER — METOPROLOL TARTRATE 5 MG/5ML IV SOLN
5.0000 mg | INTRAVENOUS | Status: AC | PRN
  Administered 2016-08-20 (×3): 5 mg via INTRAVENOUS

## 2016-08-20 MED ORDER — ACETAMINOPHEN 325 MG PO TABS
650.0000 mg | ORAL_TABLET | ORAL | Status: DC | PRN
  Administered 2016-08-21 – 2016-08-23 (×2): 650 mg via ORAL
  Filled 2016-08-20 (×2): qty 2

## 2016-08-20 MED ORDER — NICOTINE 14 MG/24HR TD PT24
14.0000 mg | MEDICATED_PATCH | Freq: Every day | TRANSDERMAL | Status: DC
  Administered 2016-08-21 – 2016-08-24 (×4): 14 mg via TRANSDERMAL
  Filled 2016-08-20 (×4): qty 1

## 2016-08-20 MED ORDER — ONDANSETRON HCL 4 MG/2ML IJ SOLN
4.0000 mg | Freq: Once | INTRAMUSCULAR | Status: AC
  Administered 2016-08-20: 4 mg via INTRAVENOUS
  Filled 2016-08-20: qty 2

## 2016-08-20 MED ORDER — METOPROLOL TARTRATE 5 MG/5ML IV SOLN
5.0000 mg | INTRAVENOUS | Status: AC
  Administered 2016-08-20 (×3): 5 mg via INTRAVENOUS
  Filled 2016-08-20: qty 5

## 2016-08-20 NOTE — ED Notes (Signed)
Report attempted 
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
## 2016-08-20 NOTE — ED Notes (Signed)
C collar removed by Rubin Payor MD

## 2016-08-20 NOTE — Plan of Care (Signed)
Problem: Physical Regulation: Goal: Ability to maintain clinical measurements within normal limits will improve Outcome: Progressing Pt maintains in NSR with a HR in the 90's. Pt denies any pain.

## 2016-08-20 NOTE — ED Notes (Signed)
Portable xray at bedside.
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
## 2016-08-21 ENCOUNTER — Observation Stay (HOSPITAL_COMMUNITY): Payer: Medicaid Other

## 2016-08-21 DIAGNOSIS — O903 Peripartum cardiomyopathy: Secondary | ICD-10-CM

## 2016-08-21 DIAGNOSIS — R945 Abnormal results of liver function studies: Secondary | ICD-10-CM | POA: Diagnosis present

## 2016-08-21 DIAGNOSIS — Z91018 Allergy to other foods: Secondary | ICD-10-CM | POA: Diagnosis not present

## 2016-08-21 DIAGNOSIS — Z7901 Long term (current) use of anticoagulants: Secondary | ICD-10-CM | POA: Diagnosis not present

## 2016-08-21 DIAGNOSIS — Y92410 Unspecified street and highway as the place of occurrence of the external cause: Secondary | ICD-10-CM | POA: Diagnosis not present

## 2016-08-21 DIAGNOSIS — I160 Hypertensive urgency: Secondary | ICD-10-CM | POA: Diagnosis not present

## 2016-08-21 DIAGNOSIS — I110 Hypertensive heart disease with heart failure: Secondary | ICD-10-CM | POA: Diagnosis present

## 2016-08-21 DIAGNOSIS — I4891 Unspecified atrial fibrillation: Secondary | ICD-10-CM

## 2016-08-21 DIAGNOSIS — Z8249 Family history of ischemic heart disease and other diseases of the circulatory system: Secondary | ICD-10-CM | POA: Diagnosis not present

## 2016-08-21 DIAGNOSIS — I480 Paroxysmal atrial fibrillation: Secondary | ICD-10-CM | POA: Diagnosis not present

## 2016-08-21 DIAGNOSIS — S199XXA Unspecified injury of neck, initial encounter: Secondary | ICD-10-CM | POA: Diagnosis present

## 2016-08-21 DIAGNOSIS — R222 Localized swelling, mass and lump, trunk: Secondary | ICD-10-CM | POA: Diagnosis present

## 2016-08-21 DIAGNOSIS — Z825 Family history of asthma and other chronic lower respiratory diseases: Secondary | ICD-10-CM | POA: Diagnosis not present

## 2016-08-21 DIAGNOSIS — E0590 Thyrotoxicosis, unspecified without thyrotoxic crisis or storm: Secondary | ICD-10-CM | POA: Diagnosis not present

## 2016-08-21 DIAGNOSIS — E876 Hypokalemia: Secondary | ICD-10-CM | POA: Diagnosis present

## 2016-08-21 DIAGNOSIS — E0500 Thyrotoxicosis with diffuse goiter without thyrotoxic crisis or storm: Secondary | ICD-10-CM | POA: Diagnosis present

## 2016-08-21 DIAGNOSIS — F1210 Cannabis abuse, uncomplicated: Secondary | ICD-10-CM | POA: Diagnosis present

## 2016-08-21 DIAGNOSIS — S0990XA Unspecified injury of head, initial encounter: Secondary | ICD-10-CM | POA: Diagnosis present

## 2016-08-21 DIAGNOSIS — I10 Essential (primary) hypertension: Secondary | ICD-10-CM | POA: Diagnosis not present

## 2016-08-21 DIAGNOSIS — I509 Heart failure, unspecified: Secondary | ICD-10-CM | POA: Diagnosis present

## 2016-08-21 DIAGNOSIS — Z79899 Other long term (current) drug therapy: Secondary | ICD-10-CM | POA: Diagnosis not present

## 2016-08-21 DIAGNOSIS — Z87891 Personal history of nicotine dependence: Secondary | ICD-10-CM | POA: Diagnosis not present

## 2016-08-21 LAB — ECHOCARDIOGRAM COMPLETE
E decel time: 127 msec
EERAT: 18.63
FS: 20 % — AB (ref 28–44)
Height: 64 in
IVS/LV PW RATIO, ED: 0.94
LA vol A4C: 70.9 ml
LA vol index: 41.1 mL/m2
LA vol: 77.3 mL
LDCA: 4.52 cm2
LV E/e'average: 18.63
LV PW d: 12.8 mm — AB (ref 0.6–1.1)
LV e' LATERAL: 8.16 cm/s
LVEEMED: 18.63
LVOTD: 24 mm
MV Dec: 127
MV Peak grad: 9 mmHg
MV pk A vel: 120 m/s
MVPKEVEL: 152 m/s
RV LATERAL S' VELOCITY: 16.2 cm/s
RV TAPSE: 22.4 mm
Reg peak vel: 312 cm/s
TDI e' lateral: 8.16
TDI e' medial: 9.25
TR max vel: 312 cm/s
Weight: 2929.6 oz

## 2016-08-21 LAB — COMPREHENSIVE METABOLIC PANEL
ALK PHOS: 328 U/L — AB (ref 38–126)
ALT: 36 U/L (ref 14–54)
AST: 36 U/L (ref 15–41)
Albumin: 2.8 g/dL — ABNORMAL LOW (ref 3.5–5.0)
Anion gap: 9 (ref 5–15)
BUN: 6 mg/dL (ref 6–20)
CALCIUM: 9.1 mg/dL (ref 8.9–10.3)
CO2: 19 mmol/L — AB (ref 22–32)
CREATININE: 0.38 mg/dL — AB (ref 0.44–1.00)
Chloride: 110 mmol/L (ref 101–111)
Glucose, Bld: 103 mg/dL — ABNORMAL HIGH (ref 65–99)
Potassium: 3.4 mmol/L — ABNORMAL LOW (ref 3.5–5.1)
Sodium: 138 mmol/L (ref 135–145)
Total Bilirubin: 1.4 mg/dL — ABNORMAL HIGH (ref 0.3–1.2)
Total Protein: 5.9 g/dL — ABNORMAL LOW (ref 6.5–8.1)

## 2016-08-21 LAB — SAMPLE TO BLOOD BANK

## 2016-08-21 LAB — PREGNANCY, URINE: PREG TEST UR: NEGATIVE

## 2016-08-21 MED ORDER — METOPROLOL SUCCINATE ER 100 MG PO TB24
200.0000 mg | ORAL_TABLET | Freq: Every day | ORAL | Status: DC
  Administered 2016-08-21 – 2016-08-24 (×4): 200 mg via ORAL
  Filled 2016-08-21 (×4): qty 2

## 2016-08-21 MED ORDER — POTASSIUM CHLORIDE CRYS ER 20 MEQ PO TBCR
40.0000 meq | EXTENDED_RELEASE_TABLET | Freq: Once | ORAL | Status: AC
  Administered 2016-08-21: 40 meq via ORAL
  Filled 2016-08-21: qty 2

## 2016-08-21 MED ORDER — HYDRALAZINE HCL 20 MG/ML IJ SOLN: 20.0000 mg | Freq: Four times a day (QID) | INTRAMUSCULAR | Status: DC | PRN

## 2016-08-21 MED ORDER — METOPROLOL TARTRATE 5 MG/5ML IV SOLN
5.0000 mg | Freq: Once | INTRAVENOUS | Status: AC
  Administered 2016-08-21: 5 mg via INTRAVENOUS
  Filled 2016-08-21: qty 5

## 2016-08-21 MED ORDER — RIVAROXABAN 20 MG PO TABS
20.0000 mg | ORAL_TABLET | Freq: Every day | ORAL | Status: DC
  Administered 2016-08-21 – 2016-08-23 (×3): 20 mg via ORAL
  Filled 2016-08-21 (×3): qty 1

## 2016-08-21 MED ORDER — AMLODIPINE BESYLATE 5 MG PO TABS
5.0000 mg | ORAL_TABLET | Freq: Every day | ORAL | Status: DC
  Administered 2016-08-21 – 2016-08-22 (×2): 5 mg via ORAL
  Filled 2016-08-21 (×2): qty 1

## 2016-08-21 MED ORDER — LABETALOL HCL 5 MG/ML IV SOLN
20.0000 mg | INTRAVENOUS | Status: DC | PRN
  Administered 2016-08-21 – 2016-08-22 (×3): 20 mg via INTRAVENOUS
  Filled 2016-08-21 (×3): qty 4

## 2016-08-21 MED ORDER — METHIMAZOLE 10 MG PO TABS
10.0000 mg | ORAL_TABLET | Freq: Three times a day (TID) | ORAL | Status: DC
  Administered 2016-08-21 – 2016-08-23 (×8): 10 mg via ORAL
  Filled 2016-08-21 (×9): qty 1
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
## 2016-08-22 DIAGNOSIS — I10 Essential (primary) hypertension: Secondary | ICD-10-CM

## 2016-08-22 LAB — COMPREHENSIVE METABOLIC PANEL
ALBUMIN: 3.3 g/dL — AB (ref 3.5–5.0)
ALK PHOS: 368 U/L — AB (ref 38–126)
ALT: 36 U/L (ref 14–54)
AST: 31 U/L (ref 15–41)
Anion gap: 10 (ref 5–15)
BILIRUBIN TOTAL: 2.7 mg/dL — AB (ref 0.3–1.2)
BUN: 5 mg/dL — AB (ref 6–20)
CALCIUM: 9.6 mg/dL (ref 8.9–10.3)
CO2: 22 mmol/L (ref 22–32)
CREATININE: 0.4 mg/dL — AB (ref 0.44–1.00)
Chloride: 107 mmol/L (ref 101–111)
GFR calc Af Amer: >60 mL/min (ref >60)
GLUCOSE: 90 mg/dL (ref 65–99)
Potassium: 4.3 mmol/L (ref 3.5–5.1)
Sodium: 139 mmol/L (ref 135–145)
TOTAL PROTEIN: 7.1 g/dL (ref 6.5–8.1)

## 2016-08-22 MED ORDER — AMLODIPINE BESYLATE 5 MG PO TABS
5.0000 mg | ORAL_TABLET | ORAL | Status: AC
  Administered 2016-08-22: 5 mg via ORAL
  Filled 2016-08-22: qty 1

## 2016-08-22 MED ORDER — HYDRALAZINE HCL 20 MG/ML IJ SOLN
10.0000 mg | Freq: Once | INTRAMUSCULAR | Status: AC
  Administered 2016-08-22: 10 mg via INTRAVENOUS
  Filled 2016-08-22: qty 1

## 2016-08-22 MED ORDER — HYDROCHLOROTHIAZIDE 25 MG PO TABS
25.0000 mg | ORAL_TABLET | Freq: Every day | ORAL | Status: DC
  Administered 2016-08-22 – 2016-08-24 (×3): 25 mg via ORAL
  Filled 2016-08-22 (×3): qty 1

## 2016-08-22 MED ORDER — AMLODIPINE BESYLATE 10 MG PO TABS
10.0000 mg | ORAL_TABLET | Freq: Every day | ORAL | Status: DC
  Administered 2016-08-23 – 2016-08-24 (×2): 10 mg via ORAL
  Filled 2016-08-22 (×2): qty 1

## 2016-08-22 MED ORDER — WHITE PETROLATUM GEL
Status: AC
  Filled 2016-08-22: qty 1

## 2016-08-22 NOTE — Discharge Instructions (Addendum)
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
## 2016-08-23 MED ORDER — METHIMAZOLE 10 MG PO TABS
60.0000 mg | ORAL_TABLET | Freq: Every day | ORAL | Status: DC
  Administered 2016-08-24: 60 mg via ORAL
  Filled 2016-08-23: qty 6

## 2016-08-23 MED ORDER — LISINOPRIL 20 MG PO TABS
20.0000 mg | ORAL_TABLET | Freq: Two times a day (BID) | ORAL | Status: DC
  Administered 2016-08-23 – 2016-08-24 (×3): 20 mg via ORAL
  Filled 2016-08-23 (×3): qty 1

## 2016-08-23 MED ORDER — METHIMAZOLE 10 MG PO TABS
50.0000 mg | ORAL_TABLET | Freq: Once | ORAL | Status: AC
  Administered 2016-08-23: 50 mg via ORAL
  Filled 2016-08-23: qty 5
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
## 2016-08-24 LAB — BASIC METABOLIC PANEL
Anion gap: 10 (ref 5–15)
BUN: 14 mg/dL (ref 6–20)
CO2: 20 mmol/L — ABNORMAL LOW (ref 22–32)
CREATININE: 0.45 mg/dL (ref 0.44–1.00)
Calcium: 10 mg/dL (ref 8.9–10.3)
Chloride: 106 mmol/L (ref 101–111)
GFR calc Af Amer: >60 mL/min (ref >60)
GFR calc non Af Amer: >60 mL/min (ref >60)
GLUCOSE: 86 mg/dL (ref 65–99)
Potassium: 4.1 mmol/L (ref 3.5–5.1)
SODIUM: 136 mmol/L (ref 135–145)

## 2016-08-24 MED ORDER — METOPROLOL SUCCINATE ER 200 MG PO TB24: 200.0000 mg | ORAL_TABLET | Freq: Every day | ORAL | 0 refills | Status: DC

## 2016-08-24 MED ORDER — METHIMAZOLE 10 MG PO TABS: 60.0000 mg | ORAL_TABLET | Freq: Every day | ORAL | 0 refills | Status: DC

## 2016-08-24 MED ORDER — HYDROCHLOROTHIAZIDE 25 MG PO TABS: 25.0000 mg | ORAL_TABLET | Freq: Every day | ORAL | 0 refills | Status: DC

## 2016-08-24 MED ORDER — RIVAROXABAN 20 MG PO TABS: 20.0000 mg | ORAL_TABLET | Freq: Every day | ORAL | 0 refills | Status: DC

## 2016-08-24 MED ORDER — RIVAROXABAN (XARELTO) VTE STARTER PACK (15 & 20 MG): ORAL_TABLET | ORAL | 0 refills | Status: DC

## 2016-08-24 MED ORDER — LISINOPRIL 20 MG PO TABS: 20.0000 mg | ORAL_TABLET | Freq: Two times a day (BID) | ORAL | 0 refills | Status: DC

## 2016-08-24 MED ORDER — NICOTINE 14 MG/24HR TD PT24: 14.0000 mg | MEDICATED_PATCH | Freq: Every day | TRANSDERMAL | 0 refills | Status: DC

## 2016-08-24 MED ORDER — AMLODIPINE BESYLATE 10 MG PO TABS: 10.0000 mg | ORAL_TABLET | Freq: Every day | ORAL | 0 refills | Status: DC

## 2016-08-24 NOTE — Care Management Note (Signed)
Case Management Note  Patient Details  Name: Marilyn Norman MRN: [id number] Date of Birth: 06/11/1991  Subjective/Objective:   Pt presented for Atrial Fib. Plan for d/c home on Xarelto. Pt has insurance Medicaid and cost should be no more than 3.00. If medication needs prior authorization PCP will need to call in for Prior Authorization. Pt was provided the 30 day free card. CM did call the [facility] on [address] and medication is not available.                Action/Plan: CM did call [facility] and medication is available at [facility] Pt states she will be able to get to location. No further needs from CM at this time.   Expected Discharge Date:                  Expected Discharge Plan:  Home/Self Care  In-House Referral:  NA  Discharge planning Services  Medication Assistance  Post Acute Care Choice:  NA Choice offered to:  NA  DME Arranged:  N/A DME Agency:  NA  HH Arranged:  NA HH Agency:  NA  Status of Service:  Completed, signed off  If discussed at Long Length of Stay Meetings, dates discussed:    Additional Comments:  Gala Lewandowsky, RN 08/24/2016, 10:52 AM
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
## 2016-09-03 ENCOUNTER — Encounter: Payer: Self-pay | Admitting: Cardiology

## 2016-09-03 ENCOUNTER — Ambulatory Visit (INDEPENDENT_AMBULATORY_CARE_PROVIDER_SITE_OTHER): Payer: Medicaid Other | Admitting: Cardiology

## 2016-09-03 VITALS — BP 150/80 | HR 76 | Ht 64.0 in | Wt 179.0 lb

## 2016-09-03 DIAGNOSIS — I481 Persistent atrial fibrillation: Secondary | ICD-10-CM | POA: Diagnosis not present

## 2016-09-03 DIAGNOSIS — I10 Essential (primary) hypertension: Secondary | ICD-10-CM

## 2016-09-03 DIAGNOSIS — O903 Peripartum cardiomyopathy: Secondary | ICD-10-CM

## 2016-09-03 DIAGNOSIS — E0501 Thyrotoxicosis with diffuse goiter with thyrotoxic crisis or storm: Secondary | ICD-10-CM | POA: Diagnosis not present

## 2016-09-03 DIAGNOSIS — I4819 Other persistent atrial fibrillation: Secondary | ICD-10-CM

## 2016-09-03 NOTE — Patient Instructions (Signed)
Medication Instructions:  Your physician recommends that you continue on your current medications as directed. Please refer to the Current Medication list given to you today.   Labwork: TODAY:  CBC & CMET  Testing/Procedures: None ordered  Follow-Up: Your physician recommends that you schedule a follow-up appointment in: 1 MONTHS WITH Nada Boozer, NP AND 3 MONTHS WITH DR. Eden Emms   Any Other Special Instructions Will Be Listed Below (If Applicable).     If you need a refill on your cardiac medications before your next appointment, please call your pharmacy.

## 2016-09-04 LAB — CBC
Hematocrit: 39.6 % (ref 34.0–46.6)
Hemoglobin: 13.5 g/dL (ref 11.1–15.9)
MCH: 27.1 pg (ref 26.6–33.0)
MCHC: 34.1 g/dL (ref 31.5–35.7)
MCV: 80 fL (ref 79–97)
PLATELETS: 288 10*3/uL (ref 150–379)
RBC: 4.98 x10E6/uL (ref 3.77–5.28)
RDW: 14.7 % (ref 12.3–15.4)
WBC: 7.4 10*3/uL (ref 3.4–10.8)

## 2016-09-04 LAB — COMPREHENSIVE METABOLIC PANEL
A/G RATIO: 1.2 (ref 1.2–2.2)
ALT: 49 IU/L — AB (ref 0–32)
AST: 40 IU/L (ref 0–40)
Albumin: 3.9 g/dL (ref 3.5–5.5)
Alkaline Phosphatase: 419 IU/L — ABNORMAL HIGH (ref 39–117)
BILIRUBIN TOTAL: 1 mg/dL (ref 0.0–1.2)
BUN/Creatinine Ratio: 40 — ABNORMAL HIGH (ref 9–23)
BUN: 16 mg/dL (ref 6–20)
CHLORIDE: 101 mmol/L (ref 96–106)
CO2: 22 mmol/L (ref 18–29)
Calcium: 9.9 mg/dL (ref 8.7–10.2)
Creatinine, Ser: 0.4 mg/dL — ABNORMAL LOW (ref 0.57–1.00)
GFR calc non Af Amer: 144 mL/min/{1.73_m2} (ref >59)
GFR, EST AFRICAN AMERICAN: 166 mL/min/{1.73_m2} (ref >59)
Globulin, Total: 3.3 g/dL (ref 1.5–4.5)
Glucose: 93 mg/dL (ref 65–99)
Potassium: 4.1 mmol/L (ref 3.5–5.2)
Sodium: 141 mmol/L (ref 134–144)
Total Protein: 7.2 g/dL (ref 6.0–8.5)

## 2016-09-07 ENCOUNTER — Ambulatory Visit: Payer: Medicaid Other | Admitting: Cardiology

## 2016-09-14 ENCOUNTER — Encounter: Payer: Self-pay | Admitting: *Deleted

## 2016-10-04 ENCOUNTER — Encounter: Payer: Self-pay | Admitting: Cardiology
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
## 2016-10-11 ENCOUNTER — Ambulatory Visit: Payer: Medicaid Other | Admitting: Cardiology

## 2016-10-13 ENCOUNTER — Encounter: Payer: Self-pay | Admitting: Cardiology

## 2017-02-21 ENCOUNTER — Encounter (HOSPITAL_COMMUNITY): Payer: Self-pay

## 2017-02-21 ENCOUNTER — Emergency Department (HOSPITAL_COMMUNITY)
Admission: EM | Admit: 2017-02-21 | Discharge: 2017-02-21 | Disposition: A | Discharge status: Home / Self Care | Payer: Medicaid Other | Attending: Emergency Medicine | Admitting: Emergency Medicine

## 2017-02-21 DIAGNOSIS — R600 Localized edema: Secondary | ICD-10-CM | POA: Diagnosis not present

## 2017-02-21 DIAGNOSIS — Z5321 Procedure and treatment not carried out due to patient leaving prior to being seen by health care provider: Secondary | ICD-10-CM | POA: Diagnosis not present

## 2017-02-21 HISTORY — DX: Unspecified atrial fibrillation: I48.91

## 2017-02-21 NOTE — ED Triage Notes (Signed)
PT reports facial swelling x 2 days. She states she feels like her jaws are "locking". Swelling noted to jaw line and bilateral eyes. Airway intact

## 2017-06-06 ENCOUNTER — Telehealth: Payer: Self-pay

## 2017-06-06 NOTE — Telephone Encounter (Signed)
Will forward to Nada Boozer NP, since she saw patient last.

## 2017-06-06 NOTE — Telephone Encounter (Signed)
She could do coumadin, and have INRs through our office.  She could ask her insurance if any of the other of similar meds have better coverage.  Eliquis or Pradaxa is only other choice.  The coumadin would be cheaper.

## 2017-06-06 NOTE — Telephone Encounter (Signed)
Left message for patient to call back  

## 2017-06-06 NOTE — Telephone Encounter (Signed)
Patient does not have any insurance. Will forward to pre auth.

## 2017-06-07 ENCOUNTER — Other Ambulatory Visit: Payer: Self-pay

## 2017-06-07 MED ORDER — RIVAROXABAN 20 MG PO TABS: 20.0000 mg | ORAL_TABLET | Freq: Every day | ORAL | 3 refills | Status: DC

## 2017-06-07 NOTE — Telephone Encounter (Signed)
LMTCB

## 2017-06-07 NOTE — Telephone Encounter (Signed)
**Note De-Identified Marilyn Norman Obfuscation** LMTCB.  Since the pt does not have insurance she is eligible for pt assistance with Laural Benes and [employer] for [employer]. I have filled out the provider part of the application and printed a RX for Xarelto that I will have Dr Eden Emms sign once/if the pt states that she is interested in applying. I have placed the pts application in the yellow folder in the PA Dept.

## 2017-06-07 NOTE — Telephone Encounter (Signed)
Follow up      Patient returning call , Please call.

## 2017-06-08 ENCOUNTER — Telehealth: Payer: Self-pay | Admitting: Cardiovascular Disease

## 2017-06-08 NOTE — Telephone Encounter (Signed)
Attempted to call 2 separate times. Calls could not be connected.

## 2017-06-08 NOTE — Telephone Encounter (Signed)
Pt returned call regarding test results--[id number]

## 2017-06-08 NOTE — Telephone Encounter (Signed)
Larita Fife, LPN contacted pt yesterday about pt assistance for Xarelto.  Will send message to PA dept.

## 2017-06-09 NOTE — Telephone Encounter (Addendum)
Patient called into office, I discussed with her Marilyn Norman and Marilyn Norman patient assistance program to help her with her XARELTO. She states she has no insurance and is interested in completing paperwork. She states she has been OUT OF XARELTO for days, I informed her she needed to come into office today and get samples. Will place paper work and samples of xarelto at front desk for her, she states she is on her way to office.   Will route to Dr Eden Emms and Antonietta Breach, RN  Larita Fife Via, LPN has already placed provider information and script in Dr Ricki Miller box for signature.

## 2017-06-14 NOTE — Telephone Encounter (Signed)
**Note De-Identified Marilyn Norman Obfuscation** See phone note from 06/08/17.

## 2017-06-20 NOTE — Telephone Encounter (Signed)
The pt returned her application without her proof of income or her out of pocket expense report from her pharmacy. I have left a message asking her to call us back.

## 2017-06-21 NOTE — Telephone Encounter (Signed)
**Note De-Identified Marilyn Norman Obfuscation** The pt states that she did not work at all last year and that she has not paid for any of her medication out of pocket. I explained to her that in order to be approved for pt assistance its ok that she did not earn any money last year and that we can fax her application in without her out of pocket expense report and they may except it. I advised her that Jonny Ruiz and Laural Benes will contact her if they need more info.  Also, just incase the pt is not approved for pt assistance, I gave her the phone number to call the [employer] to apply over the phone with them.  Ive asked her to call me back and let me know if they can help her.

## 2017-06-27 NOTE — Telephone Encounter (Signed)
**Note De-Identified Marilyn Norman Obfuscation** The pt states that she called the [employer] and was advised that they are not accepting any A-Fib medications at this time.  I have advised her that I have not heard back from [place] and [place] at assistance yet and that I will leave her samples of Xarelto in our front office that she can pick up at her convenience.  She verbalized understanding and thanked me for helping her.

## 2017-06-27 NOTE — Telephone Encounter (Signed)
Follow Up Call. Mrs. Tiemeyer is calling about assistance for her medication . Please Call

## 2018-02-08 ENCOUNTER — Other Ambulatory Visit: Payer: Self-pay | Admitting: *Deleted

## 2018-02-08 MED ORDER — RIVAROXABAN 20 MG PO TABS: 20.0000 mg | ORAL_TABLET | Freq: Every day | ORAL | 1 refills | Status: DC

## 2018-03-03 ENCOUNTER — Other Ambulatory Visit: Payer: Self-pay | Admitting: Cardiovascular Disease

## 2018-03-03 DIAGNOSIS — I4891 Unspecified atrial fibrillation: Secondary | ICD-10-CM

## 2018-03-14 ENCOUNTER — Emergency Department (HOSPITAL_COMMUNITY)
Admission: EM | Admit: 2018-03-14 | Discharge: 2018-03-14 | Disposition: A | Discharge status: Home / Self Care | Payer: Medicaid Other | Attending: Emergency Medicine | Admitting: Emergency Medicine

## 2018-03-14 ENCOUNTER — Encounter (HOSPITAL_COMMUNITY): Payer: Self-pay | Admitting: Emergency Medicine

## 2018-03-14 ENCOUNTER — Other Ambulatory Visit: Payer: Self-pay

## 2018-03-14 ENCOUNTER — Emergency Department (HOSPITAL_COMMUNITY): Payer: Medicaid Other

## 2018-03-14 DIAGNOSIS — E0590 Thyrotoxicosis, unspecified without thyrotoxic crisis or storm: Secondary | ICD-10-CM | POA: Insufficient documentation

## 2018-03-14 DIAGNOSIS — Z7901 Long term (current) use of anticoagulants: Secondary | ICD-10-CM | POA: Diagnosis not present

## 2018-03-14 DIAGNOSIS — Z87891 Personal history of nicotine dependence: Secondary | ICD-10-CM | POA: Insufficient documentation

## 2018-03-14 DIAGNOSIS — Z79899 Other long term (current) drug therapy: Secondary | ICD-10-CM | POA: Diagnosis not present

## 2018-03-14 DIAGNOSIS — R0602 Shortness of breath: Secondary | ICD-10-CM

## 2018-03-14 DIAGNOSIS — I110 Hypertensive heart disease with heart failure: Secondary | ICD-10-CM | POA: Insufficient documentation

## 2018-03-14 DIAGNOSIS — I509 Heart failure, unspecified: Secondary | ICD-10-CM | POA: Insufficient documentation

## 2018-03-14 DIAGNOSIS — R0789 Other chest pain: Secondary | ICD-10-CM

## 2018-03-14 LAB — CBC
HCT: 44.5 % (ref 36.0–46.0)
Hemoglobin: 14 g/dL (ref 12.0–15.0)
MCH: 24.2 pg — ABNORMAL LOW (ref 26.0–34.0)
MCHC: 31.5 g/dL (ref 30.0–36.0)
MCV: 77 fL — ABNORMAL LOW (ref 78.0–100.0)
Platelets: 375 K/uL (ref 150–400)
RBC: 5.78 MIL/uL — ABNORMAL HIGH (ref 3.87–5.11)
RDW: 18.6 % — ABNORMAL HIGH (ref 11.5–15.5)
WBC: 6.4 K/uL (ref 4.0–10.5)

## 2018-03-14 LAB — I-STAT BETA HCG BLOOD, ED (MC, WL, AP ONLY): I-stat hCG, quantitative: <5 m[IU]/mL

## 2018-03-14 LAB — I-STAT TROPONIN, ED
Troponin i, poc: 0 ng/mL (ref 0.00–0.08)
Troponin i, poc: 0 ng/mL (ref 0.00–0.08)

## 2018-03-14 LAB — BASIC METABOLIC PANEL
Anion gap: 13 (ref 5–15)
BUN: 15 mg/dL (ref 6–20)
CALCIUM: 10.2 mg/dL (ref 8.9–10.3)
CO2: 23 mmol/L (ref 22–32)
Chloride: 102 mmol/L (ref 98–111)
Creatinine, Ser: 0.7 mg/dL (ref 0.44–1.00)
Glucose, Bld: 99 mg/dL (ref 70–99)
POTASSIUM: 4.5 mmol/L (ref 3.5–5.1)
SODIUM: 138 mmol/L (ref 135–145)

## 2018-03-14 LAB — TSH: TSH: <0.01 u[IU]/mL — ABNORMAL LOW (ref 0.350–4.500)

## 2018-03-14 LAB — T4, FREE: Free T4: 4.39 ng/dL — ABNORMAL HIGH (ref 0.82–1.77)

## 2018-03-14 LAB — D-DIMER, QUANTITATIVE: D-Dimer, Quant: 0.33 ug/mL-FEU (ref 0.00–0.50)

## 2018-03-14 MED ORDER — SODIUM CHLORIDE 0.9 % IV BOLUS
1000.0000 mL | Freq: Once | INTRAVENOUS | Status: AC
  Administered 2018-03-14: 1000 mL via INTRAVENOUS
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
## 2018-03-14 NOTE — Discharge Instructions (Addendum)
Please call your primary care provider as soon as you leave the emergency department to schedule a follow-up appointment regarding your visit today.  Your symptoms are most likely from to your hyperthyroidism, you are being managed by your primary care provider for this problem and you need to be reevaluated by them as soon as you can.  Please be sure to take all of your medications as prescribed by your primary care provider for your hyperthyroidism and hypertension as well as your Xarelto. Please return to the emergency department for any new or worsening symptoms.  Contact a health care provider if: Your symptoms do not get better with treatment. You have fever. You are taking thyroid replacement medicine and you: Have depression. Feel mentally and physically slow. Have weight gain. Get help right away if: You have decreased alertness or a change in your awareness. You have abdominal pain. You feel dizzy. You have a rapid heartbeat. You have an irregular heartbeat. Contact a doctor if: Your condition does not get better as soon as expected. You have a hard time doing your normal activities, even after you rest. You have new symptoms. Get help right away if: You have trouble breathing when you are resting. You feel light-headed or you faint. You have a cough that is not helped by medicines. You cough up blood. You have pain with breathing. You have pain in your chest, arms, shoulders, or belly (abdomen). You have a fever. You cannot walk up stairs. You cannot exercise the way you normally do.

## 2018-03-14 NOTE — ED Notes (Signed)
Patient verbalizes understanding of discharge instructions. Opportunity for questioning and answers were provided. Armband removed by staff, pt discharged from ED.  

## 2018-03-14 NOTE — ED Triage Notes (Signed)
Patient complains of shortness of breath and chest pain that started last night while laying down, states shortness of breath persisted after changing position. Denies other complaints at this time.

## 2018-03-22 ENCOUNTER — Encounter: Payer: Self-pay | Admitting: Nurse Practitioner

## 2018-03-22 ENCOUNTER — Ambulatory Visit (INDEPENDENT_AMBULATORY_CARE_PROVIDER_SITE_OTHER): Payer: Self-pay | Admitting: Nurse Practitioner

## 2018-03-22 ENCOUNTER — Other Ambulatory Visit: Payer: Self-pay

## 2018-03-22 ENCOUNTER — Encounter

## 2018-03-22 VITALS — BP 140/80 | HR 66 | Wt 179.8 lb

## 2018-03-22 DIAGNOSIS — O903 Peripartum cardiomyopathy: Secondary | ICD-10-CM

## 2018-03-22 DIAGNOSIS — Z79899 Other long term (current) drug therapy: Secondary | ICD-10-CM

## 2018-03-22 DIAGNOSIS — I10 Essential (primary) hypertension: Secondary | ICD-10-CM

## 2018-03-22 DIAGNOSIS — Z7901 Long term (current) use of anticoagulants: Secondary | ICD-10-CM

## 2018-03-22 MED ORDER — RIVAROXABAN 20 MG PO TABS: ORAL_TABLET | ORAL | 6 refills | Status: DC

## 2018-03-22 NOTE — Patient Instructions (Addendum)
We will be checking the following labs today - NONE   Medication Instructions:    Continue with your current medicines.   I have sent in your refill for Xarelto    Testing/Procedures To Be Arranged:  N/A  Follow-Up:   See me in 6 months    Other Special Instructions:   N/A    If you need a refill on your cardiac medications before your next appointment, please call your pharmacy.   Call the [facility] office at [phone] if you have any questions, problems or concerns.

## 2018-03-22 NOTE — Progress Notes (Signed)
CARDIOLOGY OFFICE NOTE  Date:  03/22/2018    Marilyn Norman Date of Birth: 10-07-1990 Medical Record #[id number]  PCP:  Barbarann Ehlers  Cardiologist:  Eden Emms    Chief Complaint  Patient presents with  . Atrial Fibrillation  . Congestive Heart Failure    Follow up visit - seen for Dr. Eden Emms    History of Present Illness: Marilyn Norman is a 27 y.o. female who presents today for a 1 1/2 year check. Seen for Dr. Eden Emms.   She has a history of AF with RVR (in the setting of noncompliance with methimazole), prior peripartum cardiomyopathy in 2015, Graves' disease, and chronic anticoagulation with Xarelto. Echo from 2017 with EF of 40 to 45%.   She has not been seen by Dr. Eden Emms since 2013.   She was seen by Nada Boozer, NP in January of 2018 - felt to be doing ok. Was compliant with her medicines.   In the ER last week - had atypical chest pain - felt to be related to her hyperthyroidism - noted that she has not been able to see her Endocrinologist due to having Medicaid but could benefit from iodine ablation of her thyroid - trying to get patient assistance to facilitate visit.   Comes in today. Here with her sister. Needs refill of Xarelto. No medicines taken yet today - typically takes later in the day. Continues to smoke. She says she feels fine. No more chest pain. Still waiting on a call about patient assistance to get back to Endocrinology for probable ablation of her thyroid. She is not short of breath. She continues to smoke. No bleeding or bruising.   Past Medical History:  Diagnosis Date  . A-fib (HCC)   . Cardiomegaly   . Chest pain   . Chlamydia   . Costochondritis   . Graves' eye disease   . Hypertension   . Hyperthyroidism   . Pre-eclampsia    with each pregnancy  . Thyroid disease    hyperthyroid  . Trichomonas infection     Past Surgical History:  Procedure Laterality Date  . CESAREAN SECTION    . CESAREAN SECTION WITH BILATERAL TUBAL  LIGATION N/A 01/22/2014   Procedure: REPEAT CESAREAN SECTION WITH BILATERAL TUBAL LIGATION;  Surgeon: Kathreen Cosier, MD;  Location: [facility];  Service: Obstetrics;  Laterality: N/A;  . IUD REMOVAL       Medications: Current Meds  Medication Sig  . acetaminophen (TYLENOL) 500 MG tablet Take 1,000 mg by mouth every 6 (six) hours as needed for mild pain, moderate pain, fever or headache.  Marland Kitchen amLODipine (NORVASC) 10 MG tablet Take 1 tablet (10 mg total) by mouth daily.  Marland Kitchen lisinopril (PRINIVIL,ZESTRIL) 20 MG tablet Take 1 tablet (20 mg total) by mouth 2 (two) times daily.  . methimazole (TAPAZOLE) 10 MG tablet Take 6 tablets (60 mg total) by mouth daily.  . metoprolol succinate (TOPROL-XL) 200 MG 24 hr tablet Take 1 tablet (200 mg total) by mouth daily. Take with or immediately following a meal.  . rivaroxaban (XARELTO) 20 MG TABS tablet TAKE 1 TABLET (20 MG TOTAL) BY MOUTH DAILY WITH SUPPER.  . [DISCONTINUED] XARELTO 20 MG TABS tablet TAKE 1 TABLET (20 MG TOTAL) BY MOUTH DAILY WITH SUPPER.     Allergies: Allergies  Allergen Reactions  . Strawberry Extract Anaphylaxis    Social History: The patient  reports that she quit smoking about 4 years ago. She has never used  smokeless tobacco. She reports that she has current or past drug history. Drug: Marijuana. She reports that she does not drink alcohol.   Family History: The patient's family history includes Asthma in her mother; Heart disease in her father.   Review of Systems: Please see the history of present illness.   Otherwise, the review of systems is positive for none.   All other systems are reviewed and negative.   Physical Exam: VS:  BP 140/80 (BP Location: Left Arm, Patient Position: Sitting, Cuff Size: Normal)   Pulse 66   Wt 179 lb 12.8 oz (81.6 kg)   LMP 02/20/2018 (Exact Date)   SpO2 100% Comment: at rest  BMI 30.86 kg/m  .  BMI Body mass index is 30.86 kg/m.  Wt Readings from Last 3 Encounters:  03/22/18 179 lb  12.8 oz (81.6 kg)  09/03/16 179 lb (81.2 kg)  08/24/16 171 lb 8 oz (77.8 kg)   BP is 140/80 by me as well.   General: Pleasant. Well developed, well nourished and in no acute distress.   HEENT: Normal.  Neck: Supple, no JVD, carotid bruits, or masses noted.  Cardiac: Regular rate and rhythm. HR is 76 by my exam. No murmurs, rubs, or gallops. No edema.  Respiratory:  Lungs are clear to auscultation bilaterally with normal work of breathing.  GI: Soft and nontender.  MS: No deformity or atrophy. Gait and ROM intact.  Skin: Warm and dry. Color is normal.  Neuro:  Strength and sensation are intact and no gross focal deficits noted.  Psych: Alert, appropriate and with normal affect.   LABORATORY DATA:  EKG:  EKG is not ordered today. EKG from ER visit last week with sinus tach and otherwise unchanged.   Lab Results  Component Value Date   WBC 6.4 03/14/2018   HGB 14.0 03/14/2018   HCT 44.5 03/14/2018   PLT 375 03/14/2018   GLUCOSE 99 03/14/2018   ALT 49 (H) 09/03/2016   AST 40 09/03/2016   NA 138 03/14/2018   K 4.5 03/14/2018   CL 102 03/14/2018   CREATININE 0.70 03/14/2018   BUN 15 03/14/2018   CO2 23 03/14/2018   TSH <0.010 (L) 03/14/2018   INR 1.08 08/20/2016     BNP (last 3 results) No results for input(s): BNP in the last 8760 hours.  ProBNP (last 3 results) No results for input(s): PROBNP in the last 8760 hours.   Other Studies Reviewed Today:  Echo Study Conclusions from 07/2016  - Left ventricle: The cavity size was normal. There was mild   concentric hypertrophy. Systolic function was mildly to   moderately reduced. The estimated ejection fraction was in the   range of 40% to 45%. There is akinesis of the basal-midlateral   and inferolateral myocardium. Features are consistent with a   pseudonormal left ventricular filling pattern, with concomitant   abnormal relaxation and increased filling pressure (grade 2   diastolic dysfunction). - Aortic valve:  There was mild regurgitation. - Mitral valve: There was moderate regurgitation. - Left atrium: The atrium was mildly dilated. - Pulmonary arteries: Systolic pressure was moderately increased.   PA peak pressure: 47 mm Hg (S).  Impressions:  - Compared to the prior study, there has been no significant   interval change.  Assessment/Plan:  1.  Past episode of AF - felt to be related to Grave's disease - this was treated with adenosine - she remains on Xarelto and beta blocker. HR is controlled today and  she is in NSR by exam. No changes made.   2.  HTN - running little higher - suspected to be due to her hyperthyroidism - this should improve as treatment ensues.   3. Grave's disease - trying to get assistance to get back to Endocrine. Hopefully this will be soon - explained that complications will arise the longer we go without adequate treatment.   4. CM with EF 45% - no symptoms. Clinically doing well.   5. Tobacco use - total cessation encouraged.     Current medicines are reviewed with the patient today.  The patient does not have concerns regarding medicines other than what has been noted above.  The following changes have been made:  See above.  Labs/ tests ordered today include:   No orders of the defined types were placed in this encounter.    Disposition:   FU with me in 6 months.   Patient is agreeable to this plan and will call if any problems develop in the interim.   SignedNorma Fredrickson, NP  03/22/2018 10:00 AM  [facility] [address] [place], [place]  [address] Phone: [phone] Fax: [phone]

## 2018-05-10 ENCOUNTER — Encounter: Payer: Self-pay | Admitting: *Deleted

## 2018-08-14 ENCOUNTER — Telehealth: Payer: Self-pay | Admitting: Nurse Practitioner

## 2018-08-14 NOTE — Telephone Encounter (Signed)
Patient calling the office for samples of medication:   1.  What medication and dosage are you requesting samples for?  xarelto 20  2.  Are you currently out of this medication? Yes

## 2018-08-14 NOTE — Telephone Encounter (Signed)
Called Marilyn Norman and inform Marilyn Norman that I would be leaving a bottle of Xarelto 20 mg tablet at the front desk for Marilyn Norman to pick up and if Marilyn Norman has any other problems, questions or concerns to call the office. Marilyn Norman verbalized understanding.

## 2018-09-05 ENCOUNTER — Other Ambulatory Visit: Payer: Self-pay | Admitting: Physician Assistant

## 2018-09-05 DIAGNOSIS — N6320 Unspecified lump in the left breast, unspecified quadrant: Secondary | ICD-10-CM

## 2018-09-08 ENCOUNTER — Emergency Department (HOSPITAL_COMMUNITY): Payer: Medicaid Other

## 2018-09-08 ENCOUNTER — Emergency Department (HOSPITAL_COMMUNITY)
Admission: EM | Admit: 2018-09-08 | Discharge: 2018-09-08 | Disposition: A | Discharge status: Home / Self Care | Payer: Medicaid Other | Attending: Emergency Medicine | Admitting: Emergency Medicine

## 2018-09-08 ENCOUNTER — Encounter (HOSPITAL_COMMUNITY): Payer: Self-pay

## 2018-09-08 DIAGNOSIS — D649 Anemia, unspecified: Secondary | ICD-10-CM | POA: Insufficient documentation

## 2018-09-08 DIAGNOSIS — Z79899 Other long term (current) drug therapy: Secondary | ICD-10-CM | POA: Insufficient documentation

## 2018-09-08 DIAGNOSIS — R079 Chest pain, unspecified: Secondary | ICD-10-CM | POA: Diagnosis present

## 2018-09-08 DIAGNOSIS — I110 Hypertensive heart disease with heart failure: Secondary | ICD-10-CM | POA: Diagnosis not present

## 2018-09-08 DIAGNOSIS — Z87891 Personal history of nicotine dependence: Secondary | ICD-10-CM | POA: Diagnosis not present

## 2018-09-08 DIAGNOSIS — I493 Ventricular premature depolarization: Secondary | ICD-10-CM | POA: Diagnosis not present

## 2018-09-08 DIAGNOSIS — I4891 Unspecified atrial fibrillation: Secondary | ICD-10-CM | POA: Diagnosis not present

## 2018-09-08 DIAGNOSIS — I5042 Chronic combined systolic (congestive) and diastolic (congestive) heart failure: Secondary | ICD-10-CM | POA: Insufficient documentation

## 2018-09-08 LAB — BASIC METABOLIC PANEL
ANION GAP: 8 (ref 5–15)
BUN: 7 mg/dL (ref 6–20)
CO2: 23 mmol/L (ref 22–32)
Calcium: 8.5 mg/dL — ABNORMAL LOW (ref 8.9–10.3)
Chloride: 110 mmol/L (ref 98–111)
Creatinine, Ser: 0.74 mg/dL (ref 0.44–1.00)
Glucose, Bld: 91 mg/dL (ref 70–99)
POTASSIUM: 4.1 mmol/L (ref 3.5–5.1)
SODIUM: 141 mmol/L (ref 135–145)

## 2018-09-08 LAB — I-STAT BETA HCG BLOOD, ED (MC, WL, AP ONLY): I-stat hCG, quantitative: <5 m[IU]/mL (ref <5)

## 2018-09-08 LAB — I-STAT TROPONIN, ED: TROPONIN I, POC: 0 ng/mL (ref 0.00–0.08)

## 2018-09-08 LAB — CBC
HCT: 32.3 % — ABNORMAL LOW (ref 36.0–46.0)
HEMOGLOBIN: 9.7 g/dL — AB (ref 12.0–15.0)
MCH: 24 pg — ABNORMAL LOW (ref 26.0–34.0)
MCHC: 30 g/dL (ref 30.0–36.0)
MCV: 80 fL (ref 80.0–100.0)
Platelets: 253 10*3/uL (ref 150–400)
RBC: 4.04 MIL/uL (ref 3.87–5.11)
RDW: 16.9 % — ABNORMAL HIGH (ref 11.5–15.5)
WBC: 5.2 10*3/uL (ref 4.0–10.5)
nRBC: 0 % (ref 0.0–0.2)

## 2018-09-08 MED ORDER — SODIUM CHLORIDE 0.9% FLUSH: 3.0000 mL | Freq: Once | INTRAVENOUS | Status: DC
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
## 2018-09-08 NOTE — ED Triage Notes (Signed)
Pt presents for evaluation of chest tightness x 3 days. Reports feeling palpitations as well. Hx of afib.

## 2018-09-08 NOTE — ED Notes (Signed)
Patient verbalizes understanding of discharge instructions. Opportunity for questioning and answers were provided. Armband removed by staff, pt discharged from ED ambulatory.   

## 2018-09-08 NOTE — Discharge Instructions (Addendum)
Decrease your Metoprolol to 100 mg a day instead of 200mg .   Schedule to see Cardiology for recheck.   Schedule appointment at [facility] clinic for heavy menses.

## 2018-09-12 ENCOUNTER — Other Ambulatory Visit: Payer: Self-pay | Admitting: Physician Assistant

## 2018-09-15 ENCOUNTER — Ambulatory Visit
Admission: RE | Admit: 2018-09-15 | Discharge: 2018-09-15 | Disposition: A | Discharge status: Home / Self Care | Payer: Medicaid Other | Source: Ambulatory Visit | Attending: Physician Assistant | Admitting: Physician Assistant

## 2018-09-15 DIAGNOSIS — N6320 Unspecified lump in the left breast, unspecified quadrant: Secondary | ICD-10-CM

## 2018-09-19 ENCOUNTER — Encounter: Payer: Self-pay | Admitting: Student

## 2018-09-19 ENCOUNTER — Ambulatory Visit (INDEPENDENT_AMBULATORY_CARE_PROVIDER_SITE_OTHER): Payer: Medicaid Other | Admitting: Family Medicine

## 2018-09-19 VITALS — BP 198/101 | HR 73 | Ht 65.0 in | Wt 177.2 lb

## 2018-09-19 DIAGNOSIS — I10 Essential (primary) hypertension: Secondary | ICD-10-CM | POA: Diagnosis not present

## 2018-09-19 DIAGNOSIS — N939 Abnormal uterine and vaginal bleeding, unspecified: Secondary | ICD-10-CM | POA: Diagnosis present

## 2018-09-19 DIAGNOSIS — D500 Iron deficiency anemia secondary to blood loss (chronic): Secondary | ICD-10-CM

## 2018-09-19 DIAGNOSIS — Z7901 Long term (current) use of anticoagulants: Secondary | ICD-10-CM

## 2018-09-19 MED ORDER — NORETHINDRONE 0.35 MG PO TABS: 1.0000 | ORAL_TABLET | Freq: Every day | ORAL | 4 refills | Status: DC

## 2018-09-19 MED ORDER — FERROUS SULFATE 325 (65 FE) MG PO TABS: 325.0000 mg | ORAL_TABLET | Freq: Every day | ORAL | 3 refills | Status: DC
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
## 2018-10-03 ENCOUNTER — Ambulatory Visit: Payer: Medicaid Other | Admitting: Nurse Practitioner

## 2018-10-10 ENCOUNTER — Ambulatory Visit: Payer: Medicaid Other | Admitting: Nurse Practitioner

## 2018-10-10 ENCOUNTER — Encounter: Payer: Self-pay | Admitting: Nurse Practitioner

## 2018-10-10 VITALS — BP 160/100 | HR 84 | Ht 65.0 in | Wt 177.1 lb

## 2018-10-10 DIAGNOSIS — I428 Other cardiomyopathies: Secondary | ICD-10-CM

## 2018-10-10 MED ORDER — METOPROLOL SUCCINATE ER 200 MG PO TB24: 100.0000 mg | ORAL_TABLET | Freq: Every day | ORAL | 0 refills | Status: DC

## 2018-10-10 MED ORDER — AMLODIPINE BESYLATE 5 MG PO TABS: 5.0000 mg | ORAL_TABLET | Freq: Every day | ORAL | 3 refills | Status: DC

## 2018-10-10 MED ORDER — RIVAROXABAN 20 MG PO TABS: ORAL_TABLET | ORAL | 6 refills | Status: DC

## 2018-10-10 NOTE — Progress Notes (Signed)
CARDIOLOGY OFFICE NOTE  Date:  10/10/2018    Marilyn Norman Date of Birth: 10/04/1990 Medical Record #[id number]  PCP:  Marilyn Norman  Cardiologist:  Marilyn Norman    Chief Complaint  Patient presents with  . Atrial Fibrillation  . Cardiomyopathy    Follow up visit.     History of Present Illness: Marilyn Norman is a 28 y.o. female who presents today for a follow up visit. Seen for Dr. Eden Norman.   She has a history of AF with RVR (in the setting of noncompliance with methimazole), prior peripartum cardiomyopathy in 2015, Graves' disease, and chronic anticoagulation with Xarelto. Echo from 2017 with EF of 40 to 45%.   She has not been seen by Dr. Eden Norman since 2013.   She was seen by Marilyn Boozer, NP in January of 2018 - felt to be doing ok. Was compliant with her medicines.   I then saw her in July of 2019 - following ER visit for chest pain - felt to be related to her hyperthyroidism. She was not able to see Endocrine due to having Medicaid but noted that could benefit from iodine ablation of her thyroid. She was trying to get patient assistance for this. She continued to smoke.   Comes in today. Here with her mother today. She had her iodine ablation just a few weeks ago. She is off her Tapazole. She is not taking her Norvasc - sounds like she lost her Medicaid for a while - could not afford - then says it made her have some chest pain - she was on the higher dose. She is on 1/2 dose of her Toprol per her report. She is able to get her Xarelto thru the indigent program. She says her BP is low - but the numbers she reports are all high. BP is high here today. She continues to smoke - does not quantify. She is agreeable to retrying lower dose of Norvasc. She says she has been taking all of her other medicines.   Past Medical History:  Diagnosis Date  . A-fib (HCC)   . Cardiomegaly   . Chest pain   . Chlamydia   . Costochondritis   . Graves' eye disease     . Hypertension   . Hyperthyroidism   . Pre-eclampsia    with each pregnancy  . Thyroid disease    hyperthyroid  . Trichomonas infection     Past Surgical History:  Procedure Laterality Date  . CESAREAN SECTION    . CESAREAN SECTION WITH BILATERAL TUBAL LIGATION N/A 01/22/2014   Procedure: REPEAT CESAREAN SECTION WITH BILATERAL TUBAL LIGATION;  Surgeon: Kathreen Cosier, MD;  Location: [facility];  Service: Obstetrics;  Laterality: N/A;  . IUD REMOVAL       Medications: Current Meds  Medication Sig  . acetaminophen (TYLENOL) 500 MG tablet Take 1,000 mg by mouth every 6 (six) hours as needed for mild pain, moderate pain, fever or headache.  . ferrous sulfate 325 (65 FE) MG tablet Take 1 tablet (325 mg total) by mouth daily with supper.  Marland Kitchen lisinopril (PRINIVIL,ZESTRIL) 20 MG tablet Take 1 tablet (20 mg total) by mouth 2 (two) times daily.  . metoprolol (TOPROL-XL) 200 MG 24 hr tablet Take 0.5 tablets (100 mg total) by mouth daily. Take with or immediately following a meal.  . norethindrone (MICRONOR,CAMILA,ERRIN) 0.35 MG tablet Take 1 tablet (0.35 mg total) by mouth daily.  . predniSONE (DELTASONE) 10 MG  tablet START 09/23/18: TAKE 3 TABS DAILY X 2 WEEKS, THEN 2 TABS DAILY X 2 WEEKS, THEN 1 TAB DAILY X2 WEEKS.  . rivaroxaban (XARELTO) 20 MG TABS tablet TAKE 1 TABLET (20 MG TOTAL) BY MOUTH DAILY WITH SUPPER.  . [DISCONTINUED] metoprolol succinate (TOPROL-XL) 200 MG 24 hr tablet Take 1 tablet (200 mg total) by mouth daily. Take with or immediately following a meal.     Allergies: Allergies  Allergen Reactions  . Strawberry Extract Anaphylaxis    Social History: The patient  reports that she quit smoking about 4 years ago. She has never used smokeless tobacco. She reports current drug use. Drug: Marijuana. She reports that she does not drink alcohol.   Family History: The patient's family history includes Asthma in her mother; Heart disease in her father.   Review of  Systems: Please see the history of present illness.   Otherwise, the review of systems is positive for none.   All other systems are reviewed and negative.   Physical Exam: VS:  BP (!) 160/100 (BP Location: Left Arm, Patient Position: Sitting, Cuff Size: Normal)   Pulse 84   Ht 5\' 5"  (1.651 m)   Wt 177 lb 1.9 oz (80.3 kg)   SpO2 100% Comment: at rest  BMI 29.47 kg/m  .  BMI Body mass index is 29.47 kg/m.  Wt Readings from Last 3 Encounters:  10/10/18 177 lb 1.9 oz (80.3 kg)  09/19/18 177 lb 3.2 oz (80.4 kg)  03/22/18 179 lb 12.8 oz (81.6 kg)   BP is 160/100 by me.   General: Pleasant. Well developed, well nourished and in no acute distress.  She smells of tobacco.  HEENT: Normal.  Neck: Supple, no JVD, carotid bruits, or masses noted.  Cardiac: Regular rate and rhythm. No murmurs, rubs, or gallops. No edema.  Respiratory:  Lungs are clear to auscultation bilaterally with normal work of breathing.  GI: Soft and nontender.  MS: No deformity or atrophy. Gait and ROM intact.  Skin: Warm and dry. Color is normal.  Neuro:  Strength and sensation are intact and no gross focal deficits noted.  Psych: Alert, appropriate and with normal affect.   LABORATORY DATA:  EKG:  EKG is not ordered today.  Lab Results  Component Value Date   WBC 5.2 09/08/2018   HGB 9.7 (L) 09/08/2018   HCT 32.3 (L) 09/08/2018   PLT 253 09/08/2018   GLUCOSE 91 09/08/2018   ALT 49 (H) 09/03/2016   AST 40 09/03/2016   NA 141 09/08/2018   K 4.1 09/08/2018   CL 110 09/08/2018   CREATININE 0.74 09/08/2018   BUN 7 09/08/2018   CO2 23 09/08/2018   TSH <0.010 (L) 03/14/2018   INR 1.08 08/20/2016     BNP (last 3 results) No results for input(s): BNP in the last 8760 hours.  ProBNP (last 3 results) No results for input(s): PROBNP in the last 8760 hours.   Other Studies Reviewed Today:  Echo Study Conclusions from 07/2016  - Left ventricle: The cavity size was normal. There was mild concentric  hypertrophy. Systolic function was mildly to moderately reduced. The estimated ejection fraction was in the range of 40% to 45%. There is akinesis of the basal-midlateral and inferolateral myocardium. Features are consistent with a pseudonormal left ventricular filling pattern, with concomitant abnormal relaxation and increased filling pressure (grade 2 diastolic dysfunction). - Aortic valve: There was mild regurgitation. - Mitral valve: There was moderate regurgitation. - Left atrium: The atrium  was mildly dilated. - Pulmonary arteries: Systolic pressure was moderately increased. PA peak pressure: 47 mm Hg (S).  Impressions:  - Compared to the prior study, there has been no significant interval change.  Assessment/Plan:  1.Past episode of AF - felt to be related to Grave's disease - this was treated with adenosine - she was put on Xarelto and beta blocker. She has remained in NSR. She has now had her iodine ablation of her thyroid. My question is if she will need long term anticoagulation going forward now that her hyperthyroidism has been treated??   2. HTN - BP is not controlled - she is agreeable to restarting the Norvasc at 5 mg a day. I still worry about her compliance but she says she is taking everything else.   3. Grave's disease - she is now seeing Endocrine - off Tapazole and has had her iodine ablation.   4. CM with EF 45% - no symptoms. Clinically doing well. Would like to get her echo updated to help guide therapy going forward - see #1.   5. Tobacco use - total cessation again encouraged.     Current medicines are reviewed with the patient today.  The patient does not have concerns regarding medicines other than what has been noted above.  The following changes have been made:  See above.  Labs/ tests ordered today include:   No orders of the defined types were placed in this encounter.    Disposition:   FU with Dr. Eden Norman after echo  in 3 months for discussion of long term anticoagulation now that she has had her thyroid ablated.    Patient is agreeable to this plan and will call if any problems develop in the interim.   SignedNorma Fredrickson, NP  10/10/2018 10:10 AM  [facility] [address] [place], [place]  [address] Phone: [phone] Fax: [phone]

## 2018-10-10 NOTE — Patient Instructions (Addendum)
We will be checking the following labs today - NONE   Medication Instructions:    Continue with your current medicines. BUT  I am restarting the Norvasc at 5 mg a day - this is at the drug store  I am refilling the Xarelto today.    If you need a refill on your cardiac medications before your next appointment, please call your pharmacy.     Testing/Procedures To Be Arranged:  Echo in 3 months  Follow-Up:   See Dr. Eden Emms a few days after the echo to discuss long term plans for Xarelto    At [facility], you and your health needs are our priority.  As part of our continuing mission to provide you with exceptional heart care, we have created designated Provider Care Teams.  These Care Teams include your primary Cardiologist (physician) and Advanced Practice Providers (APPs -  Physician Assistants and Nurse Practitioners) who all work together to provide you with the care you need, when you need it.  Special Instructions:  . None  Call the [facility] office at [phone] if you have any questions, problems or concerns.

## 2018-11-03 ENCOUNTER — Telehealth: Payer: Self-pay | Admitting: *Deleted

## 2018-11-03 NOTE — Telephone Encounter (Signed)
Patient called and stated that she is out of xarelto and per [facility] the medication requires a PA. Patient aware that one week of samples will be placed at the front desk and that a msg will be routed to the nurse that handles the PA's. She verbalized her understanding and appreciation.

## 2018-11-03 NOTE — Telephone Encounter (Signed)
**Note De-Identified Nikol Lemar Obfuscation** I called [employer] and s/w Jovana who advised me that a PA is not required for the pts Xarelto as it is the preferred medication on the pts plan.  I called the pt and made her aware. She states that her [facility] ([facility]) is insisting that they have sent [place] several faxes concerning this PA.  I called [facility] and advised them that we have not received any request for a Xarelto PA and that it should not need a PA according to [employer]. They ran the RX while I was on the phone and stated that they didn't know what the issues was but that it is going through now.   They state that they will fill the pts Xarelto and will notify the pt.

## 2018-12-29 ENCOUNTER — Telehealth (HOSPITAL_COMMUNITY): Payer: Self-pay | Admitting: Radiology

## 2018-12-29 ENCOUNTER — Telehealth: Payer: Self-pay

## 2018-12-29 NOTE — Telephone Encounter (Signed)

## 2018-12-29 NOTE — Telephone Encounter (Signed)
Tried to call patient about virtual visit on 01/10/19. No voicemail to leave message, will try again at a later time.

## 2019-01-01 ENCOUNTER — Other Ambulatory Visit: Payer: Self-pay

## 2019-01-01 ENCOUNTER — Ambulatory Visit (HOSPITAL_COMMUNITY): Payer: Medicaid Other | Attending: Cardiology

## 2019-01-01 DIAGNOSIS — I428 Other cardiomyopathies: Secondary | ICD-10-CM | POA: Diagnosis present

## 2019-01-02 NOTE — Telephone Encounter (Signed)
Virtual Visit Pre-Appointment Phone Call  "(Name), I am calling you today to discuss your upcoming appointment. We are currently trying to limit exposure to the virus that causes COVID-19 by seeing patients at home rather than in the office."  1. "What is the BEST phone number to call the day of the visit?" - include this in appointment notes  2. "Do you have or have access to (through a family member/friend) a smartphone with video capability that we can use for your visit?" a. If yes - list this number in appt notes as "cell" (if different from BEST phone #) and list the appointment type as a VIDEO visit in appointment notes b. If no - list the appointment type as a PHONE visit in appointment notes  3. Confirm consent - "In the setting of the current Covid19 crisis, you are scheduled for a (phone or video) visit with your provider on (date) at (time).  Just as we do with many in-office visits, in order for you to participate in this visit, we must obtain consent.  If you'd like, I can send this to your mychart (if signed up) or email for you to review.  Otherwise, I can obtain your verbal consent now.  All virtual visits are billed to your insurance company just like a normal visit would be.  By agreeing to a virtual visit, we'd like you to understand that the technology does not allow for your provider to perform an examination, and thus may limit your provider's ability to fully assess your condition. If your provider identifies any concerns that need to be evaluated in person, we will make arrangements to do so.  Finally, though the technology is pretty good, we cannot assure that it will always work on either your or our end, and in the setting of a video visit, we may have to convert it to a phone-only visit.  In either situation, we cannot ensure that we have a secure connection.  Are you willing to proceed? Yes  4. Advise patient to be prepared - "Two hours prior to your appointment, go  ahead and check your blood pressure, pulse, oxygen saturation, and your weight (if you have the equipment to check those) and write them all down. When your visit starts, your provider will ask you for this information. If you have an Apple Watch or Kardia device, please plan to have heart rate information ready on the day of your appointment. Please have a pen and paper handy nearby the day of the visit as well."  5. Give patient instructions for MyChart download to smartphone OR Doximity/[email or web address] as below if video visit (depending on what platform provider is using)  6. Inform patient they will receive a phone call 15 minutes prior to their appointment time (may be from unknown caller ID) so they should be prepared to answer    TELEPHONE CALL NOTE  Marilyn Norman has been deemed a candidate for a follow-up tele-health visit to limit community exposure during the Covid-19 pandemic. I spoke with the patient via phone to ensure availability of phone/video source, confirm preferred email & phone number, and discuss instructions and expectations.  I reminded Marilyn Norman to be prepared with any vital sign and/or heart rhythm information that could potentially be obtained via home monitoring, at the time of her visit. I reminded Marilyn Norman to expect a phone call prior to her visit.  Marilyn Chick, RN 01/02/2019 2:12 PM   IF  USING DOXIMITY or DOXY.ME - The patient will receive a link just prior to their visit by text.     FULL LENGTH CONSENT FOR TELE-HEALTH VISIT   I hereby voluntarily request, consent and authorize [facility] and its employed or contracted physicians, physician assistants, nurse practitioners or other licensed health care professionals (the Practitioner), to provide me with telemedicine health care services (the "Services") as deemed necessary by the treating Practitioner. I acknowledge and consent to receive the Services by the Practitioner via telemedicine.  I understand that the telemedicine visit will involve communicating with the Practitioner through live audiovisual communication technology and the disclosure of certain medical information by electronic transmission. I acknowledge that I have been given the opportunity to request an in-person assessment or other available alternative prior to the telemedicine visit and am voluntarily participating in the telemedicine visit.  I understand that I have the right to withhold or withdraw my consent to the use of telemedicine in the course of my care at any time, without affecting my right to future care or treatment, and that the Practitioner or I may terminate the telemedicine visit at any time. I understand that I have the right to inspect all information obtained and/or recorded in the course of the telemedicine visit and may receive copies of available information for a reasonable fee.  I understand that some of the potential risks of receiving the Services via telemedicine include:  Marland Kitchen Delay or interruption in medical evaluation due to technological equipment failure or disruption; . Information transmitted may not be sufficient (e.g. poor resolution of images) to allow for appropriate medical decision making by the Practitioner; and/or  . In rare instances, security protocols could fail, causing a breach of personal health information.  Furthermore, I acknowledge that it is my responsibility to provide information about my medical history, conditions and care that is complete and accurate to the best of my ability. I acknowledge that Practitioner's advice, recommendations, and/or decision may be based on factors not within their control, such as incomplete or inaccurate data provided by me or distortions of diagnostic images or specimens that may result from electronic transmissions. I understand that the practice of medicine is not an exact science and that Practitioner makes no warranties or guarantees  regarding treatment outcomes. I acknowledge that I will receive a copy of this consent concurrently upon execution via email to the email address I last provided but may also request a printed copy by calling the office of [facility].    I understand that my insurance will be billed for this visit.   I have read or had this consent read to me. . I understand the contents of this consent, which adequately explains the benefits and risks of the Services being provided via telemedicine.  . I have been provided ample opportunity to ask questions regarding this consent and the Services and have had my questions answered to my satisfaction. . I give my informed consent for the services to be provided through the use of telemedicine in my medical care  By participating in this telemedicine visit I agree to the above.

## 2019-01-02 NOTE — Progress Notes (Signed)
Virtual Visit via Video Note   This visit type was conducted due to national recommendations for restrictions regarding the COVID-19 Pandemic (e.g. social distancing) in an effort to limit this patient's exposure and mitigate transmission in our community.  Due to her co-morbid illnesses, this patient is at least at moderate risk for complications without adequate follow up.  This format is felt to be most appropriate for this patient at this time.  All issues noted in this document were discussed and addressed.  A limited physical exam was performed with this format.  Please refer to the patient's chart for her consent to telehealth for [facility].   Date:  01/10/2019   ID:  Marilyn Norman, DOB 1990-10-05, MRN [id number]  Patient Location: Home Provider Location: Office  PCP:  Jordan Hawks, PA-C  Cardiologist:  Norma Fredrickson / Avel Ogawa  Electrophysiologist:  None   Evaluation Performed:  Follow-Up Visit  Chief Complaint:  PAF/ DCM  History of Present Illness:    28 y.o. last seen by me in 2013. 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    Past Medical History:  Diagnosis Date  . A-fib (HCC)   . Cardiomegaly   . Chest pain   . Chlamydia   . Costochondritis   . Graves' eye disease   .  Hypertension   . Hyperthyroidism   . Pre-eclampsia    with each pregnancy  . Thyroid disease    hyperthyroid  . Trichomonas infection    Past Surgical History:  Procedure Laterality Date  . CESAREAN SECTION    . CESAREAN SECTION WITH BILATERAL TUBAL LIGATION N/A 01/22/2014   Procedure: REPEAT CESAREAN SECTION WITH BILATERAL TUBAL LIGATION;  Surgeon: Kathreen Cosier, MD;  Location: [facility];  Service: Obstetrics;  Laterality: N/A;  . IUD REMOVAL       Current Meds  Medication Sig  . acetaminophen (TYLENOL) 500 MG tablet Take 1,000 mg by mouth every 6 (six) hours as needed for mild pain, moderate pain, fever or headache.  Marland Kitchen amLODipine (NORVASC) 5 MG tablet Take 1 tablet (5 mg total) by mouth daily.  . ferrous sulfate 325 (65 FE) MG tablet Take 1 tablet (325 mg total) by mouth daily with supper.  Marland Kitchen lisinopril (PRINIVIL,ZESTRIL) 20 MG tablet Take 1 tablet (20 mg total) by mouth 2 (two) times daily.  . metoprolol (TOPROL-XL) 200 MG 24 hr tablet Take 0.5 tablets (100 mg total) by mouth daily. Take with or immediately following a meal.  . norethindrone (MICRONOR,CAMILA,ERRIN) 0.35 MG tablet Take 1 tablet (0.35 mg total) by mouth daily.  . rivaroxaban (XARELTO) 20 MG TABS tablet TAKE 1 TABLET (20 MG TOTAL) BY MOUTH DAILY WITH SUPPER.     Allergies:   Strawberry extract   Social History   Tobacco  Use  . Smoking status: Former Smoker    Last attempt to quit: 10/19/2013    Years since quitting: 5.2  . Smokeless tobacco: Never Used  Substance Use Topics  . Alcohol use: No  . Drug use: Yes    Types: Marijuana    Comment: 01/18/14     Family Hx: The patient's family history includes Asthma in her mother; Heart disease in her father.  ROS:   Please see the history of present illness.     All other systems reviewed and are negative.   Prior CV studies:   The following studies were reviewed today:  TTE 01/01/19  Labs/Other Tests and Data Reviewed:    EKG:   SR rate 56 poor R  wave progression 09/09/18  Recent Labs: 03/14/2018: TSH <0.010 09/08/2018: BUN 7; Creatinine, Ser 0.74; Hemoglobin 9.7; Platelets 253; Potassium 4.1; Sodium 141   Recent Lipid Panel No results found for: CHOL, TRIG, HDL, CHOLHDL, LDLCALC, LDLDIRECT  Wt Readings from Last 3 Encounters:  10/10/18 80.3 kg  09/19/18 80.4 kg  03/22/18 81.6 kg     Objective:    Vital Signs:  BP (!) 142/94   Pulse 74   Ht 5\' 5"  (1.651 m)   BMI 29.47 kg/m    Chronically ill female Mild goiter No distress No JVP elevation  No tachypnea No edema  ASSESSMENT & PLAN:    1. PAF: in setting of hyperthyroidism Continue metoprolol post ablative Rx now needs TSH and if normalized can stop Xarelto 2. HTN:  Poor diet and compliance with meds shuld be on Toprol and ACE at minimum given thyroid disease and DCM 3. DCM:  Likely related to post partum and thyroid disease with PAF. EF normalized by TTE 01/01/19  4. MVP  Anterior leaflet mild MR by echo 01/01/19   5. Thyroid:  Check TSH/T4 an T3 since trying to stop xarelto   COVID-19 Education: The signs and symptoms of COVID-19 were discussed with the patient and how to seek care for testing (follow up with PCP or arrange E-visit).  The importance of social distancing was discussed today.  Time:   Today, I have spent 30 minutes with the patient with telehealth technology discussing the above problems.     Medication Adjustments/Labs and Tests Ordered: Current medicines are reviewed at length with the patient today.  Concerns regarding medicines are outlined above.   Tests Ordered:  TSH, T4 T3  Medication Changes: No orders of the defined types were placed in this encounter.   Disposition:  Follow up in 6 months   Signed, Charlton Haws, MD  01/10/2019 9:33 AM    [facility]

## 2019-01-10 ENCOUNTER — Other Ambulatory Visit: Payer: Self-pay

## 2019-01-10 ENCOUNTER — Telehealth (INDEPENDENT_AMBULATORY_CARE_PROVIDER_SITE_OTHER): Payer: Medicaid Other | Admitting: Cardiovascular Disease

## 2019-01-10 ENCOUNTER — Encounter: Payer: Self-pay | Admitting: Cardiovascular Disease

## 2019-01-10 VITALS — BP 142/94 | HR 74 | Ht 65.0 in

## 2019-01-10 DIAGNOSIS — R7989 Other specified abnormal findings of blood chemistry: Secondary | ICD-10-CM | POA: Diagnosis not present

## 2019-01-10 NOTE — Patient Instructions (Addendum)
Medication Instructions:   If you need a refill on your cardiac medications before your next appointment, please call your pharmacy.   Lab work: Your physician recommends that you return for lab work tomorrow for TSH, T4, and T3  If you have labs (blood work) drawn today and your tests are completely normal, you will receive your results only by: Marland Kitchen MyChart Message (if you have MyChart) OR . A paper copy in the mail If you have any lab test that is abnormal or we need to change your treatment, we will call you to review the results.  Testing/Procedures: None ordered today.  Follow-Up: At [facility], you and your health needs are our priority.  As part of our continuing mission to provide you with exceptional heart care, we have created designated Provider Care Teams.  These Care Teams include your primary Cardiologist (physician) and Advanced Practice Providers (APPs -  Physician Assistants and Nurse Practitioners) who all work together to provide you with the care you need, when you need it. You will need a follow up appointment in 6 months.  Please call our office 2 months in advance to schedule this appointment.  You may see Dr. Eden Emms or one of the following Advanced Practice Providers on your designated Care Team:   Norma Fredrickson, NP Nada Boozer, NP . Georgie Chard, NP

## 2019-01-11 ENCOUNTER — Other Ambulatory Visit: Payer: Self-pay

## 2019-01-11 ENCOUNTER — Other Ambulatory Visit: Payer: Medicaid Other | Admitting: *Deleted

## 2019-01-11 DIAGNOSIS — R7989 Other specified abnormal findings of blood chemistry: Secondary | ICD-10-CM

## 2019-01-12 LAB — T3, FREE: T3, Free: 8.4 pg/mL — ABNORMAL HIGH (ref 2.0–4.4)

## 2019-01-12 LAB — TSH: TSH: <0.006 u[IU]/mL — ABNORMAL LOW (ref 0.450–4.500)

## 2019-01-12 LAB — T4, FREE: Free T4: 1.57 ng/dL (ref 0.82–1.77)

## 2019-03-30 ENCOUNTER — Emergency Department (HOSPITAL_COMMUNITY)
Admission: EM | Admit: 2019-03-30 | Discharge: 2019-03-30 | Disposition: A | Discharge status: Home / Self Care | Payer: Medicaid Other | Attending: Emergency Medicine | Admitting: Emergency Medicine

## 2019-03-30 ENCOUNTER — Encounter (HOSPITAL_COMMUNITY): Payer: Self-pay | Admitting: Emergency Medicine

## 2019-03-30 ENCOUNTER — Emergency Department (HOSPITAL_COMMUNITY): Payer: Medicaid Other

## 2019-03-30 DIAGNOSIS — Z87891 Personal history of nicotine dependence: Secondary | ICD-10-CM | POA: Insufficient documentation

## 2019-03-30 DIAGNOSIS — N939 Abnormal uterine and vaginal bleeding, unspecified: Secondary | ICD-10-CM | POA: Insufficient documentation

## 2019-03-30 DIAGNOSIS — R7989 Other specified abnormal findings of blood chemistry: Secondary | ICD-10-CM | POA: Diagnosis not present

## 2019-03-30 DIAGNOSIS — I110 Hypertensive heart disease with heart failure: Secondary | ICD-10-CM | POA: Insufficient documentation

## 2019-03-30 DIAGNOSIS — I5042 Chronic combined systolic (congestive) and diastolic (congestive) heart failure: Secondary | ICD-10-CM | POA: Insufficient documentation

## 2019-03-30 DIAGNOSIS — Z79899 Other long term (current) drug therapy: Secondary | ICD-10-CM | POA: Insufficient documentation

## 2019-03-30 DIAGNOSIS — R102 Pelvic and perineal pain: Secondary | ICD-10-CM | POA: Insufficient documentation

## 2019-03-30 DIAGNOSIS — E079 Disorder of thyroid, unspecified: Secondary | ICD-10-CM | POA: Insufficient documentation

## 2019-03-30 DIAGNOSIS — Z7901 Long term (current) use of anticoagulants: Secondary | ICD-10-CM | POA: Diagnosis not present

## 2019-03-30 DIAGNOSIS — I4891 Unspecified atrial fibrillation: Secondary | ICD-10-CM | POA: Diagnosis not present

## 2019-03-30 LAB — BASIC METABOLIC PANEL
Anion gap: 8 (ref 5–15)
BUN: 9 mg/dL (ref 6–20)
CO2: 22 mmol/L (ref 22–32)
Calcium: 8.6 mg/dL — ABNORMAL LOW (ref 8.9–10.3)
Chloride: 109 mmol/L (ref 98–111)
Creatinine, Ser: 0.67 mg/dL (ref 0.44–1.00)
GFR calc Af Amer: >60 mL/min (ref >60)
GFR calc non Af Amer: >60 mL/min (ref >60)
Glucose, Bld: 84 mg/dL (ref 70–99)
Potassium: 4.1 mmol/L (ref 3.5–5.1)
Sodium: 139 mmol/L (ref 135–145)

## 2019-03-30 LAB — CBC WITH DIFFERENTIAL/PLATELET
Abs Immature Granulocytes: 0.01 10*3/uL (ref 0.00–0.07)
Basophils Absolute: 0 10*3/uL (ref 0.0–0.1)
Basophils Relative: 1 %
Eosinophils Absolute: 0.1 10*3/uL (ref 0.0–0.5)
Eosinophils Relative: 3 %
HCT: 37 % (ref 36.0–46.0)
Hemoglobin: 12 g/dL (ref 12.0–15.0)
Immature Granulocytes: 0 %
Lymphocytes Relative: 43 %
Lymphs Abs: 1.6 10*3/uL (ref 0.7–4.0)
MCH: 29.7 pg (ref 26.0–34.0)
MCHC: 32.4 g/dL (ref 30.0–36.0)
MCV: 91.6 fL (ref 80.0–100.0)
Monocytes Absolute: 0.4 10*3/uL (ref 0.1–1.0)
Monocytes Relative: 9 %
Neutro Abs: 1.7 10*3/uL (ref 1.7–7.7)
Neutrophils Relative %: 44 %
Platelets: 191 10*3/uL (ref 150–400)
RBC: 4.04 MIL/uL (ref 3.87–5.11)
RDW: 13.5 % (ref 11.5–15.5)
WBC: 3.8 10*3/uL — ABNORMAL LOW (ref 4.0–10.5)
nRBC: 0 % (ref 0.0–0.2)

## 2019-03-30 LAB — I-STAT BETA HCG BLOOD, ED (MC, WL, AP ONLY): I-stat hCG, quantitative: <5 m[IU]/mL (ref <5)

## 2019-03-30 LAB — TSH: TSH: <0.01 u[IU]/mL — ABNORMAL LOW (ref 0.350–4.500)

## 2019-03-30 MED ORDER — MEGESTROL ACETATE 40 MG PO TABS: 40.0000 mg | ORAL_TABLET | Freq: Two times a day (BID) | ORAL | 2 refills | Status: AC

## 2019-03-30 MED ORDER — MEGESTROL ACETATE 40 MG PO TABS
40.0000 mg | ORAL_TABLET | Freq: Every day | ORAL | Status: DC
  Administered 2019-03-30: 18:00:00 40 mg via ORAL
  Filled 2019-03-30: qty 1

## 2019-03-30 NOTE — Discharge Instructions (Signed)
Please see the information and instructions below regarding your visit.  Your diagnoses today include:  1. Abnormal uterine bleeding   2. Low TSH level     Tests performed today include: See side panel of your discharge paperwork for testing performed today. Vital signs are listed at the bottom of these instructions.   Your hemoglobin is stable today.  Your thyroid level is still quite low.  Medications prescribed:    Take any prescribed medications only as prescribed, and any over the counter medications only as directed on the packaging.  Please start taking Megace 40 mg twice daily.  Take indefinitely until you see the OB/GYN.  This is to stop and stabilize the bleeding.  Continue Xarelto.  Home care instructions:  Please follow any educational materials contained in this packet.   Follow-up instructions: Please follow-up with OBGYN.  They will be reaching out to you to make an appointment.  Please follow-up with Dr. Hartford Poli your endocrinologist per your previously scheduled appointment.  Return instructions:  Please return to the Emergency Department if you experience worsening symptoms.  Please come back to the emergency department if you develop any suddenly worsening vaginal bleeding, dizziness, lightheadedness, chest pain or shortness of breath. Please return if you have any other emergent concerns.  Additional Information:   Your vital signs today were: BP 136/90    Pulse (!) 55    Temp 98.5 F (36.9 C) (Oral)    Resp 14    SpO2 100%  If your blood pressure (BP) was elevated on multiple readings during this visit above 130 for the top number or above 80 for the bottom number, please have this repeated by your primary care provider within one month. --------------  Thank you for allowing [place] to participate in your care today.
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
## 2019-03-30 NOTE — ED Triage Notes (Addendum)
Pt here today for abd pain and vag bleed states is on blood thinners for her afib has tubes tied states woke up in huge puddle of blood this am

## 2019-05-08 ENCOUNTER — Ambulatory Visit: Payer: Medicaid Other | Admitting: Obstetrics & Gynecology

## 2019-05-08 ENCOUNTER — Encounter: Payer: Self-pay | Admitting: Obstetrics & Gynecology

## 2019-05-14 ENCOUNTER — Encounter: Payer: Self-pay | Admitting: *Deleted

## 2019-06-21 ENCOUNTER — Ambulatory Visit (INDEPENDENT_AMBULATORY_CARE_PROVIDER_SITE_OTHER): Payer: Medicaid Other | Admitting: Obstetrics & Gynecology

## 2019-06-21 ENCOUNTER — Other Ambulatory Visit: Payer: Self-pay

## 2019-06-21 ENCOUNTER — Encounter: Payer: Self-pay | Admitting: Obstetrics & Gynecology

## 2019-06-21 VITALS — BP 155/92 | HR 61 | Temp 98.3°F | Ht 65.0 in | Wt 194.5 lb

## 2019-06-21 DIAGNOSIS — Z8742 Personal history of other diseases of the female genital tract: Secondary | ICD-10-CM | POA: Diagnosis not present

## 2019-06-21 NOTE — Progress Notes (Signed)
Patient ID: Marilyn Norman, female   DOB: [birth year], 28 y.o.   MRN: [id number]  Chief Complaint  Patient presents with  . Abnormal uterine bleeding    HPI Marilyn Norman is a 28 y.o. single P2 (52 and 76 yo kids) here today for follow up after ER visit about 2 months ago for heavy vaginal bleeding. An u/s was done and it was normal. Her hbg was 12. Her TSH was very low but she is on sythroid and is followed by Dr. Hartford Poli (saw him last week). She was given megace and took it for a couple of days but the bleeding got worse so she stopped it. Since then she has not bled, no even a period. She has been abstinent for 8 months. She is on xeralto.   Past Medical History:  Diagnosis Date  . A-fib ([place])   . Cardiomegaly   . Chest pain   . Chlamydia   . Costochondritis   . Graves' eye disease   . Hypertension   . Hyperthyroidism   . Pre-eclampsia    with each pregnancy  . Thyroid disease    hyperthyroid  . Trichomonas infection     Past Surgical History:  Procedure Laterality Date  . CESAREAN SECTION    . CESAREAN SECTION WITH BILATERAL TUBAL LIGATION N/A 01/22/2014   Procedure: REPEAT CESAREAN SECTION WITH BILATERAL TUBAL LIGATION;  Surgeon: Frederico Hamman, MD;  Location: [place] [facility];  Service: Obstetrics;  Laterality: N/A;  . IUD REMOVAL      Family History  Problem Relation Age of Onset  . Asthma Mother   . Heart disease Father     Social History Social History   Tobacco Use  . Smoking status: Former Smoker    Quit date: 10/19/2013    Years since quitting: 5.6  . Smokeless tobacco: Never Used  Substance Use Topics  . Alcohol use: No  . Drug use: Yes    Types: Marijuana    Comment: 01/18/14    Allergies  Allergen Reactions  . Strawberry Extract Anaphylaxis    Current Outpatient Medications  Medication Sig Dispense Refill  . acetaminophen (TYLENOL) 500 MG tablet Take 1,000 mg by mouth every 6 (six) hours as needed for mild pain, moderate pain, fever or headache.     Marland Kitchen amLODipine (NORVASC) 5 MG tablet Take 1 tablet (5 mg total) by mouth daily. 180 tablet 3  . ferrous sulfate 325 (65 FE) MG tablet Take 1 tablet (325 mg total) by mouth daily with supper. 30 tablet 3  . lisinopril (PRINIVIL,ZESTRIL) 20 MG tablet Take 1 tablet (20 mg total) by mouth 2 (two) times daily. 60 tablet 0  . methimazole (TAPAZOLE) 10 MG tablet Take 10 mg by mouth daily.    . metoprolol (TOPROL-XL) 200 MG 24 hr tablet Take 0.5 tablets (100 mg total) by mouth daily. Take with or immediately following a meal. 30 tablet 0  . rivaroxaban (XARELTO) 20 MG TABS tablet TAKE 1 TABLET (20 MG TOTAL) BY MOUTH DAILY WITH SUPPER. (Patient taking differently: Take 20 mg by mouth daily with supper. ) 30 tablet 6   No current facility-administered medications for this visit.     Review of Systems Review of Systems Last pap smear at [facility] last year and it was normal, always normal.  Blood pressure (!) 155/92, pulse 61, temperature 98.3 F (36.8 C), height 5\' 5"  (1.651 m), weight 194 lb 8 oz (88.2 kg).  Physical Exam Physical Exam Breathing, conversing, and ambulating  normally Well nourished, well hydrated Black female, no apparent distress Abd- benign Data Reviewed Labs, ultrasound  Assessment H/o occasion of heavy bleeding 2 months ago, no resolved  Plan She will need to follow up with her primary care doctor Ralene Ok) for her BP and f/u with endocrinology for thyroid disease.  Pap smears needed every 3 years.    Marilyn Norman 06/21/2019, 9:35 AM

## 2019-11-04 ENCOUNTER — Other Ambulatory Visit: Payer: Self-pay | Admitting: Nurse Practitioner

## 2019-11-05 NOTE — Telephone Encounter (Signed)
Pt last saw Dr Eden Emms 01/10/19 telemedicine Covid-19, last labs 03/30/19 Creat 0.67, age 29, weight 88.2kg, CrCl 172.51, based on CrCl pt is on appropriate dosage of Xarelto 20mg  QD.  Will refill rx.

## 2019-12-11 NOTE — Progress Notes (Signed)
Cardiology Office Note   Date:  12/13/2019   ID:  Marilyn Norman, DOB [birth year], MRN [id number]  PCP:  Mindi Curling, PA-C  Cardiologist: Dr. Johnsie Cancel, MD  Chief Complaint  Patient presents with  . Follow-up    History of Present Illness: Marilyn Norman is a 29 y.o. female who presents for follow up seen for Dr. Johnsie Cancel.   Ms. 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 She underwent iodine ablation for Graves' disease 08/2018 at which time her Tapazole was discontinued.  She receives her Xarelto via indigent program however continues to have trouble getting other medications. TSH previously noted to be suppressed 03/14/18 however needs f/u before making decision about stopping anticoagulation.  She underwent an echocardiogram 01/01/2019 which showed normalization of EF now at 55 to 60% with MVP and mild MR, LA dilated at 41 mm.   She was last seen by Dr. Johnsie Cancel 01/10/2019 with no complaints of palpitations.  Today she presents for follow with and is doing well from a CV standpoint. She has no c/o chest pain, palpitations, LE edema, orthopnea, dizziness or syncope. She states that she has not had a recent TSH however has follow up with her Endocrinologist today. Labs to be sent for our review. BP is a little on the high side therefore we discussed increasing her amlodipine which she agrees. Over doing well. Will have her return in about 4 months    Past Medical History:  Diagnosis Date  . A-fib ([place])   . Cardiomegaly   . Chest pain   . Chlamydia   . Costochondritis   . Graves' eye disease   . Hypertension   . Hyperthyroidism   . Pre-eclampsia    with each pregnancy  . Thyroid disease    hyperthyroid  . Trichomonas infection     Past Surgical History:  Procedure Laterality Date  . CESAREAN SECTION    . CESAREAN SECTION WITH BILATERAL TUBAL LIGATION N/A 01/22/2014   Procedure:  REPEAT CESAREAN SECTION WITH BILATERAL TUBAL LIGATION;  Surgeon: Frederico Hamman, MD;  Location: [facility];  Service: Obstetrics;  Laterality: N/A;  . IUD REMOVAL       Current Outpatient Medications  Medication Sig Dispense Refill  . acetaminophen (TYLENOL) 500 MG tablet Take 1,000 mg by mouth every 6 (six) hours as needed for mild pain, moderate pain, fever or headache.    Marland Kitchen amLODipine (NORVASC) 5 MG tablet TAKE 1 TABLET BY MOUTH EVERY DAY 30 tablet 3  . lisinopril (PRINIVIL,ZESTRIL) 20 MG tablet Take 1 tablet (20 mg total) by mouth 2 (two) times daily. 60 tablet 0  . methimazole (TAPAZOLE) 10 MG tablet Take 10 mg by mouth daily.    . metoprolol (TOPROL-XL) 200 MG 24 hr tablet Take 0.5 tablets (100 mg total) by mouth daily. Take with or immediately following a meal. 30 tablet 0  . XARELTO 20 MG TABS tablet TAKE 1 TABLET (20 MG TOTAL) BY MOUTH DAILY WITH SUPPER.*P/A* 30 tablet 6   No current facility-administered medications for this visit.    Allergies:   Strawberry extract    Social History:  The patient  reports that she quit smoking about 6 years ago. She has never used smokeless tobacco. She reports current drug use. Drug: Marijuana. She reports that she does not drink alcohol.   Family History:  The patient's family history  includes Asthma in her mother; Heart disease in her father.    ROS:  Please see the history of present illness. Otherwise, review of systems are positive for none.   All other systems are reviewed and negative.    PHYSICAL EXAM: VS:  BP (!) 140/102 (BP Location: Right Arm, Patient Position: Sitting, Cuff Size: Normal)   Pulse 73   Ht 5\' 5"  (1.651 m)   Wt 195 lb (88.5 kg)   SpO2 99%   BMI 32.45 kg/m  , BMI Body mass index is 32.45 kg/m.   General: Well developed, well nourished, NAD Neck: Negative for carotid bruits. No JVD Lungs:Clear to ausculation bilaterally. No wheezes, rales, or rhonchi. Breathing is unlabored. Cardiovascular: RRR with S1 S2.  No murmurs Extremities: No edema. Radial pulses 2+ bilaterally Neuro: Alert and oriented. No focal deficits. No facial asymmetry. MAE spontaneously. Psych: Responds to questions appropriately with normal affect.     EKG:  EKG is ordered today. The ekg ordered today demonstrates NSR with PAC, HR 73bpm.    Recent Labs: 03/30/2019: BUN 9; Creatinine, Ser 0.67; Hemoglobin 12.0; Platelets 191; Potassium 4.1; Sodium 139; TSH <0.010    Lipid Panel No results found for: CHOL, TRIG, HDL, CHOLHDL, VLDL, LDLCALC, LDLDIRECT    Wt Readings from Last 3 Encounters:  12/13/19 195 lb (88.5 kg)  06/21/19 194 lb 8 oz (88.2 kg)  10/10/18 177 lb 1.9 oz (80.3 kg)     ASSESSMENT AND PLAN:  1.  PAF: -Noted to be in the setting of hyperthyroidism -Continue metoprolol post ablative treatment>>NSR today with stable rates  -Needs follow-up TSH and if normalized can stop Xarelto>>follows with endocrinologist today for full lab work>>to be sent to our office  -Last TSH noted to be <0.010 on 03/30/2019  2.  Hypertension: -Poor diet and medication compliance -Continue current regimen however will increase amlodipine to 10mg  PO QD given BP of 140/102  3.  Dilated cardiomyopathy: -In the setting of postpartum and thyroid disease with PAF -LVEF noted to be normalized per echocardiogram 01/01/2019  4.  MVP: -Noted per most recent echocardiogram with anterior leaflet with mild MR -Follow with serial echocardiograms  5.  Hyperthyroidism: -Follow up with endocrinology today for full lab work up     Current medicines are reviewed at length with the patient today.  The patient does not have concerns regarding medicines.  The following changes have been made:  Increase amlodipine to 10mg  PO QD  Labs/ tests ordered today include: None  No orders of the defined types were placed in this encounter.    Disposition:   FU with Dr. in 4 months  Signed, 03/03/2019, NP  12/13/2019 10:13 AM    [facility]  [facility] [address] [place], [place], [address]  [place] Phone: [phone]; Fax: [phone]

## 2019-12-13 ENCOUNTER — Other Ambulatory Visit: Payer: Self-pay

## 2019-12-13 ENCOUNTER — Ambulatory Visit (INDEPENDENT_AMBULATORY_CARE_PROVIDER_SITE_OTHER): Payer: Medicaid Other | Admitting: Cardiology

## 2019-12-13 ENCOUNTER — Encounter: Payer: Self-pay | Admitting: Cardiology

## 2019-12-13 VITALS — BP 140/102 | HR 73 | Ht 65.0 in | Wt 195.0 lb

## 2019-12-13 DIAGNOSIS — O903 Peripartum cardiomyopathy: Secondary | ICD-10-CM | POA: Diagnosis not present

## 2019-12-13 DIAGNOSIS — I10 Essential (primary) hypertension: Secondary | ICD-10-CM

## 2019-12-13 DIAGNOSIS — Z79899 Other long term (current) drug therapy: Secondary | ICD-10-CM

## 2019-12-13 DIAGNOSIS — I428 Other cardiomyopathies: Secondary | ICD-10-CM

## 2019-12-13 DIAGNOSIS — Z7901 Long term (current) use of anticoagulants: Secondary | ICD-10-CM

## 2019-12-13 DIAGNOSIS — I4891 Unspecified atrial fibrillation: Secondary | ICD-10-CM

## 2019-12-13 MED ORDER — AMLODIPINE BESYLATE 10 MG PO TABS: 10.0000 mg | ORAL_TABLET | Freq: Every day | ORAL | 3 refills | Status: DC

## 2019-12-13 MED ORDER — LISINOPRIL 20 MG PO TABS: 20.0000 mg | ORAL_TABLET | Freq: Two times a day (BID) | ORAL | 3 refills | Status: DC

## 2019-12-13 MED ORDER — METOPROLOL SUCCINATE ER 200 MG PO TB24: 100.0000 mg | ORAL_TABLET | Freq: Every day | ORAL | 3 refills | Status: DC

## 2019-12-13 NOTE — Patient Instructions (Signed)
Medication Instructions:   Your physician has recommended you make the following change in your medication:   1) Increase Amlodipine to 10 mg, 1 tablet by mouth once a day  *If you need a refill on your cardiac medications before your next appointment, please call your pharmacy*  Lab Work:  None ordered today  Testing/Procedures:  None ordered today  Follow-Up: At [facility], you and your health needs are our priority.  As part of our continuing mission to provide you with exceptional heart care, we have created designated Provider Care Teams.  These Care Teams include your primary Cardiologist (physician) and Advanced Practice Providers (APPs -  Physician Assistants and Nurse Practitioners) who all work together to provide you with the care you need, when you need it.  We recommend signing up for the patient portal called "MyChart".  Sign up information is provided on this After Visit Summary.  MyChart is used to connect with patients for Virtual Visits (Telemedicine).  Patients are able to view lab/test results, encounter notes, upcoming appointments, etc.  Non-urgent messages can be sent to your provider as well.   To learn more about what you can do with MyChart, go to [email or web address].    Your next appointment:    On 04/23/20 at 9:45AM with Charlton Haws, MD

## 2020-04-09 NOTE — Progress Notes (Deleted)
Cardiology Office Note   Date:  04/09/2020   ID:  Marilyn Norman, DOB 1991-01-01, MRN [id number]  PCP:  Jordan Hawks, PA-C  Cardiologist: Dr. Eden Emms, MD  No chief complaint on file.   History of Present Illness: Marilyn Norman is a 29 y.o. female who presents for follow up seen for Dr. Eden Emms.   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 She underwent iodine ablation for Graves' disease 08/2018 at which time her Tapazole was discontinued.  She receives her Xarelto via indigent program however continues to have trouble getting other medications. TSH previously noted to be suppressed 03/14/18 however needs f/u before making decision about stopping anticoagulation.  She underwent an echocardiogram 01/01/2019 which showed normalization of EF now at 55 to 60% with MVP and mild MR, LA dilated at 41 mm.   She was last seen by Dr. Eden Emms 01/10/2019 with no complaints of palpitations.  SEes Dr Crista Curb for endocrine his note from December noted Fre T4 and T3 in normal range but TSH still suppressed .006 continue on beta blocker and Tapazole No mention of what to do with xarelto   TSH: 8.4 with Free T4 low .71 and normal T3 2.2  on 12/13/19   ***  Past Medical History:  Diagnosis Date  . A-fib ([facility])   . Cardiomegaly   . Chest pain   . Chlamydia   . Costochondritis   . Graves' eye disease   . Hypertension   . Hyperthyroidism   . Pre-eclampsia    with each pregnancy  . Thyroid disease    hyperthyroid  . Trichomonas infection     Past Surgical History:  Procedure Laterality Date  . CESAREAN SECTION    . CESAREAN SECTION WITH BILATERAL TUBAL LIGATION N/A 01/22/2014   Procedure: REPEAT CESAREAN SECTION WITH BILATERAL TUBAL LIGATION;  Surgeon: Kathreen Cosier, MD;  Location: [facility];  Service: Obstetrics;  Laterality: N/A;  . IUD REMOVAL       Current Outpatient  Medications  Medication Sig Dispense Refill  . acetaminophen (TYLENOL) 500 MG tablet Take 1,000 mg by mouth every 6 (six) hours as needed for mild pain, moderate pain, fever or headache.    Marland Kitchen amLODipine (NORVASC) 10 MG tablet Take 1 tablet (10 mg total) by mouth daily. 90 tablet 3  . lisinopril (ZESTRIL) 20 MG tablet Take 1 tablet (20 mg total) by mouth 2 (two) times daily. 180 tablet 3  . methimazole (TAPAZOLE) 10 MG tablet Take 10 mg by mouth daily.    . metoprolol (TOPROL-XL) 200 MG 24 hr tablet Take 0.5 tablets (100 mg total) by mouth daily. Take with or immediately following a meal. 45 tablet 3  . XARELTO 20 MG TABS tablet TAKE 1 TABLET (20 MG TOTAL) BY MOUTH DAILY WITH SUPPER.*P/A* 30 tablet 6   No current facility-administered medications for this visit.    Allergies:   Strawberry extract    Social History:  The patient  reports that she quit smoking about 6 years ago. She has never used smokeless tobacco. She reports current drug use. Drug: Marijuana. She reports that she does not drink alcohol.   Family History:  The patient's family history includes Asthma in her mother; Heart disease in her father.    ROS:  Please see the history of present illness. Otherwise, review of systems are positive for  none.   All other systems are reviewed and negative.    PHYSICAL EXAM: VS:  There were no vitals taken for this visit. , BMI There is no height or weight on file to calculate BMI.   Affect appropriate Healthy:  appears stated age HEENT: normal Neck supple with no adenopathy JVP normal no bruits no thyromegaly Lungs clear with no wheezing and good diaphragmatic motion Heart:  S1/S2 no murmur, no rub, gallop or click PMI normal Abdomen: benighn, BS positve, no tenderness, no AAA no bruit.  No HSM or HJR Distal pulses intact with no bruits No edema Neuro non-focal Skin warm and dry No muscular weakness      EKG:  12/13/19  NSR with PAC, HR 73bpm.    Recent Labs: No  results found for requested labs within last 8760 hours.    Lipid Panel No results found for: CHOL, TRIG, HDL, CHOLHDL, VLDL, LDLCALC, LDLDIRECT    Wt Readings from Last 3 Encounters:  12/13/19 195 lb (88.5 kg)  06/21/19 194 lb 8 oz (88.2 kg)  10/10/18 177 lb 1.9 oz (80.3 kg)     ASSESSMENT AND PLAN:  1.  PAF: -Noted to be in the setting of hyperthyroidism -Continue metoprolol post ablative treatment>>NSR today   - TSH normalized ok to stop xarelto   2.  Hypertension: -Poor diet and medication compliance -Continue current regimen   3.  Dilated cardiomyopathy: -In the setting of postpartum and thyroid disease with PAF -LVEF noted to be normalized per echocardiogram 01/01/2019  4.  MVP: -Noted per most recent echocardiogram with anterior leaflet with mild MR -Follow up echo ordered   5.  Hyperthyroidism: -Follow up with endocrinology stop xarelto when euthyroid     Current medicines are reviewed at length with the patient today.  The patient does not have concerns regarding medicines.  The following changes have been made:  Increase amlodipine to 10mg  PO QD  Labs/ tests ordered today include:    Echo for DCM in setting of PAF and hyperthyroidism    Disposition:   FU with cardiology in a year   Signed, , MD  04/09/2020 3:47 PM    [facility] [address] [place], [place], [place]  [place] Phone: [phone]; Fax: [phone]

## 2020-04-23 ENCOUNTER — Ambulatory Visit: Payer: Medicaid Other | Admitting: Cardiovascular Disease

## 2020-05-21 ENCOUNTER — Other Ambulatory Visit: Payer: Self-pay

## 2020-05-21 ENCOUNTER — Encounter (HOSPITAL_COMMUNITY): Payer: Self-pay

## 2020-05-21 ENCOUNTER — Emergency Department (HOSPITAL_COMMUNITY)
Admission: EM | Admit: 2020-05-21 | Discharge: 2020-05-21 | Disposition: A | Discharge status: Home / Self Care | Payer: Medicaid Other | Attending: Emergency Medicine | Admitting: Emergency Medicine

## 2020-05-21 DIAGNOSIS — Z23 Encounter for immunization: Secondary | ICD-10-CM | POA: Diagnosis not present

## 2020-05-21 DIAGNOSIS — E039 Hypothyroidism, unspecified: Secondary | ICD-10-CM | POA: Insufficient documentation

## 2020-05-21 DIAGNOSIS — I4891 Unspecified atrial fibrillation: Secondary | ICD-10-CM | POA: Insufficient documentation

## 2020-05-21 DIAGNOSIS — Z79899 Other long term (current) drug therapy: Secondary | ICD-10-CM | POA: Diagnosis not present

## 2020-05-21 DIAGNOSIS — Z7901 Long term (current) use of anticoagulants: Secondary | ICD-10-CM | POA: Diagnosis not present

## 2020-05-21 DIAGNOSIS — I110 Hypertensive heart disease with heart failure: Secondary | ICD-10-CM | POA: Insufficient documentation

## 2020-05-21 DIAGNOSIS — W260XXA Contact with knife, initial encounter: Secondary | ICD-10-CM | POA: Insufficient documentation

## 2020-05-21 DIAGNOSIS — I5041 Acute combined systolic (congestive) and diastolic (congestive) heart failure: Secondary | ICD-10-CM | POA: Insufficient documentation

## 2020-05-21 DIAGNOSIS — S51812A Laceration without foreign body of left forearm, initial encounter: Secondary | ICD-10-CM | POA: Diagnosis not present

## 2020-05-21 DIAGNOSIS — Z87891 Personal history of nicotine dependence: Secondary | ICD-10-CM | POA: Diagnosis not present

## 2020-05-21 MED ORDER — TETANUS-DIPHTH-ACELL PERTUSSIS 5-2.5-18.5 LF-MCG/0.5 IM SUSP
0.5000 mL | Freq: Once | INTRAMUSCULAR | Status: AC
  Administered 2020-05-21: 0.5 mL via INTRAMUSCULAR
  Filled 2020-05-21: qty 0.5

## 2020-05-21 NOTE — ED Triage Notes (Signed)
Patient here with small puncture/ lac to left forearm after tip of box cutter didn't retract and cut her arm yesterday. No bleeding. No pain

## 2020-05-21 NOTE — Discharge Instructions (Signed)
You were seen in the emergency department today after an injury to your left forearm.  The wound was cleansed and dressed.  We updated your tetanus.  Please follow attached wound care instructions.  Please follow-up with your primary care provider within 1 week if the wound is not improving.  Return to the ER for new or worsening symptoms including but not limited to uncontrollable bleeding, redness to the area, warmth to the area, pus draining from the area, increased pain, or any other concerns.
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
## 2020-08-22 ENCOUNTER — Other Ambulatory Visit: Payer: Self-pay | Admitting: Cardiovascular Disease

## 2020-08-25 NOTE — Telephone Encounter (Signed)
Pt's age 30, wt 88.5 kg, SCr 0.67, CrCl 171.53, last ov w/ JM 12/13/19.

## 2020-12-23 ENCOUNTER — Other Ambulatory Visit: Payer: Self-pay | Admitting: Cardiology

## 2021-01-17 ENCOUNTER — Other Ambulatory Visit: Payer: Self-pay | Admitting: Cardiovascular Disease

## 2021-04-26 ENCOUNTER — Other Ambulatory Visit: Payer: Self-pay | Admitting: Cardiovascular Disease

## 2021-05-22 ENCOUNTER — Other Ambulatory Visit: Payer: Self-pay | Admitting: Cardiovascular Disease

## 2021-06-05 ENCOUNTER — Other Ambulatory Visit: Payer: Self-pay | Admitting: Cardiovascular Disease

## 2021-09-05 ENCOUNTER — Emergency Department (HOSPITAL_COMMUNITY): Payer: Medicaid Other

## 2021-09-05 ENCOUNTER — Other Ambulatory Visit: Payer: Self-pay

## 2021-09-05 ENCOUNTER — Encounter (HOSPITAL_COMMUNITY): Payer: Self-pay | Admitting: Emergency Medicine

## 2021-09-05 ENCOUNTER — Emergency Department (HOSPITAL_COMMUNITY)
Admission: EM | Admit: 2021-09-05 | Discharge: 2021-09-05 | Disposition: A | Discharge status: Home / Self Care | Payer: Medicaid Other | Attending: Student | Admitting: Student

## 2021-09-05 DIAGNOSIS — L03211 Cellulitis of face: Secondary | ICD-10-CM | POA: Diagnosis not present

## 2021-09-05 DIAGNOSIS — I4891 Unspecified atrial fibrillation: Secondary | ICD-10-CM | POA: Insufficient documentation

## 2021-09-05 DIAGNOSIS — I110 Hypertensive heart disease with heart failure: Secondary | ICD-10-CM | POA: Insufficient documentation

## 2021-09-05 DIAGNOSIS — F17210 Nicotine dependence, cigarettes, uncomplicated: Secondary | ICD-10-CM | POA: Insufficient documentation

## 2021-09-05 DIAGNOSIS — K029 Dental caries, unspecified: Secondary | ICD-10-CM | POA: Insufficient documentation

## 2021-09-05 DIAGNOSIS — Z79899 Other long term (current) drug therapy: Secondary | ICD-10-CM | POA: Diagnosis not present

## 2021-09-05 DIAGNOSIS — I509 Heart failure, unspecified: Secondary | ICD-10-CM | POA: Insufficient documentation

## 2021-09-05 DIAGNOSIS — R220 Localized swelling, mass and lump, head: Secondary | ICD-10-CM | POA: Diagnosis present

## 2021-09-05 LAB — CBC WITH DIFFERENTIAL/PLATELET
Abs Immature Granulocytes: 0.03 10*3/uL (ref 0.00–0.07)
Basophils Absolute: 0 10*3/uL (ref 0.0–0.1)
Basophils Relative: 0 %
Eosinophils Absolute: 0 10*3/uL (ref 0.0–0.5)
Eosinophils Relative: 0 %
HCT: 31.3 % — ABNORMAL LOW (ref 36.0–46.0)
Hemoglobin: 8.9 g/dL — ABNORMAL LOW (ref 12.0–15.0)
Immature Granulocytes: 0 %
Lymphocytes Relative: 15 %
Lymphs Abs: 1.5 10*3/uL (ref 0.7–4.0)
MCH: 18.9 pg — ABNORMAL LOW (ref 26.0–34.0)
MCHC: 28.4 g/dL — ABNORMAL LOW (ref 30.0–36.0)
MCV: 66.6 fL — ABNORMAL LOW (ref 80.0–100.0)
Monocytes Absolute: 0.7 10*3/uL (ref 0.1–1.0)
Monocytes Relative: 7 %
Neutro Abs: 7.9 10*3/uL — ABNORMAL HIGH (ref 1.7–7.7)
Neutrophils Relative %: 78 %
Platelets: 250 10*3/uL (ref 150–400)
RBC: 4.7 MIL/uL (ref 3.87–5.11)
RDW: 20.7 % — ABNORMAL HIGH (ref 11.5–15.5)
WBC: 10.2 10*3/uL (ref 4.0–10.5)
nRBC: 0 % (ref 0.0–0.2)

## 2021-09-05 LAB — BASIC METABOLIC PANEL
Anion gap: 9 (ref 5–15)
BUN: 7 mg/dL (ref 6–20)
CO2: 20 mmol/L — ABNORMAL LOW (ref 22–32)
Calcium: 9 mg/dL (ref 8.9–10.3)
Chloride: 107 mmol/L (ref 98–111)
Creatinine, Ser: 0.72 mg/dL (ref 0.44–1.00)
GFR, Estimated: >60 mL/min (ref >60)
Glucose, Bld: 106 mg/dL — ABNORMAL HIGH (ref 70–99)
Potassium: 4 mmol/L (ref 3.5–5.1)
Sodium: 136 mmol/L (ref 135–145)

## 2021-09-05 MED ORDER — CEFADROXIL 500 MG PO CAPS: 500.0000 mg | ORAL_CAPSULE | Freq: Two times a day (BID) | ORAL | 0 refills | Status: AC

## 2021-09-05 MED ORDER — CEPHALEXIN 250 MG PO CAPS: 500.0000 mg | ORAL_CAPSULE | Freq: Once | ORAL | Status: DC

## 2021-09-05 MED ORDER — IOHEXOL 300 MG/ML  SOLN
100.0000 mL | Freq: Once | INTRAMUSCULAR | Status: AC | PRN
  Administered 2021-09-05 (×2): 100 mL via INTRAVENOUS

## 2021-09-05 MED ORDER — CEFADROXIL 500 MG PO CAPS
500.0000 mg | ORAL_CAPSULE | Freq: Two times a day (BID) | ORAL | Status: DC
  Administered 2021-09-05: 500 mg via ORAL
  Filled 2021-09-05 (×2): qty 1

## 2021-09-05 MED ORDER — ACETAMINOPHEN 325 MG PO TABS
650.0000 mg | ORAL_TABLET | Freq: Once | ORAL | Status: AC
  Administered 2021-09-05: 650 mg via ORAL
  Filled 2021-09-05: qty 2

## 2021-09-05 NOTE — ED Triage Notes (Signed)
Large L lower dental abscess that started yesterday and significantly worse this morning.  Denies fever and chills.
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
## 2021-09-05 NOTE — ED Provider Triage Note (Signed)
Emergency Medicine Provider Triage Evaluation Note  Marilyn Norman , a 30 y.o. female  was evaluated in triage.  Pt complains of left lower dental pain and swelling.  Symptoms began 24 hours ago but progressed when she woke up this morning.  Reports significant pain, swelling, trouble swallowing.  She has tried taking Tylenol with only minimal improvement in her symptoms.  She is currently on Xarelto for A. fib so she cannot take NSAIDs.  She last saw a dentist within the past 6 months and appears that she had a cavity filled in this area?  Did not take any Tylenol this morning.  Denies any shortness of breath.  Review of Systems  Positive: Dental pain and swelling Negative: Shortness of breath  Physical Exam  BP (!) 134/92    Pulse (!) 125    Temp 100.1 F (37.8 C) (Oral)    Resp 18    SpO2 100%  Gen:   Awake, no distress   Resp:  Normal effort  MSK:   Moves extremities without difficulty  Other:  Significant edema along the left lower jawline with tenderness.  Normal phonation.  There is trismus but no drooling.  Medical Decision Making  Medically screening exam initiated at 8:44 AM.  Appropriate orders placed.  [name] was informed that the remainder of the evaluation will be completed by another provider, this initial triage assessment does not replace that evaluation, and the importance of remaining in the ED until their evaluation is complete.  Patient with temperature of 100.1 and tachycardic to 125.  Will give a dose of Tylenol but will need to check CT soft tissue of the neck to evaluate for deeper infection due to location.   Dietrich Pates, PA-C 09/05/21 0845

## 2021-11-27 ENCOUNTER — Telehealth: Payer: Self-pay | Admitting: Cardiovascular Disease

## 2021-11-27 DIAGNOSIS — I4891 Unspecified atrial fibrillation: Secondary | ICD-10-CM

## 2021-11-27 MED ORDER — RIVAROXABAN 20 MG PO TABS: 20.0000 mg | ORAL_TABLET | Freq: Every day | ORAL | 0 refills | Status: DC

## 2021-11-27 NOTE — Telephone Encounter (Signed)
? ? ?*  STAT* If patient is at the pharmacy, call can be transferred to refill team. ? ? ?1. Which medications need to be refilled? (please list name of each medication and dose if known) XARELTO 20 MG TABS tablet ? ?2. Which pharmacy/location (including street and city if local pharmacy) is medication to be sent to? [id number] - [place], [place] - [address] ? ?3. Do they need a 30 day or 90 day supply? 90 days ?

## 2021-11-27 NOTE — Telephone Encounter (Addendum)
Prescription refill request for Xarelto received.  ?Indication: Afib  ?Last office visit: 12/13/19 Julien Girt)  ?Weight: 88.5kg ?Age: 31 ?Scr: 0.72 (1/14/230 ?CrCl:  156.28ml/min ? ?Pt overdue for office visit; scheduled appt on 01/08/22 with [facility]. Appropriate dose and refill sent to requested pharmacy.  ? ? ? ? ?

## 2022-01-07 NOTE — Progress Notes (Signed)
Cardiology Office Note:    Date:  01/08/2022   ID:  Marilyn Norman, DOB 1991-01-01, MRN [id number]  PCP:  Juanda Chance   [facility] Providers Cardiologist:  Jenkins Rouge, MD     Referring MD: Mindi Curling, PA-C   Chief Complaint: overdue follow-up PAF, cardiomyopathy  History of Present Illness:    Marilyn Norman is a very pleasant 31 y.o. 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 She underwent iodine ablation for Graves disease 08/2018 at which time her Tapazole was discontinued. Receives Xarelto via [employer] however continues to have trouble getting other medications. Normalization of EF on echo 12/2018 with EF 55-60% with MVP and mild MR, LA dilated at 41 mm.  Last seen in our office 12/13/19 by Kathyrn Drown, NP. She was due to have follow-up labs for thyroid at that time. Was recommended not to stop Xarelto until normalization of thyroid  was verified.  Today, she is here alone for follow-up.  She has run out of her cardiac medications.  Needs to see her endocrinologist, Dr. Hartford Poli for thyroid testing.  States it has been a while since she has seen him.  She continues to take Xarelto.  She denies bleeding problems. No return of PAF that she is aware of. No chest pain, dyspnea, palpitations, orthopnea, PND, edema, presyncope, syncope, melena. 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  Past Medical History:  Diagnosis Date   A-fib [facility])    Cardiomegaly    Chest pain    Chlamydia    Costochondritis    Graves' eye disease    Hypertension    Hyperthyroidism    Pre-eclampsia    with each pregnancy   Thyroid disease    hyperthyroid   Trichomonas infection     Past Surgical History:  Procedure Laterality Date    CESAREAN SECTION     CESAREAN SECTION WITH BILATERAL TUBAL LIGATION N/A 01/22/2014   Procedure: REPEAT CESAREAN SECTION WITH BILATERAL TUBAL LIGATION;  Surgeon: Frederico Hamman, MD;  Location: [facility];  Service: Obstetrics;  Laterality: N/A;   IUD REMOVAL      Current Medications: Current Meds  Medication Sig   acetaminophen (TYLENOL) 500 MG tablet Take 1,000 mg by mouth every 6 (six) hours as needed for mild pain, moderate pain, fever or headache.   methimazole (TAPAZOLE) 10 MG tablet Take 5 mg by mouth daily.   rivaroxaban (XARELTO) 20 MG TABS tablet Take 1 tablet (20 mg total) by mouth daily with supper.   [DISCONTINUED] lisinopril (ZESTRIL) 20 MG tablet TAKE 1 TABLET 2 (TWO) TIMES DAILY. PLEASE MAKE OVERDUE APPT WITH DR. Johnsie Cancel BEFORE ANYMORE REFILLS   [DISCONTINUED] metoprolol (TOPROL-XL) 200 MG 24 hr tablet Take 0.5 tablets (100 mg total) by mouth daily. Take with or immediately following a meal.     Allergies:   Strawberry extract   Social History   Socioeconomic History   Marital status: Single    Spouse name: Not on file   Number of children: Not on file   Years of education: Not on file   Highest education level: Not on file  Occupational History   Not on file  Tobacco Use   Smoking status: Former    Types: Cigarettes    Quit  date: 10/19/2013    Years since quitting: 8.2   Smokeless tobacco: Never  Vaping Use   Vaping Use: Never used  Substance and Sexual Activity   Alcohol use: No   Drug use: Yes    Types: Marijuana    Comment: 01/18/14   Sexual activity: Not Currently    Birth control/protection: None  Other Topics Concern   Not on file  Social History Narrative   Not on file   Social Determinants of Health   Financial Resource Strain: Not on file  Food Insecurity: Not on file  Transportation Needs: Not on file  Physical Activity: Not on file  Stress: Not on file  Social Connections: Not on file     Family History: The patient's family history  includes Asthma in her mother; Heart disease in her father.  ROS:   Please see the history of present illness.   All other systems reviewed and are negative.  Labs/Other Studies Reviewed:    The following studies were reviewed today:    Recent Labs: 09/05/2021: BUN 7; Creatinine, Ser 0.72; Hemoglobin 8.9; Platelets 250; Potassium 4.0; Sodium 136  Recent Lipid Panel No results found for: CHOL, TRIG, HDL, CHOLHDL, VLDL, LDLCALC, LDLDIRECT   Risk Assessment/Calculations:    CHA2DS2-VASc Score = 3  This indicates a 3.2% annual risk of stroke. The patient's score is based upon: CHF History: 1 HTN History: 1 Diabetes History: 0 Stroke History: 0 Vascular Disease History: 0 Age Score: 0 Gender Score: 1    Physical Exam:    VS:  BP 134/86   Pulse 78   Ht 5\' 5"  (1.651 m)   Wt 170 lb (77.1 kg)   SpO2 99%   BMI 28.29 kg/m     Wt Readings from Last 3 Encounters:  01/08/22 170 lb (77.1 kg)  12/13/19 195 lb (88.5 kg)  06/21/19 194 lb 8 oz (88.2 kg)     GEN:  Well nourished, well developed in no acute distress HEENT: Normal NECK: No JVD; No carotid bruits CARDIAC: RRR, no murmurs, rubs, gallops RESPIRATORY:  Clear to auscultation without rales, wheezing or rhonchi  ABDOMEN: Soft, non-tender, non-distended MUSCULOSKELETAL:  No edema; No deformity. 2+ pedal pulses, equal bilaterally SKIN: Warm and dry NEUROLOGIC:  Alert and oriented x 3 PSYCHIATRIC:  Normal affect   EKG:  EKG is ordered today.  The ekg ordered today demonstrates NSR at 65 bpm, no ST/T wave abnormality  Diagnoses:    1. PAF (paroxysmal atrial fibrillation) ([place])   2. Chronic anticoagulation   3. NICM (nonischemic cardiomyopathy) ([place])   4. Hyperthyroidism   5. Medication management    Assessment and Plan:     NICM: Normalization of EF to 55-60% by echo 01/01/2019.  She denies dyspnea, orthopnea, edema, or PND.  No indication to repeat echocardiogram at this time.  Continue lisinopril, metoprolol.    PAF on chronic anticoagulation: She denies bleeding problems on Xarelto, however her CBC 09/05/21 revealed anemia and she was advised to follow-up with PCP.  States she has had trouble getting an appointment.  We will recheck CBC today and forward labs to PCP. Has CHADsVASc score of 3.  It was felt in the past that she could should continue DOAC until verification that thyroid function is normal.  We will continue Xarelto at this time.  Medication management: Discussed the importance of staying on her medications and appropriate follow-up.  We attempted to reach out to PCP to assist her with making an appointment and were  unable to get an answer.  I will forward lab results to Junie Spencer, PA for follow-up.  Hyperthyroidism: She reports she has an appointment with her endocrinologist next month.  I have emphasized the importance of appropriate management of thyroid function in the setting of PAF, particularly if blood counts remain low and there is a question of holding Xarelto.  Disposition:   6 months with Dr. Johnsie Cancel    Medication Adjustments/Labs and Tests Ordered: Current medicines are reviewed at length with the patient today.  Concerns regarding medicines are outlined above.  Orders Placed This Encounter  Procedures   CBC   EKG 12-Lead   Meds ordered this encounter  Medications   lisinopril (ZESTRIL) 20 MG tablet    Sig: TAKE 1 TABLET 2 (TWO) TIMES DAILY.    Dispense:  90 tablet    Refill:  3    Take 1 tablet (20 mg total) by mouth 2 (two) times daily.   amLODipine (NORVASC) 10 MG tablet    Sig: Take 1 tablet (10 mg total) by mouth daily.    Dispense:  90 tablet    Refill:  3   metoprolol (TOPROL-XL) 200 MG 24 hr tablet    Sig: Take 0.5 tablets (100 mg total) by mouth daily. Take with or immediately following a meal.    Dispense:  45 tablet    Refill:  3    Patient Instructions  Medication Instructions:   Your physician recommends that you continue on your current  medications as directed. Please refer to the Current Medication list given to you today.   *If you need a refill on your cardiac medications before your next appointment, please call your pharmacy*   Lab Work:  TODAY!!!! CBC  If you have labs (blood work) drawn today and your tests are completely normal, you will receive your results only by: [place] (if you have MyChart) OR A paper copy in the mail If you have any lab test that is abnormal or we need to change your treatment, we will call you to review the results.   Testing/Procedures:  None ordered.    Follow-Up: At [facility], you and your health needs are our priority.  As part of our continuing mission to provide you with exceptional heart care, we have created designated Provider Care Teams.  These Care Teams include your primary Cardiologist (physician) and Advanced Practice Providers (APPs -  Physician Assistants and Nurse Practitioners) who all work together to provide you with the care you need, when you need it.  We recommend signing up for the patient portal called "MyChart".  Sign up information is provided on this After Visit Summary.  MyChart is used to connect with patients for Virtual Visits (Telemedicine).  Patients are able to view lab/test results, encounter notes, upcoming appointments, etc.  Non-urgent messages can be sent to your provider as well.   To learn more about what you can do with MyChart, go to [email or web address].    Your next appointment:   6 month(s)  The format for your next appointment:   In Person  Provider:   Jenkins Rouge, MD    Important Information About Sugar         Signed, Emmaline Life, NP  01/08/2022 12:37 PM    [place]

## 2022-01-08 ENCOUNTER — Telehealth: Payer: Self-pay | Admitting: *Deleted

## 2022-01-08 ENCOUNTER — Encounter: Payer: Self-pay | Admitting: Nurse Practitioner

## 2022-01-08 ENCOUNTER — Ambulatory Visit (INDEPENDENT_AMBULATORY_CARE_PROVIDER_SITE_OTHER): Payer: Medicaid Other | Admitting: Nurse Practitioner

## 2022-01-08 VITALS — BP 134/86 | HR 78 | Ht 65.0 in | Wt 170.0 lb

## 2022-01-08 DIAGNOSIS — I480 Paroxysmal atrial fibrillation: Secondary | ICD-10-CM

## 2022-01-08 DIAGNOSIS — E0590 Thyrotoxicosis, unspecified without thyrotoxic crisis or storm: Secondary | ICD-10-CM | POA: Diagnosis not present

## 2022-01-08 DIAGNOSIS — I428 Other cardiomyopathies: Secondary | ICD-10-CM | POA: Diagnosis not present

## 2022-01-08 DIAGNOSIS — Z7901 Long term (current) use of anticoagulants: Secondary | ICD-10-CM | POA: Diagnosis not present

## 2022-01-08 DIAGNOSIS — Z79899 Other long term (current) drug therapy: Secondary | ICD-10-CM

## 2022-01-08 LAB — CBC
Hematocrit: 26.6 % — ABNORMAL LOW (ref 34.0–46.6)
Hemoglobin: 8 g/dL — ABNORMAL LOW (ref 11.1–15.9)
MCH: 20 pg — ABNORMAL LOW (ref 26.6–33.0)
MCHC: 30.1 g/dL — ABNORMAL LOW (ref 31.5–35.7)
MCV: 67 fL — ABNORMAL LOW (ref 79–97)
Platelets: 283 10*3/uL (ref 150–450)
RBC: 4 x10E6/uL (ref 3.77–5.28)
RDW: 20.2 % — ABNORMAL HIGH (ref 11.7–15.4)
WBC: 4.9 10*3/uL (ref 3.4–10.8)

## 2022-01-08 MED ORDER — AMLODIPINE BESYLATE 10 MG PO TABS: 10.0000 mg | ORAL_TABLET | Freq: Every day | ORAL | 3 refills | Status: DC

## 2022-01-08 MED ORDER — LISINOPRIL 20 MG PO TABS: ORAL_TABLET | ORAL | 3 refills | Status: DC

## 2022-01-08 MED ORDER — METOPROLOL SUCCINATE ER 200 MG PO TB24: 100.0000 mg | ORAL_TABLET | Freq: Every day | ORAL | 3 refills | Status: DC

## 2022-01-08 NOTE — Patient Instructions (Signed)
Medication Instructions:   Your physician recommends that you continue on your current medications as directed. Please refer to the Current Medication list given to you today.   *If you need a refill on your cardiac medications before your next appointment, please call your pharmacy*   Lab Work:  TODAY!!!! CBC  If you have labs (blood work) drawn today and your tests are completely normal, you will receive your results only by: MyChart Message (if you have MyChart) OR A paper copy in the mail If you have any lab test that is abnormal or we need to change your treatment, we will call you to review the results.   Testing/Procedures:  None ordered.    Follow-Up: At [facility], you and your health needs are our priority.  As part of our continuing mission to provide you with exceptional heart care, we have created designated Provider Care Teams.  These Care Teams include your primary Cardiologist (physician) and Advanced Practice Providers (APPs -  Physician Assistants and Nurse Practitioners) who all work together to provide you with the care you need, when you need it.  We recommend signing up for the patient portal called "MyChart".  Sign up information is provided on this After Visit Summary.  MyChart is used to connect with patients for Virtual Visits (Telemedicine).  Patients are able to view lab/test results, encounter notes, upcoming appointments, etc.  Non-urgent messages can be sent to your provider as well.   To learn more about what you can do with MyChart, go to [email or web address].    Your next appointment:   6 month(s)  The format for your next appointment:   In Person  Provider:   Charlton Haws, MD    Important Information About Sugar

## 2022-01-08 NOTE — Telephone Encounter (Signed)
Pt needs appointment with PCP.  Tried to call office at [phone] for Ralene Ok, PA-C.  Was hold for over 12 minutes.  Marcelino Duster stated to hang up and will send PCP the results of pt's lab work and let them know pt has been trying to reach office to no avail.

## 2022-01-11 ENCOUNTER — Other Ambulatory Visit: Payer: Self-pay | Admitting: Nurse Practitioner

## 2022-01-11 DIAGNOSIS — Z79899 Other long term (current) drug therapy: Secondary | ICD-10-CM

## 2022-01-11 DIAGNOSIS — I480 Paroxysmal atrial fibrillation: Secondary | ICD-10-CM

## 2022-01-11 DIAGNOSIS — Z7901 Long term (current) use of anticoagulants: Secondary | ICD-10-CM

## 2022-01-11 DIAGNOSIS — E0590 Thyrotoxicosis, unspecified without thyrotoxic crisis or storm: Secondary | ICD-10-CM

## 2022-01-14 ENCOUNTER — Other Ambulatory Visit: Payer: Medicaid Other

## 2022-01-14 DIAGNOSIS — E0590 Thyrotoxicosis, unspecified without thyrotoxic crisis or storm: Secondary | ICD-10-CM

## 2022-01-14 DIAGNOSIS — I480 Paroxysmal atrial fibrillation: Secondary | ICD-10-CM

## 2022-01-14 DIAGNOSIS — Z7901 Long term (current) use of anticoagulants: Secondary | ICD-10-CM

## 2022-01-14 DIAGNOSIS — Z79899 Other long term (current) drug therapy: Secondary | ICD-10-CM

## 2022-01-15 LAB — THYROID PANEL
Free Thyroxine Index: 1.4 (ref 1.2–4.9)
T3 Uptake Ratio: 23 % — ABNORMAL LOW (ref 24–39)
T4, Total: 6 ug/dL (ref 4.5–12.0)

## 2022-01-15 LAB — IRON AND TIBC
Iron Saturation: 3 % — CL (ref 15–55)
Iron: 10 ug/dL — ABNORMAL LOW (ref 27–159)
Total Iron Binding Capacity: 386 ug/dL (ref 250–450)
UIBC: 376 ug/dL (ref 131–425)

## 2022-01-15 NOTE — Telephone Encounter (Signed)
S/w pt stated did not need a new lab appointment due to the labs drawn last week.  Stated Iron low and needs treatment needs to speak with PCP whom pt has been trying to get in with and also this office tried to get pt in with PCP. IF pt cannot get in with PCP will have to refer pt to hematology and Marcelino Duster will dose iron supplement. Will call pt back with proper dosage of iron supplement per Marcelino Duster.

## 2022-01-21 ENCOUNTER — Telehealth: Payer: Self-pay | Admitting: Hematology

## 2022-01-21 NOTE — Telephone Encounter (Signed)
Scheduled appt per 5/31 referral. Pt is aware of appt date and time. Pt is aware to arrive 15 mins prior to appt time and to bring and updated insurance card. Pt is aware of appt location.   

## 2022-02-05 ENCOUNTER — Other Ambulatory Visit: Payer: Self-pay

## 2022-02-05 ENCOUNTER — Encounter: Payer: Self-pay | Admitting: Hematology

## 2022-02-05 ENCOUNTER — Inpatient Hospital Stay: Payer: Medicaid Other | Attending: Hematology | Admitting: Hematology

## 2022-02-05 VITALS — BP 142/79 | HR 71 | Temp 98.3°F | Resp 18 | Ht 65.0 in | Wt 180.7 lb

## 2022-02-05 DIAGNOSIS — N920 Excessive and frequent menstruation with regular cycle: Secondary | ICD-10-CM | POA: Insufficient documentation

## 2022-02-05 DIAGNOSIS — F17210 Nicotine dependence, cigarettes, uncomplicated: Secondary | ICD-10-CM | POA: Insufficient documentation

## 2022-02-05 DIAGNOSIS — D500 Iron deficiency anemia secondary to blood loss (chronic): Secondary | ICD-10-CM | POA: Insufficient documentation

## 2022-02-05 DIAGNOSIS — I10 Essential (primary) hypertension: Secondary | ICD-10-CM | POA: Diagnosis not present

## 2022-02-05 DIAGNOSIS — I4891 Unspecified atrial fibrillation: Secondary | ICD-10-CM | POA: Diagnosis not present

## 2022-02-05 DIAGNOSIS — E0500 Thyrotoxicosis with diffuse goiter without thyrotoxic crisis or storm: Secondary | ICD-10-CM | POA: Insufficient documentation

## 2022-02-05 DIAGNOSIS — Z7901 Long term (current) use of anticoagulants: Secondary | ICD-10-CM | POA: Diagnosis not present

## 2022-02-05 NOTE — Progress Notes (Signed)
[facility]   Telephone:(336) [phone] Fax:(336) [phone]   Clinic New consult Note   Patient Care Team: Barbarann Ehlers as PCP - General (Physician Assistant) Wendall Stade, MD as PCP - Cardiology (Cardiology) Barbarann Ehlers (Physician Assistant) 02/05/2022  CHIEF COMPLAINTS/PURPOSE OF CONSULTATION:  Iron Deficient Anemia  ASSESSMENT & PLAN:  Marilyn Norman is a 31 y.o. pre-menopausal female with a history of Graves disease, hyperthyroidism  1. 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 She has had three children, did not experience any abnormal bleeding or required iron or blood transfusion during her pregnancy.. -hgb was found to be low at 8.9 in ED on 09/05/21. Labs with her cardiologist on 01/08/22 showed decrease to 8. -iron panel 01/14/22 showed serum iron 10 and saturation 3%.,  Consistent with iron deficiency.  -Her new iron deficient anemia is probably related to her Xarelto, which she has been on for 4 to 5 years for A-fib. --Xarelto was discontinued 2-3 weeks ago due to new anemia. She was placed on oral iron on 01/19/22 by her PCP. -she is asymptomatic and denies any other noticeable bleeding. -she has never had colonoscopy. I recommend obtaining stool samples to rule out GI bleed; she is agreeable. -I also discussed the need for IV iron if her levels remain low.  Since she is asymptomatic from her anemia, and tolerating oral iron well, will hold on IV iron for now. -we will repeat her labs in 2 and 6 weeks.  2. A.fib, history of cardiomyopathy -diagnosed 5 years ago (~2018) -started on Xarelto, did not notice any change in her periods  3. Smoking Cessation -she currently smokes 3-4 cigarettes a day -I reviewed the risk of lung cancer with her today and encouraged her to quit.   PLAN: -complete stool OB test soon -lab in 2  and 6 weeks -If her anemia and iron level does not improve significantly in 2 weeks, may consider IV iron. -lab and f/u in 3 months   HISTORY OF PRESENTING ILLNESS:  Marilyn Norman 31 y.o. female is here because of iron deficient anemia.  She was found to have abnormal CBC from 09/05/21, hgb 8.9, and repeat 01/08/22, hgb 8. She denies recent chest pain on exertion, shortness of breath on minimal exertion, pre-syncopal episodes, or palpitations. She has a history of heavy periods since she was a teenager. These last 5 days, and she has to change her pad/tampon every 30-60 minutes. Aside from periods, she had not noticed any recent bleeding such as epistaxis, hematuria or hematochezia. She has never had colonoscopy (age). The patient was prescribed oral ferrous gluconate on 01/19/22.  She was diagnosed with A.fib about 5 years ago and was started on Xarelto around that time. Xarelto was recently discontinued due to anemia.  G5P3, no abnormal bleeding with births She is a current smoker, 3-4 cigarettes per day   REVIEW OF SYSTEMS:   Constitutional: Denies fevers, chills or abnormal night sweats Eyes: Denies blurriness of vision, double vision or watery eyes Ears, nose, mouth, throat, and face: Denies mucositis or sore throat Respiratory: Denies cough, dyspnea or wheezes Cardiovascular: Denies palpitation, chest discomfort or lower extremity swelling Gastrointestinal:  Denies nausea, heartburn or change in bowel habits Skin: Denies abnormal skin rashes Lymphatics: Denies new lymphadenopathy or easy bruising Neurological:Denies numbness, tingling or new weaknesses Behavioral/Psych: Mood is stable,  no new changes   All other systems were reviewed with the patient and are negative.   MEDICAL HISTORY:  Past Medical History:  Diagnosis Date   A-fib [facility])    Cardiomegaly    Chest pain    Chlamydia    Costochondritis    Graves' eye disease    Hypertension    Hyperthyroidism     Pre-eclampsia    with each pregnancy   Thyroid disease    hyperthyroid   Trichomonas infection     SURGICAL HISTORY: Past Surgical History:  Procedure Laterality Date   CESAREAN SECTION     CESAREAN SECTION WITH BILATERAL TUBAL LIGATION N/A 01/22/2014   Procedure: REPEAT CESAREAN SECTION WITH BILATERAL TUBAL LIGATION;  Surgeon: Kathreen Cosier, MD;  Location: [facility];  Service: Obstetrics;  Laterality: N/A;   IUD REMOVAL     TUBAL LIGATION      SOCIAL HISTORY: Social History   Socioeconomic History   Marital status: Single    Spouse name: Not on file   Number of children: 3   Years of education: Not on file   Highest education level: Not on file  Occupational History   Not on file  Tobacco Use   Smoking status: Every Day    Packs/day: 0.25    Years: 15.00    Total pack years: 3.75    Types: Cigarettes    Last attempt to quit: 10/19/2013    Years since quitting: 8.3   Smokeless tobacco: Never  Vaping Use   Vaping Use: Never used  Substance and Sexual Activity   Alcohol use: No   Drug use: Yes    Types: Marijuana    Comment: 01/18/14   Sexual activity: Yes    Birth control/protection: None  Other Topics Concern   Not on file  Social History Narrative   Not on file   Social Determinants of Health   Financial Resource Strain: Not on file  Food Insecurity: No Food Insecurity (09/19/2018)   Hunger Vital Sign    Worried About Running Out of Food in the Last Year: Never true    Ran Out of Food in the Last Year: Never true  Transportation Needs: No Transportation Needs (09/19/2018)   PRAPARE - Administrator, Civil Service (Medical): No    Lack of Transportation (Non-Medical): No  Physical Activity: Not on file  Stress: Not on file  Social Connections: Not on file  Intimate Partner Violence: Not on file    FAMILY HISTORY: Family History  Problem Relation Age of Onset   Asthma Mother    Heart disease Father     ALLERGIES:  is allergic to  strawberry extract.  MEDICATIONS:  Current Outpatient Medications  Medication Sig Dispense Refill   acetaminophen (TYLENOL) 500 MG tablet Take 1,000 mg by mouth every 6 (six) hours as needed for mild pain, moderate pain, fever or headache.     amLODipine (NORVASC) 10 MG tablet Take 1 tablet (10 mg total) by mouth daily. 90 tablet 3   lisinopril (ZESTRIL) 20 MG tablet TAKE 1 TABLET 2 (TWO) TIMES DAILY. 90 tablet 3   methimazole (TAPAZOLE) 10 MG tablet Take 5 mg by mouth daily.     metoprolol (TOPROL-XL) 200 MG 24 hr tablet Take 0.5 tablets (100 mg total) by mouth daily. Take with or immediately following a meal. 45 tablet 3   No current facility-administered medications for this visit.    PHYSICAL EXAMINATION: ECOG PERFORMANCE STATUS: 0 - Asymptomatic  Vitals:   02/05/22 1534  BP: (!) 142/79  Pulse: 71  Resp: 18  Temp: 98.3 F (36.8 C)  SpO2: 100%   Filed Weights   02/05/22 1534  Weight: 180 lb 11.2 oz (82 kg)    GENERAL:alert, no distress and comfortable SKIN: skin color, texture, turgor are normal, no rashes or significant lesions EYES: normal, conjunctiva are pink and non-injected, sclera clear OROPHARYNX:no exudate, no erythema and lips, buccal mucosa, and tongue normal  NECK: supple, thyroid normal size, non-tender, without nodularity LYMPH:  no palpable lymphadenopathy in the cervical, axillary or inguinal LUNGS: clear to auscultation and percussion with normal breathing effort HEART: regular rate & rhythm and no murmurs and no lower extremity edema ABDOMEN:abdomen soft, non-tender and normal bowel sounds Musculoskeletal:no cyanosis of digits and no clubbing  PSYCH: alert & oriented x 3 with fluent speech NEURO: no focal motor/sensory deficits  LABORATORY DATA:  I have reviewed the data as listed    Latest Ref Rng & Units 01/08/2022   11:57 AM 09/05/2021    8:15 AM 03/30/2019    2:25 PM  CBC  WBC 3.4 - 10.8 x10E3/uL 4.9  10.2  3.8   Hemoglobin 11.1 - 15.9 g/dL  8.0  8.9  54.6   Hematocrit 34.0 - 46.6 % 26.6  31.3  37.0   Platelets 150 - 450 x10E3/uL 283  250  191        Latest Ref Rng & Units 09/05/2021    8:15 AM 03/30/2019    2:25 PM 09/08/2018   10:43 AM  CMP  Glucose 70 - 99 mg/dL 270  84  91   BUN 6 - 20 mg/dL 7  9  7    Creatinine 0.44 - 1.00 mg/dL  3.50  0.93   Sodium 135 - 145 mmol/L 136  139  141   Potassium 3.5 - 5.1 mmol/L 4.0  4.1  4.1   Chloride 98 - 111 mmol/L 107  109  110   CO2 22 - 32 mmol/L 20  22  23    Calcium 8.9 - 10.3 mg/dL 9.0  8.6  8.5      RADIOGRAPHIC STUDIES: I have personally reviewed the radiological images as listed and agreed with the findings in the report. No results found.  Orders Placed This Encounter  Procedures   CBC with Differential (Cancer Center Only)    Standing Status:   Future    Standing Expiration Date:   02/06/2023   Ferritin    Standing Status:   Standing    Number of Occurrences:   20    Standing Expiration Date:   02/06/2023   Occult blood card to lab, stool    Standing Status:   Future    Standing Expiration Date:   02/06/2023   Occult blood card to lab, stool    Standing Status:   Future    Standing Expiration Date:   02/06/2023   Occult blood card to lab, stool    Standing Status:   Future    Standing Expiration Date:   02/06/2023    All questions were answered. The patient knows to call the clinic with any problems, questions or concerns. The total time spent in the appointment was 30 minutes.     02/08/2023, MD 02/05/2022 8:23 PM  I, Malachy Mood, am acting as scribe for 02/07/2022, MD.   I have reviewed the above documentation for accuracy and completeness, and I agree with the above.

## 2022-02-09 ENCOUNTER — Other Ambulatory Visit: Payer: Self-pay

## 2022-02-09 NOTE — Progress Notes (Signed)
Epic faxed Dr. Latanya Maudlin last office note to pt's PCP Dr. Particia Nearing and pt's cardiologist Dr.Nishan.  Epic fax confirmation received.

## 2022-02-16 ENCOUNTER — Other Ambulatory Visit: Payer: Self-pay

## 2022-02-16 DIAGNOSIS — D500 Iron deficiency anemia secondary to blood loss (chronic): Secondary | ICD-10-CM | POA: Diagnosis not present

## 2022-02-16 LAB — OCCULT BLOOD X 1 CARD TO LAB, STOOL
Fecal Occult Bld: NEGATIVE
Fecal Occult Bld: NEGATIVE
Fecal Occult Bld: NEGATIVE

## 2022-02-19 ENCOUNTER — Other Ambulatory Visit: Payer: Self-pay

## 2022-02-19 ENCOUNTER — Inpatient Hospital Stay: Payer: Medicaid Other

## 2022-02-19 DIAGNOSIS — D500 Iron deficiency anemia secondary to blood loss (chronic): Secondary | ICD-10-CM | POA: Diagnosis not present

## 2022-02-19 LAB — CBC WITH DIFFERENTIAL (CANCER CENTER ONLY)
Abs Immature Granulocytes: 0.01 10*3/uL (ref 0.00–0.07)
Basophils Absolute: 0.1 10*3/uL (ref 0.0–0.1)
Basophils Relative: 1 %
Eosinophils Absolute: 0.1 10*3/uL (ref 0.0–0.5)
Eosinophils Relative: 2 %
HCT: 27 % — ABNORMAL LOW (ref 36.0–46.0)
Hemoglobin: 7.9 g/dL — ABNORMAL LOW (ref 12.0–15.0)
Immature Granulocytes: 0 %
Lymphocytes Relative: 43 %
Lymphs Abs: 2.1 10*3/uL (ref 0.7–4.0)
MCH: 19.9 pg — ABNORMAL LOW (ref 26.0–34.0)
MCHC: 29.3 g/dL — ABNORMAL LOW (ref 30.0–36.0)
MCV: 68.2 fL — ABNORMAL LOW (ref 80.0–100.0)
Monocytes Absolute: 0.5 10*3/uL (ref 0.1–1.0)
Monocytes Relative: 10 %
Neutro Abs: 2.1 10*3/uL (ref 1.7–7.7)
Neutrophils Relative %: 44 %
Platelet Count: 257 10*3/uL (ref 150–400)
RBC: 3.96 MIL/uL (ref 3.87–5.11)
RDW: 19.2 % — ABNORMAL HIGH (ref 11.5–15.5)
Smear Review: NORMAL
WBC Count: 4.8 10*3/uL (ref 4.0–10.5)
nRBC: 0 % (ref 0.0–0.2)

## 2022-02-19 LAB — FERRITIN: Ferritin: 2 ng/mL — ABNORMAL LOW (ref 11–307)

## 2022-02-21 ENCOUNTER — Other Ambulatory Visit: Payer: Self-pay | Admitting: Hematology

## 2022-02-21 DIAGNOSIS — D500 Iron deficiency anemia secondary to blood loss (chronic): Secondary | ICD-10-CM | POA: Insufficient documentation

## 2022-02-22 ENCOUNTER — Other Ambulatory Visit: Payer: Self-pay

## 2022-02-22 ENCOUNTER — Telehealth: Payer: Self-pay | Admitting: Hematology

## 2022-02-22 NOTE — Telephone Encounter (Signed)
.  Called patient to schedule appointment per 7/3 inbasket, patient is aware of date and time.   

## 2022-02-25 ENCOUNTER — Inpatient Hospital Stay: Payer: Medicaid Other | Attending: Hematology

## 2022-02-25 VITALS — BP 131/93 | HR 71 | Temp 98.0°F | Resp 20 | Wt 181.6 lb

## 2022-02-25 DIAGNOSIS — D500 Iron deficiency anemia secondary to blood loss (chronic): Secondary | ICD-10-CM | POA: Insufficient documentation

## 2022-02-25 MED ORDER — SODIUM CHLORIDE 0.9 % IV SOLN: Freq: Once | INTRAVENOUS | Status: AC

## 2022-02-25 MED ORDER — SODIUM CHLORIDE 0.9 % IV SOLN
200.0000 mg | Freq: Once | INTRAVENOUS | Status: AC
  Administered 2022-02-25: 200 mg via INTRAVENOUS
  Filled 2022-02-25: qty 10

## 2022-02-25 MED ORDER — LORATADINE 10 MG PO TABS
10.0000 mg | ORAL_TABLET | Freq: Every day | ORAL | Status: DC
  Administered 2022-02-25: 10 mg via ORAL
  Filled 2022-02-25: qty 1

## 2022-02-25 NOTE — Patient Instructions (Signed)

## 2022-03-11 ENCOUNTER — Inpatient Hospital Stay: Payer: Medicaid Other

## 2022-03-11 ENCOUNTER — Other Ambulatory Visit: Payer: Self-pay

## 2022-03-11 VITALS — BP 121/71 | HR 75 | Temp 98.1°F | Resp 18

## 2022-03-11 DIAGNOSIS — D500 Iron deficiency anemia secondary to blood loss (chronic): Secondary | ICD-10-CM

## 2022-03-11 MED ORDER — SODIUM CHLORIDE 0.9 % IV SOLN: Freq: Once | INTRAVENOUS | Status: AC

## 2022-03-11 MED ORDER — LORATADINE 10 MG PO TABS
10.0000 mg | ORAL_TABLET | Freq: Every day | ORAL | Status: DC
  Administered 2022-03-11: 10 mg via ORAL
  Filled 2022-03-11: qty 1

## 2022-03-11 MED ORDER — SODIUM CHLORIDE 0.9 % IV SOLN
200.0000 mg | Freq: Once | INTRAVENOUS | Status: AC
  Administered 2022-03-11: 200 mg via INTRAVENOUS
  Filled 2022-03-11: qty 200

## 2022-03-17 ENCOUNTER — Other Ambulatory Visit: Payer: Self-pay

## 2022-03-17 DIAGNOSIS — D500 Iron deficiency anemia secondary to blood loss (chronic): Secondary | ICD-10-CM

## 2022-03-18 ENCOUNTER — Inpatient Hospital Stay: Payer: Medicaid Other

## 2022-03-18 ENCOUNTER — Other Ambulatory Visit: Payer: Self-pay

## 2022-03-18 VITALS — BP 125/80 | HR 69 | Temp 97.9°F | Resp 18

## 2022-03-18 DIAGNOSIS — D500 Iron deficiency anemia secondary to blood loss (chronic): Secondary | ICD-10-CM

## 2022-03-18 LAB — CBC WITH DIFFERENTIAL (CANCER CENTER ONLY)
Abs Immature Granulocytes: 0 10*3/uL (ref 0.00–0.07)
Basophils Absolute: 0.1 10*3/uL (ref 0.0–0.1)
Basophils Relative: 1 %
Eosinophils Absolute: 0.1 10*3/uL (ref 0.0–0.5)
Eosinophils Relative: 3 %
HCT: 31 % — ABNORMAL LOW (ref 36.0–46.0)
Hemoglobin: 9.3 g/dL — ABNORMAL LOW (ref 12.0–15.0)
Immature Granulocytes: 0 %
Lymphocytes Relative: 40 %
Lymphs Abs: 1.5 10*3/uL (ref 0.7–4.0)
MCH: 21.5 pg — ABNORMAL LOW (ref 26.0–34.0)
MCHC: 30 g/dL (ref 30.0–36.0)
MCV: 71.6 fL — ABNORMAL LOW (ref 80.0–100.0)
Monocytes Absolute: 0.3 10*3/uL (ref 0.1–1.0)
Monocytes Relative: 9 %
Neutro Abs: 1.8 10*3/uL (ref 1.7–7.7)
Neutrophils Relative %: 47 %
Platelet Count: 228 10*3/uL (ref 150–400)
RBC: 4.33 MIL/uL (ref 3.87–5.11)
RDW: 24.7 % — ABNORMAL HIGH (ref 11.5–15.5)
WBC Count: 3.7 10*3/uL — ABNORMAL LOW (ref 4.0–10.5)
nRBC: 0 % (ref 0.0–0.2)

## 2022-03-18 MED ORDER — LORATADINE 10 MG PO TABS
10.0000 mg | ORAL_TABLET | Freq: Every day | ORAL | Status: DC
  Administered 2022-03-18: 10 mg via ORAL
  Filled 2022-03-18: qty 1

## 2022-03-18 MED ORDER — SODIUM CHLORIDE 0.9 % IV SOLN
200.0000 mg | Freq: Once | INTRAVENOUS | Status: AC
  Administered 2022-03-18: 200 mg via INTRAVENOUS
  Filled 2022-03-18: qty 200

## 2022-03-18 MED ORDER — SODIUM CHLORIDE 0.9 % IV SOLN: Freq: Once | INTRAVENOUS | Status: AC

## 2022-03-18 NOTE — Progress Notes (Signed)
Patient tolerated her iron well today. Patient has had iron previously and done well, so patient chose to refuse her 30 minute observation period today. No issues- VSS BP 125/80 (BP Location: Left Arm, Patient Position: Sitting)   Pulse 69   Temp 97.9 F (36.6 C) (Oral)   Resp 18   LMP 03/04/2022 (Exact Date)   SpO2 100%

## 2022-03-18 NOTE — Patient Instructions (Signed)

## 2022-03-19 ENCOUNTER — Other Ambulatory Visit: Payer: Medicaid Other

## 2022-03-19 LAB — FERRITIN: Ferritin: 27 ng/mL (ref 11–307)

## 2022-03-25 ENCOUNTER — Other Ambulatory Visit: Payer: Self-pay

## 2022-03-25 ENCOUNTER — Inpatient Hospital Stay: Payer: Medicaid Other | Attending: Hematology

## 2022-03-25 VITALS — BP 140/77 | HR 67 | Temp 98.8°F | Resp 18

## 2022-03-25 DIAGNOSIS — D500 Iron deficiency anemia secondary to blood loss (chronic): Secondary | ICD-10-CM | POA: Diagnosis present

## 2022-03-25 MED ORDER — LORATADINE 10 MG PO TABS
10.0000 mg | ORAL_TABLET | Freq: Every day | ORAL | Status: DC
  Administered 2022-03-25: 10 mg via ORAL
  Filled 2022-03-25: qty 1

## 2022-03-25 MED ORDER — SODIUM CHLORIDE 0.9 % IV SOLN
200.0000 mg | Freq: Once | INTRAVENOUS | Status: AC
  Administered 2022-03-25: 200 mg via INTRAVENOUS
  Filled 2022-03-25: qty 200

## 2022-03-25 MED ORDER — SODIUM CHLORIDE 0.9 % IV SOLN: Freq: Once | INTRAVENOUS | Status: AC

## 2022-03-25 NOTE — Patient Instructions (Signed)

## 2022-03-25 NOTE — Progress Notes (Signed)
Patient tolerated her iron well today- chose not to stay for her 30 minute observation. VSS- BP (!) 140/77 (BP Location: Left Arm, Patient Position: Sitting)   Pulse 67   Temp 98.8 F (37.1 C) (Oral)   Resp 18   LMP 03/04/2022 (Exact Date)   SpO2 100%

## 2022-04-01 ENCOUNTER — Other Ambulatory Visit: Payer: Self-pay

## 2022-04-01 ENCOUNTER — Inpatient Hospital Stay: Payer: Medicaid Other

## 2022-04-01 VITALS — BP 139/92 | HR 68 | Temp 98.3°F | Resp 16

## 2022-04-01 DIAGNOSIS — D500 Iron deficiency anemia secondary to blood loss (chronic): Secondary | ICD-10-CM

## 2022-04-01 MED ORDER — SODIUM CHLORIDE 0.9% FLUSH: 10.0000 mL | Freq: Once | INTRAVENOUS | Status: DC | PRN

## 2022-04-01 MED ORDER — HEPARIN SOD (PORK) LOCK FLUSH 100 UNIT/ML IV SOLN: 500.0000 [IU] | Freq: Once | INTRAVENOUS | Status: DC | PRN

## 2022-04-01 MED ORDER — SODIUM CHLORIDE 0.9 % IV SOLN: Freq: Once | INTRAVENOUS | Status: AC

## 2022-04-01 MED ORDER — ALTEPLASE 2 MG IJ SOLR: 2.0000 mg | Freq: Once | INTRAMUSCULAR | Status: DC | PRN

## 2022-04-01 MED ORDER — HEPARIN SOD (PORK) LOCK FLUSH 100 UNIT/ML IV SOLN: 250.0000 [IU] | Freq: Once | INTRAVENOUS | Status: DC | PRN

## 2022-04-01 MED ORDER — SODIUM CHLORIDE 0.9% FLUSH: 3.0000 mL | Freq: Once | INTRAVENOUS | Status: DC | PRN

## 2022-04-01 MED ORDER — SODIUM CHLORIDE 0.9 % IV SOLN
200.0000 mg | Freq: Once | INTRAVENOUS | Status: AC
  Administered 2022-04-01: 200 mg via INTRAVENOUS
  Filled 2022-04-01: qty 200

## 2022-04-01 MED ORDER — LORATADINE 10 MG PO TABS
10.0000 mg | ORAL_TABLET | Freq: Every day | ORAL | Status: DC
  Administered 2022-04-01: 10 mg via ORAL

## 2022-04-01 NOTE — Progress Notes (Signed)
Patient tolerated her iron well today. Has had iron previously- no issues. VSS- BP (!) 139/92 (BP Location: Right Arm, Patient Position: Sitting)   Pulse 68   Temp 98.3 F (36.8 C) (Oral)   Resp 16   LMP 03/04/2022 (Exact Date)   SpO2 100%  Chose not to stay for her 30 minute observation.

## 2022-04-01 NOTE — Patient Instructions (Signed)

## 2022-04-22 ENCOUNTER — Inpatient Hospital Stay: Payer: Medicaid Other

## 2022-04-22 ENCOUNTER — Other Ambulatory Visit: Payer: Self-pay

## 2022-04-22 DIAGNOSIS — D500 Iron deficiency anemia secondary to blood loss (chronic): Secondary | ICD-10-CM | POA: Diagnosis not present

## 2022-04-22 LAB — CBC WITH DIFFERENTIAL (CANCER CENTER ONLY)
Abs Immature Granulocytes: 0.01 10*3/uL (ref 0.00–0.07)
Basophils Absolute: 0.1 10*3/uL (ref 0.0–0.1)
Basophils Relative: 1 %
Eosinophils Absolute: 0.1 10*3/uL (ref 0.0–0.5)
Eosinophils Relative: 3 %
HCT: 39 % (ref 36.0–46.0)
Hemoglobin: 12.4 g/dL (ref 12.0–15.0)
Immature Granulocytes: 0 %
Lymphocytes Relative: 40 %
Lymphs Abs: 2.1 10*3/uL (ref 0.7–4.0)
MCH: 25.2 pg — ABNORMAL LOW (ref 26.0–34.0)
MCHC: 31.8 g/dL (ref 30.0–36.0)
MCV: 79.3 fL — ABNORMAL LOW (ref 80.0–100.0)
Monocytes Absolute: 0.4 10*3/uL (ref 0.1–1.0)
Monocytes Relative: 8 %
Neutro Abs: 2.5 10*3/uL (ref 1.7–7.7)
Neutrophils Relative %: 48 %
Platelet Count: 208 10*3/uL (ref 150–400)
RBC: 4.92 MIL/uL (ref 3.87–5.11)
WBC Count: 5.2 10*3/uL (ref 4.0–10.5)
nRBC: 0 % (ref 0.0–0.2)

## 2022-04-23 LAB — FERRITIN: Ferritin: 23 ng/mL (ref 11–307)

## 2022-05-12 ENCOUNTER — Inpatient Hospital Stay (HOSPITAL_BASED_OUTPATIENT_CLINIC_OR_DEPARTMENT_OTHER): Payer: Medicaid Other | Admitting: Hematology

## 2022-05-12 ENCOUNTER — Other Ambulatory Visit: Payer: Self-pay

## 2022-05-12 ENCOUNTER — Encounter: Payer: Self-pay | Admitting: Hematology

## 2022-05-12 ENCOUNTER — Inpatient Hospital Stay: Payer: Medicaid Other | Attending: Hematology

## 2022-05-12 VITALS — BP 144/92 | HR 60 | Temp 98.3°F | Resp 14 | Wt 200.7 lb

## 2022-05-12 DIAGNOSIS — D500 Iron deficiency anemia secondary to blood loss (chronic): Secondary | ICD-10-CM | POA: Diagnosis present

## 2022-05-12 DIAGNOSIS — Z7901 Long term (current) use of anticoagulants: Secondary | ICD-10-CM | POA: Insufficient documentation

## 2022-05-12 DIAGNOSIS — N920 Excessive and frequent menstruation with regular cycle: Secondary | ICD-10-CM | POA: Insufficient documentation

## 2022-05-12 DIAGNOSIS — I4891 Unspecified atrial fibrillation: Secondary | ICD-10-CM | POA: Insufficient documentation

## 2022-05-12 DIAGNOSIS — I10 Essential (primary) hypertension: Secondary | ICD-10-CM | POA: Insufficient documentation

## 2022-05-12 DIAGNOSIS — F17210 Nicotine dependence, cigarettes, uncomplicated: Secondary | ICD-10-CM | POA: Diagnosis not present

## 2022-05-12 LAB — CBC WITH DIFFERENTIAL (CANCER CENTER ONLY)
Abs Immature Granulocytes: 0.01 10*3/uL (ref 0.00–0.07)
Basophils Absolute: 0.1 10*3/uL (ref 0.0–0.1)
Basophils Relative: 1 %
Eosinophils Absolute: 0.2 10*3/uL (ref 0.0–0.5)
Eosinophils Relative: 3 %
HCT: 37.9 % (ref 36.0–46.0)
Hemoglobin: 12.2 g/dL (ref 12.0–15.0)
Immature Granulocytes: 0 %
Lymphocytes Relative: 40 %
Lymphs Abs: 2.1 10*3/uL (ref 0.7–4.0)
MCH: 26.9 pg (ref 26.0–34.0)
MCHC: 32.2 g/dL (ref 30.0–36.0)
MCV: 83.5 fL (ref 80.0–100.0)
Monocytes Absolute: 0.3 10*3/uL (ref 0.1–1.0)
Monocytes Relative: 5 %
Neutro Abs: 2.6 10*3/uL (ref 1.7–7.7)
Neutrophils Relative %: 51 %
Platelet Count: 195 10*3/uL (ref 150–400)
RBC: 4.54 MIL/uL (ref 3.87–5.11)
RDW: 26 % — ABNORMAL HIGH (ref 11.5–15.5)
WBC Count: 5.3 10*3/uL (ref 4.0–10.5)
nRBC: 0 % (ref 0.0–0.2)

## 2022-05-12 LAB — FERRITIN: Ferritin: 7 ng/mL — ABNORMAL LOW (ref 11–307)

## 2022-05-12 NOTE — Progress Notes (Signed)
[facility]   Telephone:(336) [phone] Fax:(336) [phone]   Clinic Follow up Note   Patient Care Team: Juanda Chance as PCP - General (Physician Assistant) Josue Hector, MD as PCP - Cardiology (Cardiology) Juanda Chance (Physician Assistant)  Date of Service:  05/12/2022  CHIEF COMPLAINT: f/u of iron deficient anemia  CURRENT THERAPY:  IV Venofer as needed if ferritin<20   ASSESSMENT & PLAN:  Marilyn Norman is a 31 y.o. female with   1. 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 She has had three children, did not experience any abnormal bleeding or required iron or blood transfusion during her pregnancy. -hgb was found to be low at 8.9 in ED on 09/05/21. Labs with her cardiologist on 01/08/22 showed decrease to 8. -iron panel 01/14/22 showed serum iron 10 and saturation 3%.,  Consistent with iron deficiency.  -Her new iron deficient anemia is probably related to her Xarelto, which she has been on for 4 to 5 years for A-fib. Xarelto was discontinued, and she was placed on oral iron on 01/19/22 by her PCP. -she is asymptomatic and denies any other noticeable bleeding. -fecal occult cards were all negative. -she received 5 doses IV Venofer 02/2022, last 04/01/22. -lab reviewed, her CBC is now WNL, except RDW 26%. Ferritin is pending, last on 04/22/22 was 23. If she needs more IV iron, we will give her one high dose. I encouraged her to reach out to [place] if she develops more symptoms.   2. A.fib, history of cardiomyopathy -diagnosed 5 years ago (~2018) -was started on Xarelto, now off due to anemia   3. Smoking Cessation -she currently smokes 3-4 cigarettes a day -I reviewed the risk of lung cancer with her today and encouraged her to quit.     PLAN: -lab in 3 and 6 months -f/u in 6 months -her ferritin was 7 today, I will schedule 2  doses of venofer 400mg     No problem-specific Assessment & Plan notes found for this encounter.   INTERVAL HISTORY:  Marilyn Norman is here for a follow up of anemia. She was last seen by me on 02/05/22 in consultation. She presents to the clinic alone. She reports she is feeling good. She notes her energy has improved. She notes she continues to smoke but is cutting back.   All other systems were reviewed with the patient and are negative.  MEDICAL HISTORY:  Past Medical History:  Diagnosis Date   A-fib [facility])    Cardiomegaly    Chest pain    Chlamydia    Costochondritis    Graves' eye disease    Hypertension    Hyperthyroidism    Pre-eclampsia    with each pregnancy   Thyroid disease    hyperthyroid   Trichomonas infection     SURGICAL HISTORY: Past Surgical History:  Procedure Laterality Date   CESAREAN SECTION     CESAREAN SECTION WITH BILATERAL TUBAL LIGATION N/A 01/22/2014   Procedure: REPEAT CESAREAN SECTION WITH BILATERAL TUBAL LIGATION;  Surgeon: Frederico Hamman, MD;  Location: [place];  Service: Obstetrics;  Laterality: N/A;   IUD REMOVAL     TUBAL LIGATION      I have reviewed the social history and family history with the patient and they are unchanged from previous note.  ALLERGIES:  is allergic to strawberry  extract.  MEDICATIONS:  Current Outpatient Medications  Medication Sig Dispense Refill   acetaminophen (TYLENOL) 500 MG tablet Take 1,000 mg by mouth every 6 (six) hours as needed for mild pain, moderate pain, fever or headache.     amLODipine (NORVASC) 10 MG tablet Take 1 tablet (10 mg total) by mouth daily. 90 tablet 3   lisinopril (ZESTRIL) 20 MG tablet TAKE 1 TABLET 2 (TWO) TIMES DAILY. 90 tablet 3   methimazole (TAPAZOLE) 10 MG tablet Take 5 mg by mouth daily.     metoprolol (TOPROL-XL) 200 MG 24 hr tablet Take 0.5 tablets (100 mg total) by mouth daily. Take with or immediately following a meal. 45 tablet 3   No current  facility-administered medications for this visit.    PHYSICAL EXAMINATION: ECOG PERFORMANCE STATUS: 0 - Asymptomatic  Vitals:   05/12/22 1345  BP: (!) 144/92  Pulse: 60  Resp: 14  Temp: 98.3 F (36.8 C)  SpO2: 100%   Wt Readings from Last 3 Encounters:  05/12/22 200 lb 11.2 oz (91 kg)  02/25/22 181 lb 9.6 oz (82.4 kg)  02/05/22 180 lb 11.2 oz (82 kg)     GENERAL:alert, no distress and comfortable SKIN: skin color normal, no rashes or significant lesions EYES: normal, Conjunctiva are pink and non-injected, sclera clear  NEURO: alert & oriented x 3 with fluent speech  LABORATORY DATA:  I have reviewed the data as listed    Latest Ref Rng & Units 05/12/2022    1:23 PM 04/22/2022    2:52 PM 03/18/2022    2:44 PM  CBC  WBC 4.0 - 10.5 K/uL 5.3  5.2  3.7   Hemoglobin 12.0 - 15.0 g/dL 12.2  12.4  9.3   Hematocrit 36.0 - 46.0 % 37.9  39.0  31.0   Platelets 150 - 400 K/uL 195  208  228         Latest Ref Rng & Units 09/05/2021    8:15 AM 03/30/2019    2:25 PM 09/08/2018   10:43 AM  CMP  Glucose 70 - 99 mg/dL 106  84  91   BUN 6 - 20 mg/dL 7  9  7    Creatinine 0.44 - 1.00 mg/dL 0.72  0.67  0.74   Sodium 135 - 145 mmol/L 136  139  141   Potassium 3.5 - 5.1 mmol/L 4.0  4.1  4.1   Chloride 98 - 111 mmol/L 107  109  110   CO2 22 - 32 mmol/L 20  22  23    Calcium 8.9 - 10.3 mg/dL 9.0  8.6  8.5       RADIOGRAPHIC STUDIES: I have personally reviewed the radiological images as listed and agreed with the findings in the report. No results found.    No orders of the defined types were placed in this encounter.  All questions were answered. The patient knows to call the clinic with any problems, questions or concerns. No barriers to learning was detected. The total time spent in the appointment was 25 minutes.     Truitt Merle, MD 05/12/2022   I, Wilburn Mylar, am acting as scribe for Truitt Merle, MD.   I have reviewed the above documentation for accuracy and completeness,  and I agree with the above.

## 2022-06-03 ENCOUNTER — Inpatient Hospital Stay: Payer: Medicaid Other | Attending: Hematology

## 2022-06-03 VITALS — BP 157/98 | HR 65 | Temp 98.7°F | Resp 19

## 2022-06-03 DIAGNOSIS — D500 Iron deficiency anemia secondary to blood loss (chronic): Secondary | ICD-10-CM | POA: Diagnosis present

## 2022-06-03 DIAGNOSIS — N920 Excessive and frequent menstruation with regular cycle: Secondary | ICD-10-CM | POA: Diagnosis not present

## 2022-06-03 MED ORDER — LORATADINE 10 MG PO TABS
10.0000 mg | ORAL_TABLET | Freq: Every day | ORAL | Status: DC
  Administered 2022-06-03: 10 mg via ORAL
  Filled 2022-06-03: qty 1

## 2022-06-03 MED ORDER — SODIUM CHLORIDE 0.9 % IV SOLN: Freq: Once | INTRAVENOUS | Status: AC

## 2022-06-03 MED ORDER — SODIUM CHLORIDE 0.9 % IV SOLN
400.0000 mg | Freq: Once | INTRAVENOUS | Status: AC
  Administered 2022-06-03: 400 mg via INTRAVENOUS
  Filled 2022-06-03: qty 20

## 2022-06-03 MED ORDER — CLONIDINE HCL 0.1 MG PO TABS
0.1000 mg | ORAL_TABLET | Freq: Once | ORAL | Status: AC
  Administered 2022-06-03: 0.1 mg via ORAL
  Filled 2022-06-03: qty 1

## 2022-06-03 NOTE — Patient Instructions (Signed)

## 2022-06-03 NOTE — Progress Notes (Signed)
Upon VS check after completion of venofer pt bp noted to be elevated. Pt c/o dizziness, states that her bp runs elevated some and she had taken her bp medication this morning. Dr Burr Medico notified, 0.1mg  clonidine ordered. Pt observed for additional 30 min, pt reported feeling back to baseline and dizziness had resolved. Per Dr Burr Medico ok for discharge, pt to check bp at home and follow up with PCP. Pt aware and agrees.

## 2022-06-10 ENCOUNTER — Inpatient Hospital Stay: Payer: Medicaid Other

## 2022-06-10 VITALS — BP 120/82 | HR 63 | Temp 98.2°F | Resp 18 | Ht 67.0 in | Wt 203.5 lb

## 2022-06-10 DIAGNOSIS — D500 Iron deficiency anemia secondary to blood loss (chronic): Secondary | ICD-10-CM | POA: Diagnosis not present

## 2022-06-10 MED ORDER — LORATADINE 10 MG PO TABS
10.0000 mg | ORAL_TABLET | Freq: Once | ORAL | Status: AC
  Administered 2022-06-10: 10 mg via ORAL
  Filled 2022-06-10: qty 1

## 2022-06-10 MED ORDER — SODIUM CHLORIDE 0.9 % IV SOLN
400.0000 mg | Freq: Once | INTRAVENOUS | Status: AC
  Administered 2022-06-10: 400 mg via INTRAVENOUS
  Filled 2022-06-10: qty 20

## 2022-06-10 MED ORDER — SODIUM CHLORIDE 0.9 % IV SOLN: Freq: Once | INTRAVENOUS | Status: AC

## 2022-06-10 NOTE — Patient Instructions (Signed)

## 2022-06-10 NOTE — Progress Notes (Signed)
Pt declined to stay for 30-minute post-observation after iron infusion. VSS at discharge

## 2022-06-28 NOTE — Progress Notes (Signed)
Date:  07/12/2022   ID:  Marilyn Norman, DOB Jan 19, 1991, MRN [id number]  PCP:  Barbarann Ehlers  Cardiologist:  Eden Emms Electrophysiologist:  None   Evaluation Performed:  Follow-Up Visit  Chief Complaint:  PAF/ DCM  History of Present Illness:    31 y.o. who I haven't seen in person since 2015 Telehealth visit 2020. 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   June 2023 had anemia with Hct 27 Ferritin 05/12/22 low at 7 Started on iron Thyroid studies Normal 12/2021 Xarelto dc with this and new anemia Seen by Dr Terrace [facility] Iron deficiency anemia due to menorrhagia Fecal blood negative Received IV Venofer between July and August as well   Smoking 3-4 cigarettes/day    BP borderline 150/80 on repeat discussed f/u with primary and checking at home May need to start diuretic Mother Weyman Pedro born 25 is a patient of ours as well   Past Medical History:  Diagnosis Date   A-fib [facility])    Cardiomegaly    Chest pain    Chlamydia    Costochondritis    Graves' eye disease    Hypertension    Hyperthyroidism    Pre-eclampsia    with each pregnancy   Thyroid disease    hyperthyroid   Trichomonas infection    Past Surgical History:  Procedure Laterality Date   CESAREAN SECTION     CESAREAN SECTION WITH BILATERAL TUBAL LIGATION N/A 01/22/2014   Procedure: REPEAT CESAREAN SECTION WITH BILATERAL TUBAL LIGATION;  Surgeon: Kathreen Cosier, MD;  Location: [facility];  Service: Obstetrics;  Laterality: N/A;   IUD REMOVAL     TUBAL LIGATION       Current Meds  Medication Sig   acetaminophen (TYLENOL) 500 MG tablet Take 1,000 mg by mouth every 6 (six)  hours as needed for mild pain, moderate pain, fever or headache.   amLODipine (NORVASC) 10 MG tablet Take 1 tablet (10 mg total) by mouth daily.   lisinopril (ZESTRIL) 20 MG tablet TAKE 1 TABLET 2 (TWO) TIMES DAILY.   methimazole (TAPAZOLE) 10 MG tablet Take 5 mg by mouth daily.   metoprolol (TOPROL-XL) 200 MG 24 hr tablet Take 0.5 tablets (100 mg total) by mouth daily. Take with or immediately following a meal.     Allergies:   Strawberry extract   Social History   Tobacco Use   Smoking status: Every Day    Packs/day: 0.25    Years: 15.00    Total pack years: 3.75    Types: Cigarettes    Last attempt to quit: 10/19/2013    Years since quitting: 8.7   Smokeless tobacco: Never  Vaping Use   Vaping Use: Never used  Substance Use Topics   Alcohol use: No   Drug use: Yes    Types: Marijuana    Comment: 01/18/14     Family Hx: The patient's family history includes Asthma in her mother; Heart disease in her father.  ROS:   Please see the history of present illness.     All other systems reviewed and are negative.   Prior CV studies:   The  following studies were reviewed today:  TTE 01/01/19  Labs/Other Tests and Data Reviewed:    EKG:   SR rate 56 poor R wave progression 09/09/18  Recent Labs: 09/05/2021: BUN 7; Creatinine, Ser 0.72; Potassium 4.0; Sodium 136 05/12/2022: Hemoglobin 12.2; Platelet Count 195   Recent Lipid Panel No results found for: "CHOL", "TRIG", "HDL", "CHOLHDL", "LDLCALC", "LDLDIRECT"  Wt Readings from Last 3 Encounters:  07/12/22 204 lb 12.8 oz (92.9 kg)  06/10/22 203 lb 8 oz (92.3 kg)  05/12/22 200 lb 11.2 oz (91 kg)     Objective:    Vital Signs:  BP (!) 156/84   Pulse 73   Ht 5\' 7"  (1.702 m)   Wt 204 lb 12.8 oz (92.9 kg)   SpO2 99%   BMI 32.08 kg/m    Affect appropriate Healthy:  appears stated age HEENT: normal Neck supple with no adenopathy JVP normal no bruits no thyromegaly Lungs clear with no wheezing and good diaphragmatic  motion Heart:  S1/S2 no murmur, no rub, gallop or click PMI normal Abdomen: benighn, BS positve, no tenderness, no AAA no bruit.  No HSM or HJR Distal pulses intact with no bruits No edema Neuro non-focal Skin warm and dry No muscular weakness   ASSESSMENT & PLAN:    PAF: in setting of hyperthyroidism Continue metoprolol post ablative Rx now needs TSH checked T3T4 ECG NSR normal 01/08/22  HTN:  Poor diet and some compliance issues continue current meds  DCM:  Likely related to post partum and thyroid disease with PAF. EF normalized by TTE 01/01/19 will update  MVP  Anterior leaflet mild MR by echo 01/01/19   Thyroid:  Check TSH/T4 an T3   Iron Deficiency Anemia:  improved with Fe Rx iv/po ? From menorrhagia F/U Krista Blue  COVID-19 Education: The signs and symptoms of COVID-19 were discussed with the patient and how to seek care for testing (follow up with PCP or arrange E-visit).  The importance of social distancing was discussed today.  Time:   Today, I have spent 30 minutes with the patient with telehealth technology discussing the above problems.     Medication Adjustments/Labs and Tests Ordered: Current medicines are reviewed at length with the patient today.  Concerns regarding medicines are outlined above.   Tests Ordered:  TSH, T4 T3 Echo  Anemia labs with Dr Krista Blue on f/u   Medication Changes: No orders of the defined types were placed in this encounter.   Disposition:  Follow up in a year  Signed, Jenkins Rouge, MD  07/12/2022 10:32 AM    [place]

## 2022-07-12 ENCOUNTER — Ambulatory Visit: Payer: Medicaid Other | Attending: Cardiovascular Disease | Admitting: Cardiovascular Disease

## 2022-07-12 ENCOUNTER — Encounter: Payer: Self-pay | Admitting: Cardiovascular Disease

## 2022-07-12 VITALS — BP 156/84 | HR 73 | Ht 67.0 in | Wt 204.8 lb

## 2022-07-12 DIAGNOSIS — I341 Nonrheumatic mitral (valve) prolapse: Secondary | ICD-10-CM

## 2022-07-12 DIAGNOSIS — I428 Other cardiomyopathies: Secondary | ICD-10-CM | POA: Diagnosis not present

## 2022-07-12 DIAGNOSIS — I480 Paroxysmal atrial fibrillation: Secondary | ICD-10-CM | POA: Diagnosis not present

## 2022-07-12 DIAGNOSIS — E0590 Thyrotoxicosis, unspecified without thyrotoxic crisis or storm: Secondary | ICD-10-CM | POA: Diagnosis not present

## 2022-07-12 NOTE — Patient Instructions (Signed)
Medication Instructions:  Your physician recommends that you continue on your current medications as directed. Please refer to the Current Medication list given to you today.  *If you need a refill on your cardiac medications before your next appointment, please call your pharmacy*  Lab Work: Your physician recommends that you have lab work today- TSH, T4, T3  If you have labs (blood work) drawn today and your tests are completely normal, you will receive your results only by: MyChart Message (if you have MyChart) OR A paper copy in the mail If you have any lab test that is abnormal or we need to change your treatment, we will call you to review the results.  Testing/Procedures: Your physician has requested that you have an echocardiogram. Echocardiography is a painless test that uses sound waves to create images of your heart. It provides your doctor with information about the size and shape of your heart and how well your heart's chambers and valves are working. This procedure takes approximately one hour. There are no restrictions for this procedure. Please do NOT wear cologne, perfume, aftershave, or lotions (deodorant is allowed). Please arrive 15 minutes prior to your appointment time.  Follow-Up: At [facility], you and your health needs are our priority.  As part of our continuing mission to provide you with exceptional heart care, we have created designated Provider Care Teams.  These Care Teams include your primary Cardiologist (physician) and Advanced Practice Providers (APPs -  Physician Assistants and Nurse Practitioners) who all work together to provide you with the care you need, when you need it.  We recommend signing up for the patient portal called "MyChart".  Sign up information is provided on this After Visit Summary.  MyChart is used to connect with patients for Virtual Visits (Telemedicine).  Patients are able to view lab/test results, encounter notes, upcoming  appointments, etc.  Non-urgent messages can be sent to your provider as well.   To learn more about what you can do with MyChart, go to [email or web address].    Your next appointment:   1 year(s)  The format for your next appointment:   In Person  Provider:   Charlton Haws, MD       Important Information About Sugar

## 2022-07-13 LAB — TSH: TSH: 1.09 u[IU]/mL (ref 0.450–4.500)

## 2022-07-13 LAB — T4, FREE: Free T4: 1.13 ng/dL (ref 0.82–1.77)

## 2022-07-13 LAB — T3, FREE: T3, Free: 3 pg/mL (ref 2.0–4.4)

## 2022-08-05 ENCOUNTER — Ambulatory Visit (HOSPITAL_COMMUNITY): Payer: Medicaid Other | Attending: Cardiovascular Disease

## 2022-08-05 DIAGNOSIS — I4891 Unspecified atrial fibrillation: Secondary | ICD-10-CM | POA: Diagnosis not present

## 2022-08-05 DIAGNOSIS — I428 Other cardiomyopathies: Secondary | ICD-10-CM | POA: Insufficient documentation

## 2022-08-05 DIAGNOSIS — I083 Combined rheumatic disorders of mitral, aortic and tricuspid valves: Secondary | ICD-10-CM

## 2022-08-05 DIAGNOSIS — I480 Paroxysmal atrial fibrillation: Secondary | ICD-10-CM | POA: Insufficient documentation

## 2022-08-05 DIAGNOSIS — I341 Nonrheumatic mitral (valve) prolapse: Secondary | ICD-10-CM | POA: Diagnosis present

## 2022-08-05 DIAGNOSIS — E0590 Thyrotoxicosis, unspecified without thyrotoxic crisis or storm: Secondary | ICD-10-CM | POA: Diagnosis present

## 2022-08-05 LAB — ECHOCARDIOGRAM COMPLETE
Area-P 1/2: 3.31 cm2
MV M vel: 5.63 m/s
MV Peak grad: 126.8 mmHg
Radius: 0.4 cm
S' Lateral: 3.4 cm

## 2022-08-06 ENCOUNTER — Telehealth: Payer: Self-pay | Admitting: Cardiovascular Disease

## 2022-08-06 NOTE — Telephone Encounter (Signed)
Patient is returning call to discuss echo results. °

## 2022-08-09 NOTE — Telephone Encounter (Signed)
The patient has been notified of the result and verbalized understanding.  All questions (if any) were answered. Ethelda Chick, RN 08/09/2022 1:51 PM

## 2022-08-11 ENCOUNTER — Other Ambulatory Visit: Payer: Self-pay

## 2022-08-11 ENCOUNTER — Inpatient Hospital Stay: Payer: Medicaid Other | Attending: Hematology

## 2022-11-01 ENCOUNTER — Emergency Department (HOSPITAL_COMMUNITY): Payer: Medicaid Other

## 2022-11-01 ENCOUNTER — Telehealth: Payer: Self-pay | Admitting: Cardiovascular Disease

## 2022-11-01 ENCOUNTER — Emergency Department (HOSPITAL_COMMUNITY)
Admission: EM | Admit: 2022-11-01 | Discharge: 2022-11-01 | Disposition: A | Discharge status: Home / Self Care | Payer: Medicaid Other | Attending: Emergency Medicine | Admitting: Emergency Medicine

## 2022-11-01 ENCOUNTER — Other Ambulatory Visit: Payer: Self-pay

## 2022-11-01 ENCOUNTER — Encounter (HOSPITAL_COMMUNITY): Payer: Self-pay | Admitting: Emergency Medicine

## 2022-11-01 DIAGNOSIS — R079 Chest pain, unspecified: Secondary | ICD-10-CM | POA: Diagnosis not present

## 2022-11-01 DIAGNOSIS — I509 Heart failure, unspecified: Secondary | ICD-10-CM | POA: Insufficient documentation

## 2022-11-01 DIAGNOSIS — R0602 Shortness of breath: Secondary | ICD-10-CM | POA: Diagnosis not present

## 2022-11-01 DIAGNOSIS — I110 Hypertensive heart disease with heart failure: Secondary | ICD-10-CM | POA: Insufficient documentation

## 2022-11-01 DIAGNOSIS — M549 Dorsalgia, unspecified: Secondary | ICD-10-CM | POA: Insufficient documentation

## 2022-11-01 DIAGNOSIS — Z79899 Other long term (current) drug therapy: Secondary | ICD-10-CM | POA: Diagnosis not present

## 2022-11-01 DIAGNOSIS — I4891 Unspecified atrial fibrillation: Secondary | ICD-10-CM | POA: Diagnosis not present

## 2022-11-01 DIAGNOSIS — R911 Solitary pulmonary nodule: Secondary | ICD-10-CM

## 2022-11-01 LAB — URINALYSIS, ROUTINE W REFLEX MICROSCOPIC
Bilirubin Urine: NEGATIVE
Glucose, UA: NEGATIVE mg/dL
Ketones, ur: 20 mg/dL — AB
Leukocytes,Ua: NEGATIVE
Nitrite: NEGATIVE
Protein, ur: NEGATIVE mg/dL
Specific Gravity, Urine: >1.046 — ABNORMAL HIGH (ref 1.005–1.030)
pH: 6 (ref 5.0–8.0)

## 2022-11-01 LAB — TROPONIN I (HIGH SENSITIVITY)
Troponin I (High Sensitivity): 3 ng/L
Troponin I (High Sensitivity): 6 ng/L

## 2022-11-01 LAB — HEPATIC FUNCTION PANEL
ALT: 14 U/L (ref 0–44)
AST: 22 U/L (ref 15–41)
Albumin: 3.7 g/dL (ref 3.5–5.0)
Alkaline Phosphatase: 69 U/L (ref 38–126)
Bilirubin, Direct: 0.1 mg/dL (ref 0.0–0.2)
Indirect Bilirubin: 1.2 mg/dL — ABNORMAL HIGH (ref 0.3–0.9)
Total Bilirubin: 1.3 mg/dL — ABNORMAL HIGH (ref 0.3–1.2)
Total Protein: 7.2 g/dL (ref 6.5–8.1)

## 2022-11-01 LAB — CBC WITH DIFFERENTIAL/PLATELET
Abs Immature Granulocytes: 0.02 10*3/uL (ref 0.00–0.07)
Basophils Absolute: 0.1 10*3/uL (ref 0.0–0.1)
Basophils Relative: 1 %
Eosinophils Absolute: 0.2 10*3/uL (ref 0.0–0.5)
Eosinophils Relative: 3 %
HCT: 42 % (ref 36.0–46.0)
Hemoglobin: 13.9 g/dL (ref 12.0–15.0)
Immature Granulocytes: 0 %
Lymphocytes Relative: 30 %
Lymphs Abs: 1.8 10*3/uL (ref 0.7–4.0)
MCH: 31.2 pg (ref 26.0–34.0)
MCHC: 33.1 g/dL (ref 30.0–36.0)
MCV: 94.4 fL (ref 80.0–100.0)
Monocytes Absolute: 0.4 10*3/uL (ref 0.1–1.0)
Monocytes Relative: 7 %
Neutro Abs: 3.6 10*3/uL (ref 1.7–7.7)
Neutrophils Relative %: 59 %
Platelets: 207 10*3/uL (ref 150–400)
RBC: 4.45 MIL/uL (ref 3.87–5.11)
RDW: 13.6 % (ref 11.5–15.5)
WBC: 6.1 10*3/uL (ref 4.0–10.5)
nRBC: 0 % (ref 0.0–0.2)

## 2022-11-01 LAB — LIPASE, BLOOD: Lipase: 29 U/L (ref 11–51)

## 2022-11-01 LAB — MAGNESIUM: Magnesium: 1.9 mg/dL (ref 1.7–2.4)

## 2022-11-01 LAB — D-DIMER, QUANTITATIVE: D-Dimer, Quant: <0.27 ug/mL-FEU (ref 0.00–0.50)

## 2022-11-01 LAB — I-STAT BETA HCG BLOOD, ED (MC, WL, AP ONLY): I-stat hCG, quantitative: <5 m[IU]/mL

## 2022-11-01 LAB — BASIC METABOLIC PANEL
Anion gap: 9 (ref 5–15)
BUN: 8 mg/dL (ref 6–20)
CO2: 20 mmol/L — ABNORMAL LOW (ref 22–32)
Calcium: 8.7 mg/dL — ABNORMAL LOW (ref 8.9–10.3)
Chloride: 109 mmol/L (ref 98–111)
Creatinine, Ser: 0.77 mg/dL (ref 0.44–1.00)
GFR, Estimated: >60 mL/min (ref >60)
Glucose, Bld: 87 mg/dL (ref 70–99)
Potassium: 3.8 mmol/L (ref 3.5–5.1)
Sodium: 138 mmol/L (ref 135–145)

## 2022-11-01 LAB — TSH: TSH: 6.286 u[IU]/mL — ABNORMAL HIGH (ref 0.350–4.500)

## 2022-11-01 LAB — BRAIN NATRIURETIC PEPTIDE: B Natriuretic Peptide: 24.9 pg/mL (ref 0.0–100.0)

## 2022-11-01 LAB — CBG MONITORING, ED: Glucose-Capillary: 88 mg/dL (ref 70–99)

## 2022-11-01 MED ORDER — METOPROLOL SUCCINATE ER 25 MG PO TB24
100.0000 mg | ORAL_TABLET | Freq: Every day | ORAL | Status: DC
  Administered 2022-11-01: 100 mg via ORAL
  Filled 2022-11-01: qty 4

## 2022-11-01 MED ORDER — FENTANYL CITRATE PF 50 MCG/ML IJ SOSY
50.0000 ug | PREFILLED_SYRINGE | Freq: Once | INTRAMUSCULAR | Status: AC
  Administered 2022-11-01: 50 ug via INTRAVENOUS
  Filled 2022-11-01: qty 1

## 2022-11-01 MED ORDER — LISINOPRIL 20 MG PO TABS
20.0000 mg | ORAL_TABLET | Freq: Once | ORAL | Status: AC
  Administered 2022-11-01: 20 mg via ORAL
  Filled 2022-11-01: qty 1

## 2022-11-01 MED ORDER — NITROGLYCERIN 2 % TD OINT
1.0000 [in_us] | TOPICAL_OINTMENT | Freq: Once | TRANSDERMAL | Status: AC
  Administered 2022-11-01: 1 [in_us] via TOPICAL
  Filled 2022-11-01: qty 1

## 2022-11-01 MED ORDER — AMLODIPINE BESYLATE 5 MG PO TABS
10.0000 mg | ORAL_TABLET | Freq: Every day | ORAL | Status: DC
  Administered 2022-11-01: 10 mg via ORAL
  Filled 2022-11-01: qty 2

## 2022-11-01 MED ORDER — ASPIRIN 81 MG PO CHEW
324.0000 mg | CHEWABLE_TABLET | Freq: Once | ORAL | Status: AC
  Administered 2022-11-01: 324 mg via ORAL
  Filled 2022-11-01: qty 4

## 2022-11-01 MED ORDER — IOHEXOL 350 MG/ML SOLN
75.0000 mL | Freq: Once | INTRAVENOUS | Status: AC | PRN
  Administered 2022-11-01: 75 mL via INTRAVENOUS

## 2022-11-01 NOTE — ED Provider Notes (Signed)
[facility] Provider Note   CSN: [id number] Arrival date & time: 11/01/22  [id number]     History {Add pertinent medical, surgical, social history, OB history to HPI:1} Chief Complaint  Patient presents with   Chest Pain   Shortness of Breath    Marilyn Norman is a 32 y.o. female.   Chest Pain Associated symptoms: back pain and shortness of breath   Shortness of Breath Associated symptoms: chest pain   Patient presents for chest pain.  Medical history includes HTN, CHF, atrial fibrillation, anemia.  Over the past 2 days, she has had pain that is located in left side of chest, left shoulder, and left-sided back.  It is worse with deep inspiration.  She states that she has had similar symptoms in the past which she attributed to her atrial fibrillation.  Patient is not currently on a blood thinner.  She did not take her blood pressure medicines today.      Home Medications Prior to Admission medications   Medication Sig Start Date End Date Taking? Authorizing Provider  acetaminophen (TYLENOL) 500 MG tablet Take 1,000 mg by mouth every 6 (six) hours as needed for mild pain, moderate pain, fever or headache.    [provider]  amLODipine (NORVASC) 10 MG tablet Take 1 tablet (10 mg total) by mouth daily. 01/08/22   Swinyer, Lanice Schwab, NP  lisinopril (ZESTRIL) 20 MG tablet TAKE 1 TABLET 2 (TWO) TIMES DAILY. 01/08/22 01/09/23  Swinyer, Lanice Schwab, NP  methimazole (TAPAZOLE) 10 MG tablet Take 5 mg by mouth daily. 01/18/19   [provider]  metoprolol (TOPROL-XL) 200 MG 24 hr tablet Take 0.5 tablets (100 mg total) by mouth daily. Take with or immediately following a meal. 01/08/22   Swinyer, Lanice Schwab, NP      Allergies    Strawberry extract    Review of Systems   Review of Systems  Respiratory:  Positive for shortness of breath.   Cardiovascular:  Positive for chest pain.  Musculoskeletal:  Positive for back pain.  All  other systems reviewed and are negative.   Physical Exam Updated Vital Signs BP (!) 176/101 (BP Location: Right Arm)   Pulse 76   Temp 98.1 F (36.7 C) (Oral)   Resp (!) 26   Ht '5\' 7"'$  (1.702 m)   Wt 95.3 kg   LMP 10/23/2022   SpO2 100%   BMI 32.89 kg/m  Physical Exam Vitals and nursing note reviewed.  Constitutional:      General: She is not in acute distress.    Appearance: She is well-developed. She is not ill-appearing, toxic-appearing or diaphoretic.  HENT:     Head: Normocephalic and atraumatic.  Eyes:     Conjunctiva/sclera: Conjunctivae normal.  Cardiovascular:     Rate and Rhythm: Normal rate and regular rhythm.     Heart sounds: No murmur heard. Pulmonary:     Effort: Pulmonary effort is normal. No respiratory distress.     Breath sounds: Normal breath sounds. No decreased breath sounds, wheezing, rhonchi or rales.  Chest:     Chest wall: No tenderness.  Abdominal:     Palpations: Abdomen is soft.     Tenderness: There is no abdominal tenderness.  Musculoskeletal:        General: No swelling.     Cervical back: Normal range of motion and neck supple.     Right lower leg: No edema.     Left lower leg:  No edema.  Skin:    General: Skin is warm and dry.     Coloration: Skin is not cyanotic or pale.  Neurological:     General: No focal deficit present.     Mental Status: She is alert and oriented to person, place, and time.  Psychiatric:        Mood and Affect: Mood normal.        Behavior: Behavior normal.     ED Results / Procedures / Treatments   Labs (all labs ordered are listed, but only abnormal results are displayed) Labs Reviewed - No data to display  EKG EKG Interpretation  Date/Time:  Monday November 01 2022 10:04:22 EDT Ventricular Rate:  70 PR Interval:  193 QRS Duration: 90 QT Interval:  419 QTC Calculation: 453 R Axis:   94 Text Interpretation: Sinus rhythm Borderline right axis deviation Probable anteroseptal infarct, old Confirmed  by Godfrey Pick [phone]) on 11/01/2022 10:23:22 AM  Radiology No results found.  Procedures Procedures  {Document cardiac monitor, telemetry assessment procedure when appropriate:1}  Medications Ordered in ED Medications - No data to display  ED Course/ Medical Decision Making/ A&P   {   Click here for ABCD2, HEART and other calculatorsREFRESH Note before signing :1}                          Medical Decision Making Amount and/or Complexity of Data Reviewed Labs: ordered. Radiology: ordered.  Risk OTC drugs. Prescription drug management.   This patient presents to the ED for concern of ***, this involves an extensive number of treatment options, and is a complaint that carries with it a high risk of complications and morbidity.  The differential diagnosis includes ***   Co morbidities that complicate the patient evaluation  ***   Additional history obtained:  Additional history obtained from *** External records from outside source obtained and reviewed including ***   Lab Tests:  I Ordered, and personally interpreted labs.  The pertinent results include:  ***   Imaging Studies ordered:  I ordered imaging studies including ***  I independently visualized and interpreted imaging which showed *** I agree with the radiologist interpretation   Cardiac Monitoring: / EKG:  The patient was maintained on a cardiac monitor.  I personally viewed and interpreted the cardiac monitored which showed an underlying rhythm of: ***   Consultations Obtained:  I requested consultation with the ***,  and discussed lab and imaging findings as well as pertinent plan - they recommend: ***   Problem List / ED Course / Critical interventions / Medication management  *** I ordered medication including ***  for ***  Reevaluation of the patient after these medicines showed that the patient {resolved/improved/worsened:23923::"improved"} I have reviewed the patients home medicines and  have made adjustments as needed   Social Determinants of Health:  ***   Test / Admission - Considered:  ***   {Document critical care time when appropriate:1} {Document review of labs and clinical decision tools ie heart score, Chads2Vasc2 etc:1}  {Document your independent review of radiology images, and any outside records:1} {Document your discussion with family members, caretakers, and with consultants:1} {Document social determinants of health affecting pt's care:1} {Document your decision making why or why not admission, treatments were needed:1} Final Clinical Impression(s) / ED Diagnoses Final diagnoses:  None    Rx / DC Orders ED Discharge Orders     None

## 2022-11-01 NOTE — ED Triage Notes (Signed)
Pt reports left sided chest pain radiating to left arm and back. Pt reports symptoms began two days ago and became worse last night.

## 2022-11-01 NOTE — Telephone Encounter (Signed)
Pt called stating she is currently experiencing chest pain, shortness of breath, since this past Saturday. Since today those symptoms are worsening, she is experiencing new symptoms as this morning left arm tingling and swelling, and seeing spots. Advised the patient to immediately seek medical attention or call 911. Patient stated her boyfriend is going to take her to [facility] ED. Spoke with [phone] the patient to go to the nearest ED for further evaluation. Patient voiced understanding.

## 2022-11-01 NOTE — Telephone Encounter (Signed)
   Pt c/o of Chest Pain: STAT if CP now or developed within 24 hours  1. Are you having CP right now? Yes   2. Are you experiencing any other symptoms (ex. SOB, nausea, vomiting, sweating)? SOB   3. How long have you been experiencing CP? Saturday   4. Is your CP continuous or coming and going?   5. Have you taken Nitroglycerin? No   Pt said, she started sharp CP and SOB since Saturday and its getting worst ?

## 2022-11-01 NOTE — Discharge Instructions (Signed)
You may take ibuprofen, Advil, Aleve, or any other NSAID to help with your pain.  You may also take Tylenol.  Follow-up with your family doctor in regards to your chest pain.  Your CT scan shows that you have a nodule or spot on your right lung.  While this does not seem to be causing your symptoms, this will need follow-up given your history of smoking.  Follow-up with your doctor and let them know about this so they can arrange follow-up testing as necessary.  If you develop recurrent, continued, or worsening chest pain, shortness of breath, fever, vomiting, abdominal or back pain, or any other new/concerning symptoms then return to the ER for evaluation.

## 2022-11-01 NOTE — ED Provider Notes (Signed)
Care transferred to me.  CTA shows a small pulmonary nodule though this is not in the area of her pain.  I doubt this is causing her symptoms.  Could be infectious per radiology but she has a chronic but unchanged cough.  I doubt this is a pneumonia.  I do not think antibiotics are warranted.  A second troponin is been sent though her symptoms have been for 48+ hours.  I talked to patient and she does not want to wait on the results.  Given the combined low suspicion for ACS I think this is reasonable.  Will send home and recommend NSAIDs as well as follow-up in regards to the pulmonary nodule with her PCP.   Sherwood Gambler, MD 11/01/22 [phone]

## 2022-11-10 ENCOUNTER — Inpatient Hospital Stay: Payer: Medicaid Other | Attending: Hematology

## 2022-11-10 ENCOUNTER — Inpatient Hospital Stay: Payer: Medicaid Other | Admitting: Hematology

## 2022-11-10 NOTE — Progress Notes (Deleted)
[name]   [phone] [phone]   Clinic Follow up Note   Patient Care Team: Juanda Chance as PCP - General (Physician Assistant) Josue Hector, MD as PCP - Cardiology (Cardiology) Juanda Chance (Physician Assistant)  Date of Service:  11/10/2022  CHIEF COMPLAINT: f/u of  iron deficient anemia   CURRENT THERAPY:  IV Venofer as needed if ferritin<20    ASSESSMENT: *** Marilyn Norman is a 32 y.o. female with   No problem-specific Assessment & Plan notes found for this encounter.  ***   PLAN:         INTERVAL HISTORY: *** Marilyn Norman is here for a follow up of  iron deficient anemia  She was last seen by me on 05/12/2022. She presents to the clinic     All other systems were reviewed with the patient and are negative.  MEDICAL HISTORY:  Past Medical History:  Diagnosis Date   A-fib [facility])    Cardiomegaly    Chest pain    Chlamydia    Costochondritis    Graves' eye disease    Hypertension    Hyperthyroidism    Pre-eclampsia    with each pregnancy   Thyroid disease    hyperthyroid   Trichomonas infection     SURGICAL HISTORY: Past Surgical History:  Procedure Laterality Date   CESAREAN SECTION     CESAREAN SECTION WITH BILATERAL TUBAL LIGATION N/A 01/22/2014   Procedure: REPEAT CESAREAN SECTION WITH BILATERAL TUBAL LIGATION;  Surgeon: Frederico Hamman, MD;  Location: [facility];  Service: Obstetrics;  Laterality: N/A;   IUD REMOVAL     TUBAL LIGATION      I have reviewed the social history and family history with the patient and they are unchanged from previous note.  ALLERGIES:  is allergic to strawberry extract.  MEDICATIONS:  Current Outpatient Medications  Medication Sig Dispense Refill   acetaminophen (TYLENOL) 500 MG tablet Take 1,000 mg by mouth every 6 (six) hours as needed for mild pain, moderate pain, fever or headache.     amLODipine (NORVASC) 10 MG tablet Take 1 tablet (10  mg total) by mouth daily. 90 tablet 3   lisinopril (ZESTRIL) 20 MG tablet TAKE 1 TABLET 2 (TWO) TIMES DAILY. 90 tablet 3   methimazole (TAPAZOLE) 10 MG tablet Take 5 mg by mouth daily.     metoprolol (TOPROL-XL) 200 MG 24 hr tablet Take 0.5 tablets (100 mg total) by mouth daily. Take with or immediately following a meal. 45 tablet 3   No current facility-administered medications for this visit.    PHYSICAL EXAMINATION: ECOG PERFORMANCE STATUS: {CHL ONC ECOG PS:[id number]}  There were no vitals filed for this visit. Wt Readings from Last 3 Encounters:  11/01/22 210 lb (95.3 kg)  07/12/22 204 lb 12.8 oz (92.9 kg)  06/10/22 203 lb 8 oz (92.3 kg)    {Only keep what was examined. If exam not performed, can use .CEXAM } GENERAL:alert, no distress and comfortable SKIN: skin color, texture, turgor are normal, no rashes or significant lesions EYES: normal, Conjunctiva are pink and non-injected, sclera clear {OROPHARYNX:no exudate, no erythema and lips, buccal mucosa, and tongue normal}  NECK: supple, thyroid normal size, non-tender, without nodularity LYMPH:  no palpable lymphadenopathy in the cervical, axillary {or inguinal} LUNGS: clear to auscultation and percussion with normal breathing effort HEART: regular rate & rhythm and no murmurs and no lower extremity edema ABDOMEN:abdomen soft, non-tender and normal bowel  sounds Musculoskeletal:no cyanosis of digits and no clubbing  NEURO: alert & oriented x 3 with fluent speech, no focal motor/sensory deficits  LABORATORY DATA:  I have reviewed the data as listed    Latest Ref Rng & Units 11/01/2022   10:29 AM 05/12/2022    1:23 PM 04/22/2022    2:52 PM  CBC  WBC 4.0 - 10.5 K/uL 6.1  5.3  5.2   Hemoglobin 12.0 - 15.0 g/dL 13.9  12.2  12.4   Hematocrit 36.0 - 46.0 % 42.0  37.9  39.0   Platelets 150 - 400 K/uL 207  195  208         Latest Ref Rng & Units 11/01/2022   10:29 AM 09/05/2021    8:15 AM 03/30/2019    2:25 PM  CMP  Glucose 70  - 99 mg/dL 87  106  84   BUN 6 - 20 mg/dL 8  7  9    Creatinine 0.44 - 1.00 mg/dL 0.77  0.72  0.67   Sodium 135 - 145 mmol/L 138  136  139   Potassium 3.5 - 5.1 mmol/L 3.8  4.0  4.1   Chloride 98 - 111 mmol/L 109  107  109   CO2 22 - 32 mmol/L 20  20  22    Calcium 8.9 - 10.3 mg/dL 8.7  9.0  8.6   Total Protein 6.5 - 8.1 g/dL 7.2     Total Bilirubin 0.3 - 1.2 mg/dL 1.3     Alkaline Phos 38 - 126 U/L 69     AST 15 - 41 U/L 22     ALT 0 - 44 U/L 14         RADIOGRAPHIC STUDIES: I have personally reviewed the radiological images as listed and agreed with the findings in the report. No results found.    No orders of the defined types were placed in this encounter.  All questions were answered. The patient knows to call the clinic with any problems, questions or concerns. No barriers to learning was detected. The total time spent in the appointment was {CHL ONC TIME VISIT - [id number].     Baldemar Friday, CMA 11/10/2022   I, Audry Riles, CMA, am acting as scribe for Truitt Merle, MD.   {Add scribe attestation statement}

## 2022-11-10 NOTE — Assessment & Plan Note (Deleted)
due to menorrhagia  -she has a history of heavy periods since she was a teenager but has never been diagnosed with anemia before recently. She has had three children, did not experience any abnormal bleeding or required iron or blood transfusion during her pregnancy. -hgb was found to be low at 8.9 in ED on 09/05/21. Labs with her cardiologist on 01/08/22 showed decrease to 8. -iron panel 01/14/22 showed serum iron 10 and saturation 3%.,  Consistent with iron deficiency.  -Her new iron deficient anemia is probably related to her Xarelto, which she has been on for 4 to 5 years for A-fib. Xarelto was discontinued, and she was placed on oral iron on 01/19/22 by her PCP. -she is asymptomatic and denies any other noticeable bleeding. -fecal occult cards were all negative. -she received 5 doses IV Venofer in June and August of 2023. Anemia subsequently resolved. -Will continue monitoring her lab, and give IV iron as needed.

## 2022-11-10 NOTE — Assessment & Plan Note (Deleted)
-  diagnosed 5 years ago (~2018) -was started on Xarelto, now off due to anemia -Follow-up with cardiology

## 2023-01-18 ENCOUNTER — Telehealth: Payer: Self-pay

## 2023-01-18 NOTE — Telephone Encounter (Signed)
-----   Message from Jennifer Witty, RMA sent at 01/18/2023  6:38 AM EDT -----  ----- Message ----- From: Nishan, Peter C, MD Sent: 01/15/2023  11:39 AM EDT To: Jennifer Witty, RMA  Needs f/u with primary for lung nodule and smoking ----- Message ----- From: Witty, Jennifer, RMA Sent: 01/12/2023   7:42 AM EDT To: Peter C Nishan, MD; Hershey Knauer Pate Ingalls, RN    

## 2023-01-18 NOTE — Telephone Encounter (Signed)
Patient stated she has follow-up with her PCP.

## 2023-01-18 NOTE — Telephone Encounter (Deleted)
-----   Message from Marilyn Norman, [place] sent at 01/18/2023  6:38 AM EDT -----  ----- Message ----- From: Wendall Stade, MD Sent: 01/15/2023  11:39 AM EDT To: Marilyn Norman, RMA  Needs f/u with primary for lung nodule and smoking ----- Message ----- From: Marilyn Norman, RMA Sent: 01/12/2023   7:42 AM EDT To: Wendall Stade, MD; Ethelda Chick, RN

## 2023-02-01 ENCOUNTER — Telehealth (HOSPITAL_COMMUNITY): Payer: Self-pay | Admitting: Emergency Medicine

## 2023-02-01 ENCOUNTER — Ambulatory Visit (INDEPENDENT_AMBULATORY_CARE_PROVIDER_SITE_OTHER): Payer: Medicaid Other

## 2023-02-01 ENCOUNTER — Ambulatory Visit (HOSPITAL_COMMUNITY)
Admission: EM | Admit: 2023-02-01 | Discharge: 2023-02-01 | Disposition: A | Discharge status: Home / Self Care | Payer: Medicaid Other | Attending: Emergency Medicine | Admitting: Emergency Medicine

## 2023-02-01 ENCOUNTER — Encounter (HOSPITAL_COMMUNITY): Payer: Self-pay | Admitting: Emergency Medicine

## 2023-02-01 ENCOUNTER — Other Ambulatory Visit: Payer: Self-pay

## 2023-02-01 DIAGNOSIS — R0602 Shortness of breath: Secondary | ICD-10-CM

## 2023-02-01 MED ORDER — FUROSEMIDE 20 MG PO TABS: 20.0000 mg | ORAL_TABLET | Freq: Every day | ORAL | 0 refills | Status: DC
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
## 2023-02-01 NOTE — ED Triage Notes (Signed)
Reportedly sob started yesterday evening.  This morning sob returned when she woke up.  Patient reports she was able to sleep last night.  PCP found nodules "left lung" per patient and the plan was to reevaluate at next appt.    Denies cold symptoms.

## 2023-02-01 NOTE — Discharge Instructions (Addendum)
Today you were evaluated for shortness of breath based on your examination I do not feel like your symptoms are emergent or in need of a hospital visit at this time  Chest x-ray is pending, you will be notified of results as soon as they are received, treatment will be decided based on your results and discussed when you were notified

## 2023-02-01 NOTE — ED Notes (Signed)
Breath sounds decreased.  Skin warm and dry.  Speaking in complete sentences

## 2023-02-01 NOTE — Telephone Encounter (Signed)
Chest x-ray shows mild vascular congestion, discussed findings with patient via telephone, 2 patient identifiers used, prescribed furosemide 20 mg daily for 5 days, given strict precautions for any worsening symptoms to go to the nearest emergency department, advised follow-up with cardiologist in 1 week

## 2023-07-29 ENCOUNTER — Encounter (HOSPITAL_COMMUNITY): Payer: Self-pay | Admitting: Emergency Medicine

## 2023-07-29 ENCOUNTER — Ambulatory Visit (HOSPITAL_COMMUNITY)
Admission: EM | Admit: 2023-07-29 | Discharge: 2023-07-29 | Disposition: A | Discharge status: Home / Self Care | Payer: Medicaid Other | Attending: Internal Medicine | Admitting: Internal Medicine

## 2023-07-29 DIAGNOSIS — N3000 Acute cystitis without hematuria: Secondary | ICD-10-CM | POA: Diagnosis present

## 2023-07-29 LAB — POCT URINALYSIS DIP (MANUAL ENTRY)
Bilirubin, UA: NEGATIVE
Glucose, UA: NEGATIVE mg/dL
Ketones, POC UA: NEGATIVE mg/dL
Nitrite, UA: NEGATIVE
Protein Ur, POC: 100 mg/dL — AB
Spec Grav, UA: 1.02 (ref 1.010–1.025)
Urobilinogen, UA: 1 U/dL
pH, UA: 6 (ref 5.0–8.0)

## 2023-07-29 MED ORDER — SULFAMETHOXAZOLE-TRIMETHOPRIM 800-160 MG PO TABS: 1.0000 | ORAL_TABLET | Freq: Two times a day (BID) | ORAL | 0 refills | Status: AC

## 2023-07-29 MED ORDER — PHENAZOPYRIDINE HCL 95 MG PO TABS: 95.0000 mg | ORAL_TABLET | Freq: Three times a day (TID) | ORAL | 0 refills | Status: DC | PRN

## 2023-07-29 NOTE — ED Triage Notes (Signed)
Pt c/o urinary frequency and burning after urinating that started on 11/28. States it got better but yesterday symptoms came back worse.

## 2023-07-29 NOTE — Discharge Instructions (Signed)
Take all antibiotics as prescribed and until finished, you can take them with food to prevent gastrointestinal upset.  The Pyridium will help with discomfort with urination, this may change the color of your urine and secretions.  Ensure you are drinking plenty of water, at least 64 ounces to help flush out your kidneys.  Your symptoms should improve with antibiotics.  If no improvement or any changes return to clinic.
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
## 2023-07-31 LAB — URINE CULTURE: Culture: >=100000 — AB

## 2023-11-24 ENCOUNTER — Other Ambulatory Visit: Payer: Self-pay

## 2023-11-24 ENCOUNTER — Emergency Department (HOSPITAL_COMMUNITY)
Admission: EM | Admit: 2023-11-24 | Discharge: 2023-11-24 | Disposition: A | Discharge status: Home / Self Care | Attending: Emergency Medicine | Admitting: Emergency Medicine

## 2023-11-24 ENCOUNTER — Emergency Department (HOSPITAL_COMMUNITY)

## 2023-11-24 DIAGNOSIS — R0602 Shortness of breath: Secondary | ICD-10-CM | POA: Diagnosis not present

## 2023-11-24 DIAGNOSIS — I10 Essential (primary) hypertension: Secondary | ICD-10-CM | POA: Insufficient documentation

## 2023-11-24 DIAGNOSIS — R071 Chest pain on breathing: Secondary | ICD-10-CM | POA: Insufficient documentation

## 2023-11-24 DIAGNOSIS — Z79899 Other long term (current) drug therapy: Secondary | ICD-10-CM | POA: Diagnosis not present

## 2023-11-24 DIAGNOSIS — R079 Chest pain, unspecified: Secondary | ICD-10-CM

## 2023-11-24 LAB — CBC WITH DIFFERENTIAL/PLATELET
Abs Immature Granulocytes: 0 10*3/uL (ref 0.00–0.07)
Basophils Absolute: 0.1 10*3/uL (ref 0.0–0.1)
Basophils Relative: 2 %
Eosinophils Absolute: 0.2 10*3/uL (ref 0.0–0.5)
Eosinophils Relative: 3 %
HCT: 37.8 % (ref 36.0–46.0)
Hemoglobin: 12.5 g/dL (ref 12.0–15.0)
Immature Granulocytes: 0 %
Lymphocytes Relative: 39 %
Lymphs Abs: 2.1 10*3/uL (ref 0.7–4.0)
MCH: 29.6 pg (ref 26.0–34.0)
MCHC: 33.1 g/dL (ref 30.0–36.0)
MCV: 89.4 fL (ref 80.0–100.0)
Monocytes Absolute: 0.5 10*3/uL (ref 0.1–1.0)
Monocytes Relative: 9 %
Neutro Abs: 2.6 10*3/uL (ref 1.7–7.7)
Neutrophils Relative %: 47 %
Platelets: 204 10*3/uL (ref 150–400)
RBC: 4.23 MIL/uL (ref 3.87–5.11)
RDW: 16.7 % — ABNORMAL HIGH (ref 11.5–15.5)
WBC: 5.5 10*3/uL (ref 4.0–10.5)
nRBC: 0 % (ref 0.0–0.2)

## 2023-11-24 LAB — BASIC METABOLIC PANEL WITH GFR
Anion gap: 9 (ref 5–15)
BUN: 5 mg/dL — ABNORMAL LOW (ref 6–20)
CO2: 21 mmol/L — ABNORMAL LOW (ref 22–32)
Calcium: 8.6 mg/dL — ABNORMAL LOW (ref 8.9–10.3)
Chloride: 109 mmol/L (ref 98–111)
Creatinine, Ser: 0.85 mg/dL (ref 0.44–1.00)
GFR, Estimated: >60 mL/min (ref >60)
Glucose, Bld: 88 mg/dL (ref 70–99)
Potassium: 3.6 mmol/L (ref 3.5–5.1)
Sodium: 139 mmol/L (ref 135–145)

## 2023-11-24 LAB — HCG, SERUM, QUALITATIVE: Preg, Serum: NEGATIVE

## 2023-11-24 NOTE — ED Triage Notes (Signed)
Patient reports SOB with chest congestion and productive cough onset yesterday .

## 2023-11-24 NOTE — Discharge Instructions (Addendum)
 Take your blood pressure medicines when you get home.  Your workup was reassuring today

## 2023-11-24 NOTE — ED Provider Notes (Signed)
 [place] EMERGENCY DEPARTMENT AT [facility] Provider Note   CSN: [id number] Arrival date & time: 11/24/23  0630     History  Chief Complaint  Patient presents with   Shortness of Breath    Marilyn Norman is a 33 y.o. female.   Shortness of Breath Patient with history of hypertension and A-fib.  Starting yesterday shortness of breath.  Occasional cough.  Pain on the chest with breathing.  No swelling in her legs.  Does have history of hypertension but did not take her medicines this morning.  No fevers.      Past Medical History:  Diagnosis Date   A-fib [facility])    Cardiomegaly    Chest pain    Chlamydia    Costochondritis    Graves' eye disease    Hypertension    Hyperthyroidism    Pre-eclampsia    with each pregnancy   Thyroid disease    hyperthyroid   Trichomonas infection     Home Medications Prior to Admission medications   Medication Sig Start Date End Date Taking? Authorizing Provider  acetaminophen (TYLENOL) 500 MG tablet Take 1,000 mg by mouth every 6 (six) hours as needed for mild pain, moderate pain, fever or headache.   Yes [provider]  lisinopril (ZESTRIL) 20 MG tablet TAKE 1 TABLET 2 (TWO) TIMES DAILY. 01/08/22 11/24/23 Yes Swinyer, Zachary George, NP  methimazole (TAPAZOLE) 10 MG tablet Take 5 mg by mouth daily. 01/18/19  Yes [provider]      Allergies    Strawberry extract    Review of Systems   Review of Systems  Respiratory:  Positive for shortness of breath.     Physical Exam Updated Vital Signs BP (!) 192/113 (BP Location: Right Arm)   Pulse 67   Temp 98.2 F (36.8 C)   Resp 16   SpO2 99%  Physical Exam Vitals and nursing note reviewed.  Cardiovascular:     Rate and Rhythm: Normal rate and regular rhythm.  Pulmonary:     Comments: Mildly harsh breath sounds. Chest:     Chest wall: Tenderness present.     Comments: Tenderness to right parasternal area. Musculoskeletal:     Right lower leg: No  tenderness.     Left lower leg: No tenderness.  Skin:    General: Skin is warm.  Neurological:     Mental Status: She is alert and oriented to person, place, and time.     ED Results / Procedures / Treatments   Labs (all labs ordered are listed, but only abnormal results are displayed) Labs Reviewed  CBC WITH DIFFERENTIAL/PLATELET - Abnormal; Notable for the following components:      Result Value   RDW 16.7 (*)    All other components within normal limits  BASIC METABOLIC PANEL WITH GFR - Abnormal; Notable for the following components:   CO2 21 (*)    BUN 5 (*)    Calcium 8.6 (*)    All other components within normal limits  HCG, SERUM, QUALITATIVE    EKG EKG Interpretation Date/Time:  Thursday November 24 2023 06:40:58 EDT Ventricular Rate:  67 PR Interval:  194 QRS Duration:  86 QT Interval:  440 QTC Calculation: 464 R Axis:   77  Text Interpretation: Normal sinus rhythm Possible Anterolateral infarct , age undetermined When compared with ECG of 01-Nov-2022 10:04, No significant change since last tracing Confirmed by Benjiman Core [phone]) on 11/24/2023 7:18:28 AM  Radiology DG Chest 2  View Result Date: 11/24/2023 CLINICAL DATA:  Shortness of breath and chest pain. EXAM: CHEST - 2 VIEW COMPARISON:  02/01/2023 FINDINGS: The lungs are clear without focal pneumonia, edema, pneumothorax or pleural effusion. Cardiopericardial silhouette is at upper limits of normal for size. No acute bony abnormality. IMPRESSION: No active cardiopulmonary disease. Electronically Signed   By: Kennith Center M.D.   On: 11/24/2023 07:12    Procedures Procedures    Medications Ordered in ED Medications - No data to display  ED Course/ Medical Decision Making/ A&P                                 Medical Decision Making Amount and/or Complexity of Data Reviewed Labs: ordered. Radiology: ordered.   Patient with occasional cough.  Right-sided chest pain.  Reproducible.  Differential  diagnosis includes cause such as acute coronary syndrome, pneumonia, pneumothorax.  EKG reassuring.  Do not think we need troponin.  Has reproducible pain which does increase likelihood of a chest wall pain.  Doubt pulmonary embolism.  History of A-fib but is in sinus rhythm.  X-ray does not show pneumonia.  Reviewed previous cardiology note.  Workup reassuring.  X-ray reassuring.  Doubt cardiac ischemia.  Will discharge home.         Final Clinical Impression(s) / ED Diagnoses Final diagnoses:  Nonspecific chest pain    Rx / DC Orders ED Discharge Orders     None         Benjiman Core, MD 11/24/23 1534

## 2024-03-29 ENCOUNTER — Ambulatory Visit (HOSPITAL_COMMUNITY): Admission: EM | Admit: 2024-03-29 | Discharge: 2024-03-29 | Disposition: A | Discharge status: Home / Self Care

## 2024-03-29 ENCOUNTER — Encounter (HOSPITAL_COMMUNITY): Payer: Self-pay

## 2024-03-29 DIAGNOSIS — T148XXA Other injury of unspecified body region, initial encounter: Secondary | ICD-10-CM

## 2024-03-29 NOTE — ED Provider Notes (Signed)
 [facility]  Note:  This document was prepared using Dragon voice recognition software and may include unintentional dictation errors.  MRN: [id number] DOB: 1991-03-02  Subjective:   Marilyn Norman is a 33 y.o. female presenting for knot to right upper arm x 2 weeks with mild discomfort with palpation.  Patient reports that she injured her right arm by hitting it on a metal object approximately 2 weeks ago had a large bruise but was not painful.  Patient now has a knot that is formed in the area which is palpable to the skin, mildly uncomfortable with palpation.  Has some mild numbness and tingling in the area.  Has not taken any over-the-counter medication or treatment for the symptoms.  Patient was concerned for possible blood clot.  No current facility-administered medications for this encounter. No current outpatient medications on file.   Allergies  Allergen Reactions   Strawberry Extract Anaphylaxis    Past Medical History:  Diagnosis Date   A-fib [facility])    Cardiomegaly    Chest pain    Chlamydia    Costochondritis    Graves' eye disease    Hypertension    Hyperthyroidism    Pre-eclampsia    with each pregnancy   Thyroid  disease    hyperthyroid   Trichomonas infection      Past Surgical History:  Procedure Laterality Date   CESAREAN SECTION     CESAREAN SECTION WITH BILATERAL TUBAL LIGATION N/A 01/22/2014   Procedure: REPEAT CESAREAN SECTION WITH BILATERAL TUBAL LIGATION;  Surgeon: Aida DELENA Na, MD;  Location: [facility];  Service: Obstetrics;  Laterality: N/A;   IUD REMOVAL     TUBAL LIGATION      Family History  Problem Relation Age of Onset   Asthma Mother    Heart disease Father     Social History   Tobacco Use   Smoking status: Every Day    Current packs/day: 0.00    Average packs/day: 0.3 packs/day for 15.0 years (3.8 ttl pk-yrs)    Types: Cigarettes    Start date: 10/19/1998    Last attempt to quit: 10/19/2013    Years since  quitting: 10.4   Smokeless tobacco: Never  Vaping Use   Vaping status: Never Used  Substance Use Topics   Alcohol use: Yes   Drug use: Yes    Types: Marijuana    Comment: 01/18/14    ROS Refer to HPI for ROS details.  Objective:   Vitals: BP (!) 149/91 (BP Location: Left Arm)   Pulse 69   Temp 97.9 F (36.6 C) (Oral)   Resp 18   Ht 5' 7 (1.702 m)   Wt 210 lb (95.3 kg)   LMP 03/13/2024 (Approximate)   SpO2 99%   BMI 32.89 kg/m   Physical Exam Vitals and nursing note reviewed.  Constitutional:      General: She is not in acute distress.    Appearance: Normal appearance. She is well-developed. She is not ill-appearing or toxic-appearing.  HENT:     Head: Normocephalic and atraumatic.  Cardiovascular:     Rate and Rhythm: Normal rate.  Pulmonary:     Effort: Pulmonary effort is normal. No respiratory distress.  Skin:    General: Skin is warm and dry.     Findings: Bruising present. No erythema or wound.  Neurological:     General: No focal deficit present.     Mental Status: She is alert and oriented to person, place, and time.  Psychiatric:        Mood and Affect: Mood normal.        Behavior: Behavior normal.     Procedures  No results found for this or any previous visit (from the past 24 hours).  No results found.   Assessment and Plan :     Discharge Instructions       1. Subcutaneous hematoma (Primary) - Apply warm compresses 2-3 times a day for 10 to 15 minutes at a time directly to the area of discomfort and swelling. - Apply gentle massage after removing heat to help body reabsorb hematoma. - Take ibuprofen  or Tylenol  as needed for pain secondary to subcutaneous hematoma. - Due to subcutaneous nature there is minimal concern for deep vein thrombosis or blood clot which may cause secondary health concerns. - Continue to monitor for any increase in swelling to extremity below site of hematoma, increased redness, increased pain, shortness of  breath, chest pain, weakness, dizziness. -Continue to monitor symptoms for any change in severity if there is any escalation of current symptoms or development of new symptoms follow-up in ER for further evaluation and management.      Tavari Loadholt B Jakeya Gherardi   Kalin Amrhein, [place], [place] 03/29/24 [phone]

## 2024-03-29 NOTE — ED Triage Notes (Signed)
 Patient presenting with knot on the right arm onset 2 weeks ago. States she did hit her arm on a sign that left a large bruise but it was not hurting. States 2 days after that the knot formed and has gotten larger and painful. Also having numbness and tingling in the right arm   Prescriptions or OTC medications tried: No

## 2024-03-29 NOTE — Discharge Instructions (Signed)
  1. Subcutaneous hematoma (Primary) - Apply warm compresses 2-3 times a day for 10 to 15 minutes at a time directly to the area of discomfort and swelling. - Apply gentle massage after removing heat to help body reabsorb hematoma. - Take ibuprofen  or Tylenol  as needed for pain secondary to subcutaneous hematoma. - Due to subcutaneous nature there is minimal concern for deep vein thrombosis or blood clot which may cause secondary health concerns. - Continue to monitor for any increase in swelling to extremity below site of hematoma, increased redness, increased pain, shortness of breath, chest pain, weakness, dizziness. -Continue to monitor symptoms for any change in severity if there is any escalation of current symptoms or development of new symptoms follow-up in ER for further evaluation and management.

## 2024-05-19 ENCOUNTER — Ambulatory Visit (HOSPITAL_COMMUNITY)
Admission: EM | Admit: 2024-05-19 | Discharge: 2024-05-19 | Disposition: A | Discharge status: Home / Self Care | Attending: Emergency Medicine | Admitting: Emergency Medicine

## 2024-05-19 ENCOUNTER — Encounter (HOSPITAL_COMMUNITY): Payer: Self-pay | Admitting: *Deleted

## 2024-05-19 ENCOUNTER — Other Ambulatory Visit: Payer: Self-pay

## 2024-05-19 DIAGNOSIS — R109 Unspecified abdominal pain: Secondary | ICD-10-CM

## 2024-05-19 LAB — POCT URINALYSIS DIP (MANUAL ENTRY)
Bilirubin, UA: NEGATIVE
Blood, UA: NEGATIVE
Glucose, UA: NEGATIVE mg/dL
Ketones, POC UA: NEGATIVE mg/dL
Leukocytes, UA: NEGATIVE
Nitrite, UA: NEGATIVE — NL
Protein Ur, POC: NEGATIVE mg/dL
Spec Grav, UA: 1.02 (ref 1.010–1.025)
Urobilinogen, UA: 1 U/dL
pH, UA: 6.5 (ref 5.0–8.0)

## 2024-05-19 NOTE — ED Triage Notes (Signed)
 PT reports for one week she has had back and Rt flank pain. Pt reports back hurts more when she voids.
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
## 2024-05-19 NOTE — ED Notes (Signed)
 Patient is being discharged from the Urgent Care and sent to the Emergency Department via private vehicle . Per provider, patient is in need of higher level of care due to flank pain. Patient is aware and verbalizes understanding of plan of care.  Vitals:   05/19/24 1035  BP: 132/87  Pulse: 67  Resp: 20  Temp: 98.3 F (36.8 C)  SpO2: 96%

## 2024-05-19 NOTE — Discharge Instructions (Addendum)
 Please go to [facility] Emergency department for evaluation of your flank pain.  [address] [place], [place]

## 2024-06-05 ENCOUNTER — Encounter (HOSPITAL_COMMUNITY): Payer: Self-pay

## 2024-06-05 ENCOUNTER — Ambulatory Visit (HOSPITAL_COMMUNITY)
Admission: EM | Admit: 2024-06-05 | Discharge: 2024-06-05 | Disposition: A | Discharge status: Home / Self Care | Attending: Family Medicine | Admitting: Family Medicine

## 2024-06-05 ENCOUNTER — Ambulatory Visit (INDEPENDENT_AMBULATORY_CARE_PROVIDER_SITE_OTHER)

## 2024-06-05 DIAGNOSIS — M79644 Pain in right finger(s): Secondary | ICD-10-CM

## 2024-06-05 MED ORDER — METHYLPREDNISOLONE 4 MG PO TBPK: ORAL_TABLET | ORAL | 0 refills | Status: AC

## 2024-06-05 NOTE — Discharge Instructions (Addendum)
 You were seen today for finger pain and swelling.  Your xray appears normal.  If the radiologist reads this differently we will call to notify you.  I am treating you with a course of steroids to help with pain and swelling, and a finger splint for comfort.  You may also use motrin /aleve  for pain.  If you have continued pain, then please follow up with an orthopedic hand specialist.  You may call [facility] at [phone].

## 2024-06-05 NOTE — ED Triage Notes (Signed)
 Patient reports that she has had right ring finger pain x 1 week. Patient denies any injury. Patient reports she began having right ring finger swelling since yesterday and is unable to make a fist. Pain has also increased since the swelling.  Patient states she has been taking Tylenol  for her right finger pain.
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
